# Patient Record
Sex: Female | Born: 1935 | Race: White | Hispanic: No | Marital: Married
Health system: Southern US, Community
[De-identification: ages and names within clinical notes are randomized; demographics above are authoritative.]

## PROBLEM LIST (undated history)

## (undated) DIAGNOSIS — N183 Chronic kidney disease, stage 3 unspecified: Secondary | ICD-10-CM

## (undated) DIAGNOSIS — I639 Cerebral infarction, unspecified: Secondary | ICD-10-CM

## (undated) DIAGNOSIS — Z9889 Other specified postprocedural states: Secondary | ICD-10-CM

## (undated) DIAGNOSIS — Z5189 Encounter for other specified aftercare: Secondary | ICD-10-CM

## (undated) DIAGNOSIS — H9190 Unspecified hearing loss, unspecified ear: Secondary | ICD-10-CM

## (undated) DIAGNOSIS — I10 Essential (primary) hypertension: Secondary | ICD-10-CM

## (undated) DIAGNOSIS — R112 Nausea with vomiting, unspecified: Secondary | ICD-10-CM

## (undated) DIAGNOSIS — E079 Disorder of thyroid, unspecified: Secondary | ICD-10-CM

## (undated) HISTORY — PX: THYROIDECTOMY, PARTIAL: SHX18

## (undated) HISTORY — PX: ABDOMINAL HYSTERECTOMY: SHX81

## (undated) HISTORY — DX: Unspecified hearing loss, unspecified ear: H91.90

---

## 1998-03-08 ENCOUNTER — Emergency Department (HOSPITAL_COMMUNITY): Admission: EM | Admit: 1998-03-08 | Discharge: 1998-03-08 | Payer: Self-pay

## 1998-03-09 ENCOUNTER — Ambulatory Visit (HOSPITAL_COMMUNITY): Admission: RE | Admit: 1998-03-09 | Discharge: 1998-03-09 | Payer: Self-pay | Admitting: Emergency Medicine

## 2001-06-15 ENCOUNTER — Other Ambulatory Visit: Admission: RE | Admit: 2001-06-15 | Discharge: 2001-06-15 | Payer: Self-pay | Admitting: Internal Medicine

## 2002-10-18 ENCOUNTER — Encounter: Admission: RE | Admit: 2002-10-18 | Discharge: 2002-10-18 | Payer: Self-pay | Admitting: Internal Medicine

## 2002-10-18 ENCOUNTER — Encounter: Payer: Self-pay | Admitting: Internal Medicine

## 2002-11-11 ENCOUNTER — Other Ambulatory Visit: Admission: RE | Admit: 2002-11-11 | Discharge: 2002-11-11 | Payer: Self-pay | Admitting: Internal Medicine

## 2002-11-22 ENCOUNTER — Encounter: Admission: RE | Admit: 2002-11-22 | Discharge: 2002-11-22 | Payer: Self-pay | Admitting: Internal Medicine

## 2002-11-22 ENCOUNTER — Encounter: Payer: Self-pay | Admitting: Internal Medicine

## 2003-11-06 ENCOUNTER — Encounter: Admission: RE | Admit: 2003-11-06 | Discharge: 2003-11-06 | Payer: Self-pay | Admitting: Orthopedic Surgery

## 2003-11-20 ENCOUNTER — Encounter: Admission: RE | Admit: 2003-11-20 | Discharge: 2003-11-20 | Payer: Self-pay | Admitting: Orthopedic Surgery

## 2006-01-07 ENCOUNTER — Emergency Department (HOSPITAL_COMMUNITY): Admission: EM | Admit: 2006-01-07 | Discharge: 2006-01-07 | Payer: Self-pay | Admitting: Emergency Medicine

## 2008-07-22 ENCOUNTER — Observation Stay (HOSPITAL_COMMUNITY): Admission: EM | Admit: 2008-07-22 | Discharge: 2008-07-23 | Payer: Self-pay | Admitting: Emergency Medicine

## 2008-07-27 ENCOUNTER — Emergency Department (HOSPITAL_COMMUNITY): Admission: EM | Admit: 2008-07-27 | Discharge: 2008-07-28 | Payer: Self-pay | Admitting: Emergency Medicine

## 2008-08-05 ENCOUNTER — Encounter: Admission: RE | Admit: 2008-08-05 | Discharge: 2008-08-05 | Payer: Self-pay | Admitting: Internal Medicine

## 2009-12-09 ENCOUNTER — Inpatient Hospital Stay (HOSPITAL_COMMUNITY): Admission: AD | Admit: 2009-12-09 | Discharge: 2009-12-12 | Payer: Self-pay | Admitting: Internal Medicine

## 2009-12-11 ENCOUNTER — Encounter (INDEPENDENT_AMBULATORY_CARE_PROVIDER_SITE_OTHER): Payer: Self-pay | Admitting: Internal Medicine

## 2009-12-11 ENCOUNTER — Other Ambulatory Visit: Payer: Self-pay | Admitting: Internal Medicine

## 2009-12-12 ENCOUNTER — Ambulatory Visit: Payer: Self-pay | Admitting: Gastroenterology

## 2010-10-30 ENCOUNTER — Emergency Department (HOSPITAL_COMMUNITY)
Admission: EM | Admit: 2010-10-30 | Discharge: 2010-10-30 | Payer: Self-pay | Source: Home / Self Care | Admitting: Emergency Medicine

## 2010-12-26 LAB — URINE MICROSCOPIC-ADD ON

## 2010-12-26 LAB — URINALYSIS, ROUTINE W REFLEX MICROSCOPIC
Glucose, UA: NEGATIVE mg/dL
Protein, ur: NEGATIVE mg/dL
Specific Gravity, Urine: 1.016 (ref 1.005–1.030)
Urobilinogen, UA: 0.2 mg/dL (ref 0.0–1.0)

## 2010-12-26 LAB — CBC
HCT: 43.5 % (ref 36.0–46.0)
Hemoglobin: 13.3 g/dL (ref 12.0–15.0)
MCHC: 33.1 g/dL (ref 30.0–36.0)
MCV: 90.3 fL (ref 78.0–100.0)
Platelets: 193 10*3/uL (ref 150–400)
RBC: 4.53 MIL/uL (ref 3.87–5.11)
RDW: 13.2 % (ref 11.5–15.5)
RDW: 13.5 % (ref 11.5–15.5)
WBC: 4 10*3/uL (ref 4.0–10.5)
WBC: 4 10*3/uL (ref 4.0–10.5)

## 2010-12-26 LAB — COMPREHENSIVE METABOLIC PANEL
ALT: 14 U/L (ref 0–35)
AST: 24 U/L (ref 0–37)
Albumin: 3.7 g/dL (ref 3.5–5.2)
Alkaline Phosphatase: 78 U/L (ref 39–117)
BUN: 13 mg/dL (ref 6–23)
BUN: 16 mg/dL (ref 6–23)
CO2: 22 mEq/L (ref 19–32)
CO2: 24 mEq/L (ref 19–32)
GFR calc Af Amer: 57 mL/min — ABNORMAL LOW (ref >60)
GFR calc non Af Amer: 55 mL/min — ABNORMAL LOW (ref >60)
Glucose, Bld: 92 mg/dL (ref 70–99)
Potassium: 3.5 mEq/L (ref 3.5–5.1)
Sodium: 140 mEq/L (ref 135–145)
Sodium: 142 mEq/L (ref 135–145)

## 2010-12-26 LAB — FOLATE RBC: RBC Folate: 507 ng/mL (ref 180–600)

## 2010-12-26 LAB — CLOSTRIDIUM DIFFICILE EIA
C difficile Toxins A+B, EIA: NEGATIVE
C difficile Toxins A+B, EIA: NEGATIVE

## 2010-12-26 LAB — DIFFERENTIAL
Basophils Relative: 1 % (ref 0–1)
Lymphocytes Relative: 28 % (ref 12–46)
Lymphs Abs: 1.1 10*3/uL (ref 0.7–4.0)
Monocytes Relative: 10 % (ref 3–12)
Neutro Abs: 2.4 10*3/uL (ref 1.7–7.7)

## 2010-12-26 LAB — VITAMIN B12: Vitamin B-12: 544 pg/mL (ref 211–911)

## 2010-12-26 LAB — FECAL LACTOFERRIN, QUANT: Fecal Lactoferrin: NEGATIVE

## 2010-12-26 LAB — STOOL CULTURE

## 2010-12-26 LAB — TSH: TSH: 0.973 u[IU]/mL (ref 0.350–4.500)

## 2011-02-15 NOTE — H&P (Signed)
[name], [name]                 ACCOUNT NO.:  [id number]   MEDICAL RECORD NO.:  [id number]          PATIENT TYPE:  OBV   LOCATION:  3715                         FACILITY:  [facility]   PHYSICIAN:  Massie Maroon, MD        DATE OF BIRTH:  04/25/1936   DATE OF ADMISSION:  07/22/2008  DATE OF DISCHARGE:                              HISTORY & PHYSICAL   CHIEF COMPLAINT:  Chest pain.   HISTORY OF PRESENT ILLNESS:  A 75 year old female with hypertension and  complains of chest pressure, substernal beginning about 3 days ago on  Sunday.  She awoke with this pressure.  The patient denies any radiation  to the neck or the arm.  She notes some shortness of breath associated  with this chest pressure.  The patient denied any fever, chills, cough,  heartburn, or diarrhea.  Pressure somewhat lessened over the course of  day.  The patient did not take anything for the chest pain.  The  following day, she had similar chest pressure.  She also noted having  some nausea.  She does note that her blood pressure has been running  higher lately and she was concerned about this.  However, she did not go  to the ER or seek any medical assistance.  The patient was continuing to  have symptoms and, so she came to outpatient clinic at [facility] today and was sent to the emergency room for further  evaluation.  EKG showed normal sinus rhythm at 67, normal axis, normal  intervals, no ST-T segment changes consistent with acute ischemia.  Troponin I was 0.01.  Chest x-ray was negative for any acute process and  CT angio chest was negative for pulmonary embolus.  The patient was  admitted for further evaluation of chest pain.   PAST MEDICAL HISTORY:  1. Hypertension.  2. Recurrent anxiety with depression.  3. Osteopenia, status post bone density test on April 16, 2008, with a      T-score negative 1.5 in the spine and negative 1.4 in the hips.  4. History of ovarian cyst, status post ultrasound  November 28, 1997,      which showed complex cystic masses in bilateral ovaries.  5. Diverticulosis  6. History of benign calcification in the superior right breast on      mammogram, January 07, 1999.  7. History of right breast ultrasound, which demonstrated benign-      appearing cyst at the 11 o'clock position on January 07, 1999.   1. History of gastric ulcer.  2. GERD.  3. Nephrolithiasis.  4. Varicose veins.   PAST SURGICAL HISTORY:  1. Partial thyroidectomy.  2. Total abdominal hysterectomy for ruptured uterus.   1. Bilateral ovariectomies for serous cyst adenofibroma.  2. Tonsillectomy.  3. Excision of left kidney stone in her 59s.  4. Vein stripping.  5. Excision of a birthmark on her back.   SOCIAL HISTORY:  The patient is married.  The patient was previously  employed as a [employer] operational officer.  The patient does not smoke or drink.  The  patient is a [employer].   FAMILY HISTORY:  Father deceased at age 26 of heart attack.  Mother  deceased from complications of stroke and had hypertension.  She also  had IHSS.  Siblings:  Sister had hypertension.  She has an adopted  grandson with attention deficit disorder.   REVIEW OF SYSTEMS:  Negative for all 10 organ systems except for  pertinent positives stated above.   HEALTH MAINTENANCE:  Tetanus November 25, 1997.  Colonoscopy, December 15, 1997, which showed a very tortuous sigmoid colon.  Otherwise  unremarkable.   PHYSICAL EXAMINATION:  VITAL SIGNS:  Temperature 98.7, weight 119  pounds, pulse 72, blood pressure 170/110, and pulse ox 97% on room air.  HEENT: Anicteric, EOMI, no nystagmus, pupils 1.5 mm, symmetric, direct,  consensual, near reflexes intact, tympanic membranes intact, and mucous  membranes moist.  NECK: No JVD, no bruit, no thyromegaly, and no adenopathy.  HEART: Regular rate and rhythm and S1-S2.  No murmurs, gallops, or rubs.  LUNGS: Clear to auscultation bilaterally.  ABDOMEN:  Soft, nontender, and nondistended.   Positive bowel sounds.  EXTREMITIES:  No cyanosis, clubbing, or edema.  NEURO:  Cranial nerves II through XII intact, reflexes 2+, symmetric,  diffuse with downgoing toes bilaterally, motor strength 5/5 in all 4  extremities, and pinprick intact.  SKIN: No rashes.  LYMPH NODES:  No adenopathy.   LABORATORY DATA:  WBC 8.1, hemoglobin 13.0, platelet count 230, PTT 27,  PT 12.4, INR 0.9, D-dimer 1.61 (elevated tracking), and troponin I 0.01.  Sodium 140, potassium 4.4, chloride 109, bicarb 22, BUN 25, creatinine  1.12, glucose 79, AST 25, ALT 10, total cholesterol 178, triglycerides  117, HDL 47, and LDL 108.   EKG normal sinus rhythm at 69, normal axis, normal intervals, no ST-T  segment changes consistent with acute ischemia.   Chest x-ray negative for any acute process.   CT angio chest negative for pulmonary embolus.   ASSESSMENT:  1. Chest pain.  2. Hypertension.  3. Gastroesophageal reflux disease/history of peptic ulcer disease.  4. Osteopenia.   PLAN:  The patient will be placed on telemetry.  We will check troponin  I q.8 h. x3 sets (first set done in the ER negative).  The patient will  be scheduled for an adenosine Myoview to rule out ischemic heart disease  if troponins are negative.  The patient will be placed on aspirin and  Lipitor 80 mg nightly.  We will continue with metoprolol at 12.5 mg  b.i.d. and use hydralazine 5 mg IV p.r.n. systolic blood pressure  greater than 160.      Massie Maroon, MD  Electronically Signed     JYK/MEDQ  D:  07/22/2008  T:  07/23/2008  Job:  [id number]

## 2011-02-15 NOTE — Discharge Summary (Signed)
[name], [name]                 ACCOUNT NO.:  [id number]   MEDICAL RECORD NO.:  [id number]          PATIENT TYPE:  OBV   LOCATION:  3715                         FACILITY:  [facility]   PHYSICIAN:  Amanda Maroon, [name]        DATE OF BIRTH:  1936/01/22   DATE OF ADMISSION:  07/22/2008  DATE OF DISCHARGE:  07/23/2008                               DISCHARGE SUMMARY   CONSULTATION:  John R. Tysinger, [name]   DISCHARGE DIAGNOSES:  1. Chest pain.  2. Hypertension, uncontrolled.  3. Gastroesophageal reflux disease.  4. Osteopenia.   DISCHARGE MEDICATIONS:  1. Pravastatin 40 mg QS.  2. Enteric-coated aspirin 81 mg daily.  3. Micardis/hydrochlorothiazide 80/25 mg 1 p.o. daily.  4. Metoprolol 25 mg one-half b.i.d.  5. Amlodipine 2.5 mg daily p.r.n. systolic blood pressure greater than      140.  6. Lyrica 50 mg t.i.d.  7. Prilosec 20 mg daily p.r.n. acid reflux.  8. Os-Cal Plus D (600 mg/400 International Units 1 p.o. b.i.d.).   CHIEF COMPLAINT:  Chest pain.   HISTORY OF PRESENT ILLNESS:  This is a 75 year old female with  hypertension who had complaints of chest pressure beginning 3 days prior  to admission.  She apparently woke with the pressure.  She denied any  radiation to the neck or arm.  She noted some shortness of breath  associated with the chest pressure.  The patient had denied any fever,  chills, cough, heartburn, diarrhea.  The patient did not apparently take  anything for the pain.  She did not go to the emergency room, though she  was apparently told to go by a physician at [facility].  She noted having  some nausea over the last few days as well.  She noted her blood  pressure had been running high lately and was concerned about this.  Because she continued to have symptoms, she came to Outpatient Clinic at  [facility] on July 22, 2008, and was sent to the  ER for evaluation.   The patient was admitted on July 22, 2008, for evaluation of chest  pain.   EKG showed normal sinus rhythm at 67, normal axis, normal  intervals, no ST-T segment changes consistent with acute ischemia.  Troponin initially was 0.01 and subsequent troponins were negative.  Chest x-ray was negative for any acute process.  CT angio chest done for  a positive D-dimer was negative for pulmonary embolus.  An adenosine  Myoview was performed on July 23, 2008, an ejection fraction was 70%  and there was no evidence of ischemia or infarct.  The patient was chest  free on July 23, 2008.  She will be discharged to home with further  evaluation by Dr. Aleen Campi as an outpatient.   PAST MEDICAL HISTORY/PAST SURGICAL HISTORY:  Reviewed, please see  history and physical on July 22, 2008.   SOCIAL HISTORY/FAMILY HISTORY:  Reviewed, see history and physical on  July 22, 2008.   PHYSICAL EXAMINATION:  VITAL SIGNS:  Temperature 97.3, pulse 70, blood  pressure 150/87, pulse ox 98% on  room air.  HEENT:  Anicteric.  NECK:  No JVD.  HEART:  Regular rate and rhythm, S1 and S2.  No murmurs, gallops, or  rubs.  LUNGS:  Clear to auscultation bilaterally.  ABDOMEN:  Soft, nontender, nondistended, positive bowel sounds.  EXTREMITIES:  No cyanosis, clubbing, or edema.  SKIN:  No rashes.  LYMPH NODES:  No adenopathy.  NEURO:  Nonfocal.   LABORATORY DATA:  Troponin I (1710, July 22, 2008, 0.01), troponin I  (July 23, 2008, 0010) 0.01. troponin I (July 23, 2008, 0815) 0.01.   Labs on July 22, 2008, total cholesterol 178, triglycerides 117, HDL  47, LDL 108.  Sodium 140, potassium 4.4, glucose 29, BUN 25, creatinine 1.12, AST 25,  ALT 10, PTT 27, PT 12.4, INR 0.09, D-dimer 1.61.  WBC 8.1, hemoglobin 13.0, platelet count 230.   ASSESSMENT:  1. Chest pain.  2. Hypertension.  3. Gastroesophageal reflux disease, history of peptic ulcer disease.  4. Osteopenia.  5. Fibromyalgia.   Chest pain workup was thus far negative.  The patient is chest pain free  at this  time.  She will have followup within 1 week with Dr. Aleen Campi  for further evaluation.  The patient will immediately call us and go to  the ER if she has any further chest pain or discomfort.  She should  follow up with Dr. Selena Batten in about 2 weeks for further evaluation of blood  pressure.  The patient was given a new prescription for amlodipine 2.5  mg daily, #30, refills 5.  She will start on this if her blood pressure  exceeds 140 and contact our office as well.      Amanda Maroon, [name]  Electronically Signed     JYK/MEDQ  D:  07/25/2008  T:  07/25/2008  Job:  [id number]

## 2011-07-05 LAB — CBC
HCT: 33.6 — ABNORMAL LOW
MCHC: 33.2
MCHC: 34.2
MCV: 90.5
Platelets: 228
RBC: 4.28
RDW: 13.3
WBC: 8.1

## 2011-07-05 LAB — COMPREHENSIVE METABOLIC PANEL
ALT: 10
AST: 25
Albumin: 3.1 — ABNORMAL LOW
BUN: 22
CO2: 22
Calcium: 10.1
Creatinine, Ser: 1.18
GFR calc Af Amer: 58 — ABNORMAL LOW
GFR calc non Af Amer: 48 — ABNORMAL LOW
Sodium: 140
Total Protein: 5.7 — ABNORMAL LOW
Total Protein: 7

## 2011-07-05 LAB — LIPID PANEL
HDL: 47
Triglycerides: 117
VLDL: 23

## 2011-07-05 LAB — TROPONIN I: Troponin I: 0.01

## 2011-07-05 LAB — PROTIME-INR: Prothrombin Time: 12.4

## 2012-01-10 ENCOUNTER — Other Ambulatory Visit: Payer: Self-pay

## 2012-01-10 ENCOUNTER — Encounter (HOSPITAL_COMMUNITY): Payer: Self-pay | Admitting: *Deleted

## 2012-01-10 ENCOUNTER — Emergency Department (HOSPITAL_COMMUNITY)
Admission: EM | Admit: 2012-01-10 | Discharge: 2012-01-10 | Disposition: A | Discharge status: Home / Self Care | Payer: BC Managed Care – PPO | Attending: Emergency Medicine | Admitting: Emergency Medicine

## 2012-01-10 ENCOUNTER — Emergency Department (HOSPITAL_COMMUNITY)
Admission: EM | Admit: 2012-01-10 | Discharge: 2012-01-10 | Disposition: A | Discharge status: Nursing Home (Includes ICF) | Payer: BC Managed Care – PPO | Source: Home / Self Care | Attending: Family Medicine | Admitting: Family Medicine

## 2012-01-10 ENCOUNTER — Emergency Department (HOSPITAL_COMMUNITY): Payer: BC Managed Care – PPO

## 2012-01-10 ENCOUNTER — Encounter (HOSPITAL_COMMUNITY): Payer: Self-pay | Admitting: Emergency Medicine

## 2012-01-10 DIAGNOSIS — R079 Chest pain, unspecified: Secondary | ICD-10-CM

## 2012-01-10 DIAGNOSIS — M25519 Pain in unspecified shoulder: Secondary | ICD-10-CM

## 2012-01-10 DIAGNOSIS — I10 Essential (primary) hypertension: Secondary | ICD-10-CM | POA: Insufficient documentation

## 2012-01-10 DIAGNOSIS — R0789 Other chest pain: Secondary | ICD-10-CM

## 2012-01-10 HISTORY — DX: Essential (primary) hypertension: I10

## 2012-01-10 HISTORY — DX: Disorder of thyroid, unspecified: E07.9

## 2012-01-10 LAB — BASIC METABOLIC PANEL
BUN: 16 mg/dL (ref 6–23)
CO2: 22 mEq/L (ref 19–32)
Calcium: 9.6 mg/dL (ref 8.4–10.5)
Chloride: 102 mEq/L (ref 96–112)
Creatinine, Ser: 0.89 mg/dL (ref 0.50–1.10)
GFR calc Af Amer: 72 mL/min — ABNORMAL LOW (ref >90)
GFR calc non Af Amer: 62 mL/min — ABNORMAL LOW (ref >90)
Glucose, Bld: 81 mg/dL (ref 70–99)
Potassium: 3.7 mEq/L (ref 3.5–5.1)
Sodium: 137 mEq/L (ref 135–145)

## 2012-01-10 LAB — CBC
HCT: 39 % (ref 36.0–46.0)
Hemoglobin: 13.2 g/dL (ref 12.0–15.0)
MCH: 30.3 pg (ref 26.0–34.0)
MCHC: 33.8 g/dL (ref 30.0–36.0)
MCV: 89.7 fL (ref 78.0–100.0)
Platelets: 262 10*3/uL (ref 150–400)
RBC: 4.35 MIL/uL (ref 3.87–5.11)
RDW: 13.6 % (ref 11.5–15.5)
WBC: 6.3 10*3/uL (ref 4.0–10.5)

## 2012-01-10 LAB — TROPONIN I: Troponin I: <0.3 ng/mL (ref <0.30)

## 2012-01-10 MED ORDER — OXYCODONE-ACETAMINOPHEN 5-325 MG PO TABS: 1.0000 | ORAL_TABLET | ORAL | Status: AC | PRN

## 2012-01-10 MED ORDER — ACETAMINOPHEN 325 MG PO TABS
650.0000 mg | ORAL_TABLET | Freq: Once | ORAL | Status: AC
  Administered 2012-01-10: 650 mg via ORAL
  Filled 2012-01-10: qty 2

## 2012-01-10 MED ORDER — NAPROXEN 500 MG PO TABS: 500.0000 mg | ORAL_TABLET | Freq: Two times a day (BID) | ORAL | Status: AC | PRN

## 2012-01-10 MED ORDER — ASPIRIN 81 MG PO CHEW
324.0000 mg | CHEWABLE_TABLET | Freq: Once | ORAL | Status: AC
  Administered 2012-01-10: 324 mg via ORAL
  Filled 2012-01-10: qty 4

## 2012-01-10 NOTE — ED Provider Notes (Addendum)
History     CSN: [id number]  Arrival date & time 01/10/12  0900   First MD Initiated Contact with Patient 01/10/12 1002      Chief Complaint  Patient presents with  . Shoulder Pain    (Consider location/radiation/quality/duration/timing/severity/associated sxs/prior treatment) HPI Comments: [name] presents for evaluation of left shoulder pain, that radiates down her upper arm, started on Saturday. She now reports radiation of the pain up her left side of her neck. She also reports some numbness and tingling down her left arm that has resolved. She reports that she was doing some yard work on Saturday but denies any injury. The pain is not exacerbated with movement. She also reports some nausea, and headaches; reports that her headaches are chronic. She denies any vomiting or visual changes. She denies any weakness. She denies any shortness of breath. She does report a history of hypertension. But denies any known coronary disease. She is followed by cardiologist as well as her primary care provider. She denies any catheterizations or echocardiograms; she does report a history of a chemical stress test. She also reports fluctuations in her blood pressures that can reach over 200 systolic blood pressure. ECG at the urgent care Center shows normal sinus rhythm, with a rate of 70 beats per minute, no ST or T wave changes. This is stable as compared to an earlier EKG of October 2009. Examination is unremarkable. Patient is a 76 y.o. female presenting with shoulder pain. The history is provided by the patient.  Shoulder Pain This is a new problem. The current episode started more than 2 days ago. The problem occurs constantly. The problem has not changed since onset.Associated symptoms include chest pain. Pertinent negatives include no abdominal pain, no headaches and no shortness of breath. The symptoms are aggravated by nothing. The symptoms are relieved by nothing. She has tried nothing for the symptoms.      Past Medical History  Diagnosis Date  . Hypertension   . Thyroid disease     Past Surgical History  Procedure Date  . Abdominal hysterectomy   . Thyroidectomy, partial     Family History  Problem Relation Age of Onset  . Stroke Mother     History  Substance Use Topics  . Smoking status: Never Smoker   . Smokeless tobacco: Not on file  . Alcohol Use: No    OB History    Grav Para Term Preterm Abortions TAB SAB Ect Mult Living                  Review of Systems  Constitutional: Negative.   HENT: Negative.   Eyes: Negative.   Respiratory: Negative.  Negative for shortness of breath.   Cardiovascular: Positive for chest pain.  Gastrointestinal: Negative.  Negative for abdominal pain.  Genitourinary: Negative.   Musculoskeletal: Positive for arthralgias.  Skin: Negative.   Neurological: Negative.  Negative for headaches.    Allergies  Review of patient's allergies indicates no known allergies.  Home Medications   Current Outpatient Rx  Name Route Sig Dispense Refill  . AMLODIPINE BESYLATE 5 MG PO TABS Oral Take 5 mg by mouth daily.    Marland Kitchen METOPROLOL TARTRATE 25 MG PO TABS Oral Take 25 mg by mouth 2 (two) times daily.      BP 173/81  Pulse 78  Temp(Src) 97.2 F (36.2 C) (Oral)  Resp 14  SpO2 96%  Physical Exam  Nursing note and vitals reviewed. Constitutional: She is oriented to person, place,  and time. She appears well-developed and well-nourished.  HENT:  Head: Normocephalic and atraumatic.  Eyes: EOM are normal.  Neck: Normal range of motion.  Cardiovascular: Normal rate, regular rhythm, S1 normal, S2 normal and normal heart sounds.   No murmur heard.      ECG: NSR, rate 70 bpm; no ST or T wave changes; stable compared to previous one from Oct 2009  Pulmonary/Chest: Effort normal. She has no decreased breath sounds. She has no wheezes. She has no rhonchi.  Musculoskeletal: Normal range of motion.  Neurological: She is alert and oriented to  person, place, and time.  Skin: Skin is warm and dry.  Psychiatric: Her behavior is normal.    ED Course  Procedures (including critical care time)  Labs Reviewed - No data to display No results found.   1. Chest pain radiating to arm       MDM  Transferred to Emergency Department        Renaee Munda, MD 01/10/12 1142  Renaee Munda, MD 01/10/12 1143

## 2012-01-10 NOTE — ED Notes (Signed)
Pt transferred from urgent care for further eval of left shoulder pain onset Saturday. Pt denies recent injury.

## 2012-01-10 NOTE — Discharge Instructions (Signed)
Transferred to the emergency department. For evaluation of chest discomfort, to rule out ACS.

## 2012-01-10 NOTE — Discharge Instructions (Signed)
Arthralgia Arthralgia is joint pain. A joint is a place where two bones meet. Joint pain can happen for many reasons. The joint can be bruised, stiff, infected, or weak from aging. Pain usually goes away after resting and taking medicine for soreness.  HOME CARE  Rest the joint as told by your doctor.   Keep the sore joint raised (elevated) for the first 24 hours.   Put ice on the joint area.   Put ice in a plastic bag.   Place a towel between your skin and the bag.   Leave the ice on for 15 to 20 minutes, 3 to 4 times a day.   Wear your splint, casting, elastic bandage, or sling as told by your doctor.   Only take medicine as told by your doctor. Do not take aspirin.   Use crutches as told by your doctor. Do not put weight on the joint until told to by your doctor.  GET HELP RIGHT AWAY IF:   You have bruising, puffiness (swelling), or more pain.   Your fingers or toes turn blue or start to lose feeling (numb).   Your medicine does not lessen the pain.   Your pain becomes severe.   You have a temperature by mouth above 102 F (38.9 C), not controlled by medicine.   You cannot move or use the joint.  MAKE SURE YOU:   Understand these instructions.   Will watch your condition.   Will get help right away if you are not doing well or get worse.  Document Released: 09/07/2009 Document Revised: 09/08/2011 Document Reviewed: 09/07/2009 Exit[employer] Patient Information 2012 [employer].Arthralgia Arthralgia is joint pain. A joint is a place where two bones meet. Joint pain can happen for many reasons. The joint can be bruised, stiff, infected, or weak from aging. Pain usually goes away after resting and taking medicine for soreness.  HOME CARE  Rest the joint as told by your doctor.   Keep the sore joint raised (elevated) for the first 24 hours.   Put ice on the joint area.   Put ice in a plastic bag.   Place a towel between your skin and the bag.   Leave the ice on  for 15 to 20 minutes, 3 to 4 times a day.   Wear your splint, casting, elastic bandage, or sling as told by your doctor.   Only take medicine as told by your doctor. Do not take aspirin.   Use crutches as told by your doctor. Do not put weight on the joint until told to by your doctor.  GET HELP RIGHT AWAY IF:   You have bruising, puffiness (swelling), or more pain.   Your fingers or toes turn blue or start to lose feeling (numb).   Your medicine does not lessen the pain.   Your pain becomes severe.   You have a temperature by mouth above 102 F (38.9 C), not controlled by medicine.   You cannot move or use the joint.  MAKE SURE YOU:   Understand these instructions.   Will watch your condition.   Will get help right away if you are not doing well or get worse.  Document Released: 09/07/2009 Document Revised: 09/08/2011 Document Reviewed: 09/07/2009 Exit[employer] Patient Information 2012 [employer]. 

## 2012-01-10 NOTE — ED Notes (Signed)
Pt updated on plan of care. Resting quietly at the time. No signs of distress noted.

## 2012-01-10 NOTE — ED Provider Notes (Signed)
History    76 year old female with left shoulder pain. Denies trauma. Gradual onset about 3 days ago and persistent. No appreciable exacerbating relieving factors. Will sometimes radiate into her left neck. No numbness, tingling or loss strength. No fevers or chills. No rash. No chest pain or shortness of breath. No history of similar pain. Patient denies history coronary artery disease. She does have a history of hypertension reports compliance with her medication. Reports a chemical stress test years ago which per her report was normal. CSN: [id number]  Arrival date & time 01/10/12  1137   First MD Initiated Contact with Patient 01/10/12 1159      Chief Complaint  Patient presents with  . Shoulder Pain    Left shoulder and arm pain    (Consider location/radiation/quality/duration/timing/severity/associated sxs/prior treatment) HPI  Past Medical History  Diagnosis Date  . Hypertension   . Thyroid disease     Past Surgical History  Procedure Date  . Abdominal hysterectomy   . Thyroidectomy, partial     Family History  Problem Relation Age of Onset  . Stroke Mother     History  Substance Use Topics  . Smoking status: Never Smoker   . Smokeless tobacco: Not on file  . Alcohol Use: No    OB History    Grav Para Term Preterm Abortions TAB SAB Ect Mult Living                  Review of Systems   Review of symptoms negative unless otherwise noted in HPI.   Allergies  Review of patient's allergies indicates no known allergies.  Home Medications   Current Outpatient Rx  Name Route Sig Dispense Refill  . AMLODIPINE BESYLATE 5 MG PO TABS Oral Take 5 mg by mouth daily.    . OMEGA-3 FATTY ACIDS 1000 MG PO CAPS Oral Take 1 g by mouth daily.    Marland Kitchen METOPROLOL TARTRATE 25 MG PO TABS Oral Take 25 mg by mouth 2 (two) times daily.      BP 149/78  Pulse 69  Temp(Src) 97.5 F (36.4 C) (Oral)  Resp 16  SpO2 99%  Physical Exam  Nursing note and vitals  reviewed. Constitutional: She appears well-developed and well-nourished. No distress.  HENT:  Head: Normocephalic and atraumatic.  Eyes: Conjunctivae are normal. Right eye exhibits no discharge. Left eye exhibits no discharge.  Neck: Neck supple.  Cardiovascular: Normal rate, regular rhythm and normal heart sounds.  Exam reveals no gallop and no friction rub.   No murmur heard. Pulmonary/Chest: Effort normal and breath sounds normal. No respiratory distress. She exhibits no tenderness.  Abdominal: Soft. She exhibits no distension. There is no tenderness.  Musculoskeletal: She exhibits no edema and no tenderness.       Shoulder grossly normal as compared to the right. There is no external signs of trauma. No effusion. No tenderness. No pain with range of motion. Neurovascular intact distally  Neurological: She is alert.  Skin: Skin is warm and dry.  Psychiatric: She has a normal mood and affect. Her behavior is normal. Thought content normal.    ED Course  Procedures (including critical care time)  Labs Reviewed  BASIC METABOLIC PANEL - Abnormal; Notable for the following:    GFR calc non Af Amer 62 (*)    GFR calc Af Amer 72 (*)    All other components within normal limits  TROPONIN I  CBC   No results found.  EKG:  Rhythm: Normal sinus Rate:  62 Axis: Left axis deviation Intervals/Conduction: Normal. Poor R wave progression ST segments: Nonspecific ST changes.   1. Shoulder pain       MDM  76 year old female with left shoulder pain. Consider anginal equivalent but doubt. This is atypical for ACS given location and constant nature for several days. EKG is non-provocative. Troponin is normal. There is no history of trauma. Doubt infectious. Consider musculoskeletal, but the pain is not significantly changed with range of motion or palpation. There are no overlying skin changes. Consider referred pain from diaphragmatic irritation. There is no history of abdominal trauma.  Patient has no abdominal pain. She has no pulmonary symptoms. It is nonpleuritic. Etiology of patient's complaints is not completely clear at this time, but have a low suspicion for emergent etiology. Strict return precautions were discussed. Pain medication as needed. Outpatient followup.        Raeford Razor, MD 01/10/12 [phone]

## 2012-01-10 NOTE — ED Notes (Signed)
Pt presents to department for evaluation of L shoulder pain. Onset Saturday. Pt was seen at [facility] this morning and sent here for further treatment. Denies recent injury. Describes pain as constant discomfort. Full ROM noted. Nothing makes pain worse. No obvious deformity noted. No bruising. No swelling. She is alert and oriented x4. Ambulatory to triage.

## 2012-01-10 NOTE — ED Notes (Signed)
4 days of left shoulder pain--unaware of any injury.  The pain woke her this am at 0130.

## 2013-03-07 ENCOUNTER — Other Ambulatory Visit: Payer: Self-pay | Admitting: Dermatology

## 2015-06-12 ENCOUNTER — Other Ambulatory Visit: Payer: Self-pay | Admitting: Gastroenterology

## 2015-06-12 DIAGNOSIS — R1013 Epigastric pain: Secondary | ICD-10-CM

## 2015-06-12 DIAGNOSIS — R110 Nausea: Secondary | ICD-10-CM

## 2015-06-29 ENCOUNTER — Ambulatory Visit (HOSPITAL_COMMUNITY)
Admission: RE | Admit: 2015-06-29 | Discharge: 2015-06-29 | Disposition: A | Discharge status: Home / Self Care | Payer: BC Managed Care – PPO | Source: Ambulatory Visit | Attending: Gastroenterology | Admitting: Gastroenterology

## 2015-06-29 DIAGNOSIS — R1013 Epigastric pain: Secondary | ICD-10-CM | POA: Diagnosis present

## 2015-06-29 DIAGNOSIS — K824 Cholesterolosis of gallbladder: Secondary | ICD-10-CM | POA: Insufficient documentation

## 2015-06-29 DIAGNOSIS — R110 Nausea: Secondary | ICD-10-CM

## 2015-06-29 DIAGNOSIS — N281 Cyst of kidney, acquired: Secondary | ICD-10-CM | POA: Diagnosis not present

## 2015-06-29 DIAGNOSIS — R934 Abnormal findings on diagnostic imaging of urinary organs: Secondary | ICD-10-CM | POA: Diagnosis not present

## 2015-06-29 MED ORDER — SINCALIDE 5 MCG IJ SOLR
INTRAMUSCULAR | Status: AC
  Administered 2015-06-29: 1 ug via INTRAVENOUS
  Filled 2015-06-29: qty 5

## 2015-06-29 MED ORDER — STERILE WATER FOR INJECTION IJ SOLN
INTRAMUSCULAR | Status: AC
  Filled 2015-06-29: qty 10

## 2015-06-29 MED ORDER — TECHNETIUM TC 99M MEBROFENIN IV KIT
5.0000 | PACK | Freq: Once | INTRAVENOUS | Status: DC | PRN
  Administered 2015-06-29: 5 via INTRAVENOUS
  Filled 2015-06-29: qty 6

## 2015-06-29 MED ORDER — SINCALIDE 5 MCG IJ SOLR
0.0200 ug/kg | Freq: Once | INTRAMUSCULAR | Status: AC
  Administered 2015-06-29: 1 ug via INTRAVENOUS

## 2015-07-07 ENCOUNTER — Other Ambulatory Visit: Payer: Self-pay | Admitting: Gastroenterology

## 2015-07-07 DIAGNOSIS — R110 Nausea: Secondary | ICD-10-CM

## 2015-07-22 ENCOUNTER — Ambulatory Visit (HOSPITAL_COMMUNITY)
Admission: RE | Admit: 2015-07-22 | Discharge: 2015-07-22 | Disposition: A | Discharge status: Home / Self Care | Payer: BC Managed Care – PPO | Source: Ambulatory Visit | Attending: Gastroenterology | Admitting: Gastroenterology

## 2015-07-22 DIAGNOSIS — R110 Nausea: Secondary | ICD-10-CM | POA: Insufficient documentation

## 2015-07-22 DIAGNOSIS — R109 Unspecified abdominal pain: Secondary | ICD-10-CM | POA: Insufficient documentation

## 2015-07-22 DIAGNOSIS — R140 Abdominal distension (gaseous): Secondary | ICD-10-CM | POA: Insufficient documentation

## 2015-07-22 MED ORDER — TECHNETIUM TC 99M SULFUR COLLOID
2.0000 | Freq: Once | INTRAVENOUS | Status: DC | PRN
  Administered 2015-07-22: 2 via ORAL
  Filled 2015-07-22: qty 2

## 2015-07-28 ENCOUNTER — Other Ambulatory Visit: Payer: Self-pay | Admitting: Gastroenterology

## 2015-07-28 DIAGNOSIS — R1084 Generalized abdominal pain: Secondary | ICD-10-CM

## 2015-07-28 DIAGNOSIS — R110 Nausea: Secondary | ICD-10-CM

## 2015-07-30 ENCOUNTER — Ambulatory Visit
Admission: RE | Admit: 2015-07-30 | Discharge: 2015-07-30 | Disposition: A | Discharge status: Home / Self Care | Payer: BC Managed Care – PPO | Source: Ambulatory Visit | Attending: Gastroenterology | Admitting: Gastroenterology

## 2015-07-30 DIAGNOSIS — R110 Nausea: Secondary | ICD-10-CM

## 2015-07-30 DIAGNOSIS — R1084 Generalized abdominal pain: Secondary | ICD-10-CM

## 2015-07-30 MED ORDER — IOPAMIDOL (ISOVUE-300) INJECTION 61%
100.0000 mL | Freq: Once | INTRAVENOUS | Status: AC | PRN
  Administered 2015-07-30: 100 mL via INTRAVENOUS

## 2015-07-31 ENCOUNTER — Other Ambulatory Visit: Payer: Self-pay | Admitting: General Surgery

## 2015-08-05 ENCOUNTER — Other Ambulatory Visit: Payer: Self-pay | Admitting: General Surgery

## 2015-08-07 ENCOUNTER — Encounter (HOSPITAL_COMMUNITY): Payer: Self-pay

## 2015-08-07 ENCOUNTER — Encounter (HOSPITAL_COMMUNITY)
Admission: RE | Admit: 2015-08-07 | Discharge: 2015-08-07 | Disposition: A | Discharge status: Home / Self Care | Payer: BC Managed Care – PPO | Source: Ambulatory Visit | Attending: General Surgery | Admitting: General Surgery

## 2015-08-07 DIAGNOSIS — Z01818 Encounter for other preprocedural examination: Secondary | ICD-10-CM | POA: Diagnosis present

## 2015-08-07 DIAGNOSIS — K811 Chronic cholecystitis: Secondary | ICD-10-CM | POA: Diagnosis not present

## 2015-08-07 DIAGNOSIS — I10 Essential (primary) hypertension: Secondary | ICD-10-CM | POA: Diagnosis not present

## 2015-08-07 DIAGNOSIS — Z79899 Other long term (current) drug therapy: Secondary | ICD-10-CM | POA: Insufficient documentation

## 2015-08-07 DIAGNOSIS — R001 Bradycardia, unspecified: Secondary | ICD-10-CM | POA: Insufficient documentation

## 2015-08-07 DIAGNOSIS — E079 Disorder of thyroid, unspecified: Secondary | ICD-10-CM | POA: Diagnosis not present

## 2015-08-07 DIAGNOSIS — Z01812 Encounter for preprocedural laboratory examination: Secondary | ICD-10-CM | POA: Diagnosis not present

## 2015-08-07 HISTORY — DX: Other specified postprocedural states: Z98.890

## 2015-08-07 HISTORY — DX: Nausea with vomiting, unspecified: R11.2

## 2015-08-07 LAB — PROTIME-INR
INR: 1 (ref 0.00–1.49)
Prothrombin Time: 13.4 seconds (ref 11.6–15.2)

## 2015-08-07 LAB — COMPREHENSIVE METABOLIC PANEL
ALBUMIN: 3.7 g/dL (ref 3.5–5.0)
ALK PHOS: 62 U/L (ref 38–126)
ALT: 15 U/L (ref 14–54)
AST: 19 U/L (ref 15–41)
Anion gap: 6 (ref 5–15)
BILIRUBIN TOTAL: 0.8 mg/dL (ref 0.3–1.2)
BUN: 23 mg/dL — AB (ref 6–20)
CALCIUM: 9.8 mg/dL (ref 8.9–10.3)
CO2: 25 mmol/L (ref 22–32)
CREATININE: 1.12 mg/dL — AB (ref 0.44–1.00)
Chloride: 108 mmol/L (ref 101–111)
GFR calc Af Amer: 53 mL/min — ABNORMAL LOW (ref >60)
GFR, EST NON AFRICAN AMERICAN: 46 mL/min — AB (ref >60)
GLUCOSE: 80 mg/dL (ref 65–99)
POTASSIUM: 4 mmol/L (ref 3.5–5.1)
Sodium: 139 mmol/L (ref 135–145)
TOTAL PROTEIN: 6.4 g/dL — AB (ref 6.5–8.1)

## 2015-08-07 LAB — CBC WITH DIFFERENTIAL/PLATELET
BASOS ABS: 0.1 10*3/uL (ref 0.0–0.1)
BASOS PCT: 2 %
Eosinophils Absolute: 0.2 10*3/uL (ref 0.0–0.7)
Eosinophils Relative: 4 %
HEMATOCRIT: 37.7 % (ref 36.0–46.0)
HEMOGLOBIN: 12.5 g/dL (ref 12.0–15.0)
LYMPHS PCT: 37 %
Lymphs Abs: 1.9 10*3/uL (ref 0.7–4.0)
MCH: 30.2 pg (ref 26.0–34.0)
MCHC: 33.2 g/dL (ref 30.0–36.0)
MCV: 91.1 fL (ref 78.0–100.0)
Monocytes Absolute: 0.5 10*3/uL (ref 0.1–1.0)
Monocytes Relative: 10 %
NEUTROS ABS: 2.4 10*3/uL (ref 1.7–7.7)
NEUTROS PCT: 47 %
Platelets: 230 10*3/uL (ref 150–400)
RBC: 4.14 MIL/uL (ref 3.87–5.11)
RDW: 13.9 % (ref 11.5–15.5)
WBC: 5.2 10*3/uL (ref 4.0–10.5)

## 2015-08-07 NOTE — Progress Notes (Signed)
Anesthesia Chart Review:  Pt is 79 year old female scheduled for laparoscopic cholecystectomy with intraoperative cholangiogram on 08/13/2015 with Dr. Abbey Chattersosenbower.   PMH includes: HTN, thyroid disease, post-op N/V. Never smoker. BMI 21.   Medications include: amlodipine, clonidine, metoprolol.   Preoperative labs reviewed.    EKG 08/07/15: Sinus bradycardia (57 bpm). Left ventricular hypertrophy with repolarization abnormality  Nuclear stress test 07/23/08: No evidence of ischemia or infarct. Ejection fraction 70%.  If no changes, I anticipate pt can proceed with surgery as scheduled.   Rica Mastngela Jhovany Weidinger, FNP-BC [facility] Phone: [phone] 08/07/2015 4:20 PM

## 2015-08-07 NOTE — Pre-Procedure Instructions (Signed)
    [name]  08/07/2015      [facility] - [place], [place] - [id number] [place] [id number] Phone: [phone] Fax: [phone]    Your procedure is scheduled on Thursday Aug 13, 2015.  [facility] Admitting at 12:30 P.M.  Call this number if you have problems the morning of surgery:  [phone]   Remember:  Do not eat food or drink liquids after midnight.  Take these medicines the morning of surgery with A SIP OF WATER Amlodipine, Clonidine, Lopressor  Discontinue taking all herbal supplements and vitamins, aspirin products, ibuprofen containing products and naproxen containing products five days prior to surgery.  Do not wear jewelry, make-up or nail polish.  Do not wear lotions, powders, or perfumes.  You may wear deodorant.  Do not shave 48 hours prior to surgery.   Do not bring valuables to the hospital.  [facility] is not responsible for any belongings or valuables.  Contacts, dentures or bridgework may not be worn into surgery.  Leave your suitcase in the car.  After surgery it may be brought to your room.  For patients admitted to the hospital, discharge time will be determined by your treatment team.  Patients discharged the day of surgery will not be allowed to drive home.     Please read over the following fact sheets that you were given. Pain Booklet, Coughing and Deep Breathing and Surgical Site Infection Prevention

## 2015-08-10 ENCOUNTER — Other Ambulatory Visit: Payer: Self-pay | Admitting: General Surgery

## 2015-08-13 ENCOUNTER — Ambulatory Visit (HOSPITAL_COMMUNITY): Payer: BC Managed Care – PPO

## 2015-08-13 ENCOUNTER — Encounter (HOSPITAL_COMMUNITY): Payer: Self-pay | Admitting: *Deleted

## 2015-08-13 ENCOUNTER — Ambulatory Visit (HOSPITAL_COMMUNITY)
Admission: RE | Admit: 2015-08-13 | Discharge: 2015-08-13 | Disposition: A | Discharge status: Home / Self Care | Payer: BC Managed Care – PPO | Source: Ambulatory Visit | Attending: General Surgery | Admitting: General Surgery

## 2015-08-13 ENCOUNTER — Ambulatory Visit (HOSPITAL_COMMUNITY): Payer: BC Managed Care – PPO | Admitting: Emergency Medicine

## 2015-08-13 ENCOUNTER — Ambulatory Visit (HOSPITAL_COMMUNITY): Payer: BC Managed Care – PPO | Admitting: Certified Registered Nurse Anesthetist

## 2015-08-13 ENCOUNTER — Encounter (HOSPITAL_COMMUNITY)
Admission: RE | Disposition: A | Discharge status: Home / Self Care | Payer: Self-pay | Source: Ambulatory Visit | Attending: General Surgery

## 2015-08-13 DIAGNOSIS — Z79899 Other long term (current) drug therapy: Secondary | ICD-10-CM | POA: Diagnosis not present

## 2015-08-13 DIAGNOSIS — R1011 Right upper quadrant pain: Secondary | ICD-10-CM | POA: Diagnosis present

## 2015-08-13 DIAGNOSIS — K811 Chronic cholecystitis: Secondary | ICD-10-CM | POA: Insufficient documentation

## 2015-08-13 DIAGNOSIS — I10 Essential (primary) hypertension: Secondary | ICD-10-CM | POA: Diagnosis not present

## 2015-08-13 HISTORY — PX: CHOLECYSTECTOMY: SHX55

## 2015-08-13 SURGERY — LAPAROSCOPIC CHOLECYSTECTOMY WITH INTRAOPERATIVE CHOLANGIOGRAM
Anesthesia: General | Site: Abdomen

## 2015-08-13 MED ORDER — LIDOCAINE HCL (CARDIAC) 20 MG/ML IV SOLN
INTRAVENOUS | Status: AC
  Filled 2015-08-13: qty 5

## 2015-08-13 MED ORDER — METOPROLOL TARTRATE 12.5 MG HALF TABLET
25.0000 mg | ORAL_TABLET | Freq: Once | ORAL | Status: AC
  Administered 2015-08-13: 25 mg via ORAL

## 2015-08-13 MED ORDER — BUPIVACAINE-EPINEPHRINE (PF) 0.25% -1:200000 IJ SOLN
INTRAMUSCULAR | Status: AC
  Filled 2015-08-13: qty 30

## 2015-08-13 MED ORDER — ONDANSETRON HCL 4 MG/2ML IJ SOLN
4.0000 mg | Freq: Once | INTRAMUSCULAR | Status: AC
  Administered 2015-08-13: 4 mg via INTRAVENOUS

## 2015-08-13 MED ORDER — ONDANSETRON HCL 4 MG/2ML IJ SOLN
INTRAMUSCULAR | Status: AC
  Administered 2015-08-13: 4 mg
  Filled 2015-08-13: qty 2

## 2015-08-13 MED ORDER — AMLODIPINE BESYLATE 10 MG PO TABS
10.0000 mg | ORAL_TABLET | Freq: Once | ORAL | Status: AC
  Administered 2015-08-13: 10 mg via ORAL
  Filled 2015-08-13: qty 1

## 2015-08-13 MED ORDER — IOHEXOL 300 MG/ML  SOLN
INTRAMUSCULAR | Status: DC | PRN
  Administered 2015-08-13: 11 mL

## 2015-08-13 MED ORDER — DEXAMETHASONE SODIUM PHOSPHATE 4 MG/ML IJ SOLN
INTRAMUSCULAR | Status: DC | PRN
  Administered 2015-08-13: 4 mg via INTRAVENOUS

## 2015-08-13 MED ORDER — CLONIDINE HCL 0.1 MG PO TABS
0.1000 mg | ORAL_TABLET | Freq: Once | ORAL | Status: AC
  Administered 2015-08-13: 0.1 mg via ORAL
  Filled 2015-08-13: qty 1

## 2015-08-13 MED ORDER — MIDAZOLAM HCL 2 MG/2ML IJ SOLN: 0.5000 mg | Freq: Once | INTRAMUSCULAR | Status: DC | PRN

## 2015-08-13 MED ORDER — SODIUM CHLORIDE 0.9 % IR SOLN
Status: DC | PRN
  Administered 2015-08-13: 1000 mL

## 2015-08-13 MED ORDER — ONDANSETRON HCL 4 MG PO TABS: 4.0000 mg | ORAL_TABLET | Freq: Three times a day (TID) | ORAL | Status: DC | PRN

## 2015-08-13 MED ORDER — MIDAZOLAM HCL 2 MG/2ML IJ SOLN
INTRAMUSCULAR | Status: AC
  Filled 2015-08-13: qty 4

## 2015-08-13 MED ORDER — PROPOFOL 10 MG/ML IV BOLUS
INTRAVENOUS | Status: AC
  Filled 2015-08-13: qty 20

## 2015-08-13 MED ORDER — EPHEDRINE SULFATE 50 MG/ML IJ SOLN
INTRAMUSCULAR | Status: AC
  Filled 2015-08-13: qty 1

## 2015-08-13 MED ORDER — GLYCOPYRROLATE 0.2 MG/ML IJ SOLN
INTRAMUSCULAR | Status: DC | PRN
  Administered 2015-08-13: 0.4 mg via INTRAVENOUS

## 2015-08-13 MED ORDER — GLYCOPYRROLATE 0.2 MG/ML IJ SOLN
INTRAMUSCULAR | Status: AC
  Filled 2015-08-13: qty 2

## 2015-08-13 MED ORDER — BUPIVACAINE-EPINEPHRINE 0.25% -1:200000 IJ SOLN
INTRAMUSCULAR | Status: DC | PRN
  Administered 2015-08-13: 13 mL

## 2015-08-13 MED ORDER — MEPERIDINE HCL 25 MG/ML IJ SOLN: 6.2500 mg | INTRAMUSCULAR | Status: DC | PRN

## 2015-08-13 MED ORDER — CEFAZOLIN SODIUM-DEXTROSE 2-3 GM-% IV SOLR
2.0000 g | INTRAVENOUS | Status: AC
  Administered 2015-08-13: 2 g via INTRAVENOUS

## 2015-08-13 MED ORDER — HYDROMORPHONE HCL 1 MG/ML IJ SOLN
INTRAMUSCULAR | Status: AC
  Filled 2015-08-13: qty 1

## 2015-08-13 MED ORDER — SUCCINYLCHOLINE CHLORIDE 20 MG/ML IJ SOLN
INTRAMUSCULAR | Status: DC | PRN
  Administered 2015-08-13: 100 mg via INTRAVENOUS

## 2015-08-13 MED ORDER — FENTANYL CITRATE (PF) 250 MCG/5ML IJ SOLN
INTRAMUSCULAR | Status: AC
  Filled 2015-08-13: qty 5

## 2015-08-13 MED ORDER — ONDANSETRON HCL 4 MG/2ML IJ SOLN
INTRAMUSCULAR | Status: AC
  Filled 2015-08-13: qty 2

## 2015-08-13 MED ORDER — METOPROLOL TARTRATE 12.5 MG HALF TABLET
ORAL_TABLET | ORAL | Status: AC
  Administered 2015-08-13: 25 mg via ORAL
  Filled 2015-08-13: qty 2

## 2015-08-13 MED ORDER — NEOSTIGMINE METHYLSULFATE 10 MG/10ML IV SOLN
INTRAVENOUS | Status: DC | PRN
Start: 2015-08-13 — End: 2015-08-13
  Administered 2015-08-13: 3 mg via INTRAVENOUS

## 2015-08-13 MED ORDER — LACTATED RINGERS IV SOLN
INTRAVENOUS | Status: DC
  Administered 2015-08-13 (×2): via INTRAVENOUS

## 2015-08-13 MED ORDER — PROPOFOL 10 MG/ML IV BOLUS
INTRAVENOUS | Status: DC | PRN
  Administered 2015-08-13: 80 mg via INTRAVENOUS
  Administered 2015-08-13: 40 mg via INTRAVENOUS

## 2015-08-13 MED ORDER — ROCURONIUM BROMIDE 50 MG/5ML IV SOLN
INTRAVENOUS | Status: AC
  Filled 2015-08-13: qty 1

## 2015-08-13 MED ORDER — EPHEDRINE SULFATE 50 MG/ML IJ SOLN
INTRAMUSCULAR | Status: DC | PRN
  Administered 2015-08-13: 5 mg via INTRAVENOUS

## 2015-08-13 MED ORDER — NEOSTIGMINE METHYLSULFATE 10 MG/10ML IV SOLN
INTRAVENOUS | Status: AC
  Filled 2015-08-13: qty 1

## 2015-08-13 MED ORDER — ONDANSETRON HCL 4 MG/2ML IJ SOLN
INTRAMUSCULAR | Status: DC | PRN
  Administered 2015-08-13: 4 mg via INTRAVENOUS

## 2015-08-13 MED ORDER — ONDANSETRON HCL 4 MG/2ML IJ SOLN: 4.0000 mg | Freq: Once | INTRAMUSCULAR | Status: DC

## 2015-08-13 MED ORDER — CEFAZOLIN SODIUM-DEXTROSE 2-3 GM-% IV SOLR
INTRAVENOUS | Status: AC
  Filled 2015-08-13: qty 50

## 2015-08-13 MED ORDER — ARTIFICIAL TEARS OP OINT
TOPICAL_OINTMENT | OPHTHALMIC | Status: DC | PRN
  Administered 2015-08-13: 1 via OPHTHALMIC

## 2015-08-13 MED ORDER — LIDOCAINE HCL (CARDIAC) 20 MG/ML IV SOLN
INTRAVENOUS | Status: DC | PRN
  Administered 2015-08-13: 20 mg via INTRAVENOUS

## 2015-08-13 MED ORDER — FENTANYL CITRATE (PF) 100 MCG/2ML IJ SOLN
INTRAMUSCULAR | Status: DC | PRN
  Administered 2015-08-13 (×2): 25 ug via INTRAVENOUS
  Administered 2015-08-13: 50 ug via INTRAVENOUS
  Administered 2015-08-13 (×2): 25 ug via INTRAVENOUS
  Administered 2015-08-13: 100 ug via INTRAVENOUS

## 2015-08-13 MED ORDER — PROMETHAZINE HCL 25 MG/ML IJ SOLN
6.2500 mg | INTRAMUSCULAR | Status: DC | PRN
Start: 2015-08-13 — End: 2015-08-13

## 2015-08-13 MED ORDER — HYDROCODONE-ACETAMINOPHEN 5-325 MG PO TABS: 1.0000 | ORAL_TABLET | ORAL | Status: DC | PRN

## 2015-08-13 MED ORDER — ROCURONIUM BROMIDE 100 MG/10ML IV SOLN
INTRAVENOUS | Status: DC | PRN
  Administered 2015-08-13: 25 mg via INTRAVENOUS

## 2015-08-13 MED ORDER — ONDANSETRON HCL 4 MG/2ML IJ SOLN
INTRAMUSCULAR | Status: AC
  Filled 2015-08-13: qty 4

## 2015-08-13 MED ORDER — SUCCINYLCHOLINE CHLORIDE 20 MG/ML IJ SOLN
INTRAMUSCULAR | Status: AC
  Filled 2015-08-13: qty 1

## 2015-08-13 MED ORDER — 0.9 % SODIUM CHLORIDE (POUR BTL) OPTIME
TOPICAL | Status: DC | PRN
  Administered 2015-08-13: 1000 mL

## 2015-08-13 MED ORDER — HYDROMORPHONE HCL 1 MG/ML IJ SOLN
0.2500 mg | INTRAMUSCULAR | Status: DC | PRN
  Administered 2015-08-13: 0.25 mg via INTRAVENOUS

## 2015-08-13 MED ORDER — STERILE WATER FOR INJECTION IJ SOLN
INTRAMUSCULAR | Status: AC
  Filled 2015-08-13: qty 10

## 2015-08-13 SURGICAL SUPPLY — 48 items
APL SKNCLS STERI-STRIP NONHPOA (GAUZE/BANDAGES/DRESSINGS) ×1
APPLIER CLIP 5 13 M/L LIGAMAX5 (MISCELLANEOUS) ×3
APR CLP MED LRG 5 ANG JAW (MISCELLANEOUS) ×1
BAG SPEC RTRVL LRG 6X4 10 (ENDOMECHANICALS) ×1
BENZOIN TINCTURE PRP APPL 2/3 (GAUZE/BANDAGES/DRESSINGS) ×3 IMPLANT
CANISTER SUCTION 2500CC (MISCELLANEOUS) ×3 IMPLANT
CATH REDDICK CHOLANGI 4FR 50CM (CATHETERS) ×3 IMPLANT
CHLORAPREP W/TINT 26ML (MISCELLANEOUS) ×3 IMPLANT
CLIP APPLIE 5 13 M/L LIGAMAX5 (MISCELLANEOUS) ×1 IMPLANT
CLOSURE WOUND 1/2 X4 (GAUZE/BANDAGES/DRESSINGS) ×1
COVER MAYO STAND STRL (DRAPES) ×3 IMPLANT
COVER SURGICAL LIGHT HANDLE (MISCELLANEOUS) ×3 IMPLANT
DECANTER SPIKE VIAL GLASS SM (MISCELLANEOUS) ×3 IMPLANT
DRAPE C-ARM 42X72 X-RAY (DRAPES) ×3 IMPLANT
DRSG TEGADERM 2-3/8X2-3/4 SM (GAUZE/BANDAGES/DRESSINGS) ×12 IMPLANT
ELECT REM PT RETURN 9FT ADLT (ELECTROSURGICAL) ×3
ELECTRODE REM PT RTRN 9FT ADLT (ELECTROSURGICAL) ×1 IMPLANT
GAUZE SPONGE 2X2 8PLY STRL LF (GAUZE/BANDAGES/DRESSINGS) ×1 IMPLANT
GLOVE BIO SURGEON STRL SZ 6.5 (GLOVE) ×1 IMPLANT
GLOVE BIO SURGEON STRL SZ7 (GLOVE) ×2 IMPLANT
GLOVE BIO SURGEONS STRL SZ 6.5 (GLOVE) ×1
GLOVE BIOGEL PI IND STRL 7.0 (GLOVE) IMPLANT
GLOVE BIOGEL PI IND STRL 8 (GLOVE) ×1 IMPLANT
GLOVE BIOGEL PI INDICATOR 7.0 (GLOVE) ×2
GLOVE BIOGEL PI INDICATOR 8 (GLOVE) ×2
GLOVE ECLIPSE 8.0 STRL XLNG CF (GLOVE) ×3 IMPLANT
GOWN STRL REUS W/ TWL LRG LVL3 (GOWN DISPOSABLE) ×3 IMPLANT
GOWN STRL REUS W/TWL LRG LVL3 (GOWN DISPOSABLE) ×9
IV CATH 14GX2 1/4 (CATHETERS) ×3 IMPLANT
KIT BASIN OR (CUSTOM PROCEDURE TRAY) ×3 IMPLANT
KIT ROOM TURNOVER OR (KITS) ×3 IMPLANT
NS IRRIG 1000ML POUR BTL (IV SOLUTION) ×3 IMPLANT
PAD ARMBOARD 7.5X6 YLW CONV (MISCELLANEOUS) ×3 IMPLANT
POUCH SPECIMEN RETRIEVAL 10MM (ENDOMECHANICALS) ×3 IMPLANT
SCISSORS LAP 5X35 DISP (ENDOMECHANICALS) ×3 IMPLANT
SET IRRIG TUBING LAPAROSCOPIC (IRRIGATION / IRRIGATOR) ×3 IMPLANT
SLEEVE ENDOPATH XCEL 5M (ENDOMECHANICALS) ×6 IMPLANT
SPECIMEN JAR SMALL (MISCELLANEOUS) ×3 IMPLANT
SPONGE GAUZE 2X2 STER 10/PKG (GAUZE/BANDAGES/DRESSINGS) ×2
STRIP CLOSURE SKIN 1/2X4 (GAUZE/BANDAGES/DRESSINGS) ×2 IMPLANT
SUT MON AB 4-0 PC3 18 (SUTURE) ×3 IMPLANT
TOWEL OR 17X24 6PK STRL BLUE (TOWEL DISPOSABLE) ×3 IMPLANT
TOWEL OR 17X26 10 PK STRL BLUE (TOWEL DISPOSABLE) ×3 IMPLANT
TRAY LAPAROSCOPIC MC (CUSTOM PROCEDURE TRAY) ×3 IMPLANT
TROCAR XCEL BLUNT TIP 100MML (ENDOMECHANICALS) ×3 IMPLANT
TROCAR XCEL NON-BLD 11X100MML (ENDOMECHANICALS) IMPLANT
TROCAR XCEL NON-BLD 5MMX100MML (ENDOMECHANICALS) ×3 IMPLANT
TUBING INSUFFLATION (TUBING) ×3 IMPLANT

## 2015-08-13 NOTE — Transfer of Care (Signed)
Immediate Anesthesia Transfer of Care Note  Patient: [name]  Procedure(s) Performed: Procedure(s): LAPAROSCOPIC CHOLECYSTECTOMY WITH INTRAOPERATIVE CHOLANGIOGRAM (N/A)  Patient Location: PACU  Anesthesia Type:General  Level of Consciousness: patient cooperative and responds to stimulation  Airway & Oxygen Therapy: Patient Spontanous Breathing and Patient connected to nasal cannula oxygen  Post-op Assessment: Report given to RN and Post -op Vital signs reviewed and stable  Post vital signs: Reviewed and stable  Last Vitals:  Filed Vitals:   08/13/15 1445  BP:   Pulse:   Temp: 36.3 C  Resp:     Complications: No apparent anesthesia complications

## 2015-08-13 NOTE — Anesthesia Procedure Notes (Signed)
Procedure Name: Intubation Performed by: Everlene BallsHAYES, Macklen Wilhoite T Pre-anesthesia Checklist: Patient identified, Emergency Drugs available, Suction available, Patient being monitored and Timeout performed Patient Re-evaluated:Patient Re-evaluated prior to inductionOxygen Delivery Method: Circle system utilized Preoxygenation: Pre-oxygenation with 100% oxygen Intubation Type: IV induction Ventilation: Mask ventilation without difficulty Laryngoscope Size: Mac and 3 Grade View: Grade II Tube type: Oral Tube size: 7.0 mm Number of attempts: 1 Airway Equipment and Method: Stylet Placement Confirmation: ETT inserted through vocal cords under direct vision,  breath sounds checked- equal and bilateral and positive ETCO2 Secured at: 21 cm Tube secured with: Tape Dental Injury: Teeth and Oropharynx as per pre-operative assessment

## 2015-08-13 NOTE — Discharge Instructions (Signed)
LAPAROSCOPIC SURGERY: POST OP INSTRUCTIONS  1. DIET: Follow a liquid diet the first 2 days after arrival home, such as soup, liquids, crackers, etc.  Be sure to include lots of fluids daily.  Avoid fast food or heavy meals as your are more likely to get nauseated.  Eat a low fat after that.   2. Take your usually prescribed home medications unless otherwise directed. 3. PAIN CONTROL: a. Pain is best controlled by a usual combination of three different methods TOGETHER: i. Ice/Heat ii. Over the counter pain medication iii. Prescription pain medication b. Most patients will experience some swelling and bruising around the incisions.  Ice packs or heating pads (30-60 minutes up to 6 times a day) will help. Use ice for the first few days to help decrease swelling and bruising, then switch to heat to help relax tight/sore spots and speed recovery.  Some people prefer to use ice alone, heat alone, alternating between ice & heat.  Experiment to what works for you.  Swelling and bruising can take several weeks to resolve.   c. It is helpful to take an over-the-counter pain medication regularly for the first few weeks.  Choose one of the following that works best for you: i. Naproxen (Aleve, etc)  Two  tabs twice a day ii. Ibuprofen (Advil, etc) Three  tabs four times a day (every meal & bedtime) iii. Acetaminophen (Tylenol, etc) 500-650mg  four times a day (every meal & bedtime) d. A  prescription for pain medication (such as oxycodone, hydrocodone, etc) should be given to you upon discharge.  Take your pain medication as prescribed.  i. If you are having problems/concerns with the prescription medicine (does not control pain, nausea, vomiting, rash, itching, etc), please call us [phone] to see if we need to switch you to a different pain medicine that will work better for you and/or control your side effect better. ii. If you need a refill on your pain medication, please contact your  pharmacy.  They will contact our office to request authorization. Prescriptions will not be filled after 5 pm or on week-ends. 4. Avoid getting constipated.  Between the surgery and the pain medications, it is common to experience some constipation.  Increasing fluid intake and taking a fiber supplement (such as Metamucil, Citrucel, FiberCon, MiraLax, etc) 1-2 times a day regularly will usually help prevent this problem from occurring.  A mild laxative (prune juice, Milk of Magnesia, MiraLax, etc) should be taken according to package directions if there are no bowel movements after 48 hours.   5. Watch out for diarrhea.  If you have many loose bowel movements, simplify your diet to bland foods & liquids for a few days.  Stop any stool softeners and decrease your fiber supplement.  Switching to mild anti-diarrheal medications (Kayopectate, Pepto Bismol) can help.  If this worsens or does not improve, please call us. 6. Wash / shower every day.  You may shower over the dressings as they are waterproof.  Continue to shower over incision(s) after the dressing is off. 7. Remove your waterproof bandages 3 days after surgery.  You may leave the incision open to air.  You may replace a dressing/Band-Aid to cover the incision for comfort if you wish.  8. ACTIVITIES as tolerated:   a. You may resume regular (light) daily activities beginning the next day--such as daily self-care, walking, climbing stairs--gradually increasing light activities as tolerated.  No heavy lifting (over 10 pounds), straining, or intense activities for 2 weeks.  b. DO NOT PUSH THROUGH PAIN.  Let pain be your guide: If it hurts to do something, don't do it.  Pain is your body warning you to avoid that activity for another week until the pain goes down. c. You may drive when you are no longer taking prescription pain medication, you can comfortably wear a seatbelt, and you can safely maneuver your car and apply brakes. d. Bonita QuinYou may have sexual  intercourse when it is comfortable.  9. FOLLOW UP in our office a. Please call [facility] at [phone] to set up an appointment to see your surgeon in the office for a follow-up appointment approximately 2-3 weeks after your surgery. b. Make sure that you call for this appointment the day you arrive home to insure a convenient appointment time. 10. IF YOU HAVE DISABILITY OR FAMILY LEAVE FORMS, BRING THEM TO THE OFFICE FOR PROCESSING.  DO NOT GIVE THEM TO YOUR DOCTOR.  11.  Return to work/school:  Desk work/light activities in 5-7 days, full duty/activities in 2 weeks if pain-free.   WHEN TO CALL US [phone]) [phone]: 1. Poor pain control 2. Reactions / problems with new medications (rash/itching, nausea, etc)  3. Fever over 101.5 F (38.5 C) 4. Inability to urinate 5. Nausea and/or vomiting 6. Worsening swelling or bruising 7. Continued bleeding from incision. 8. Increased pain, redness, or drainage from the incision   The clinic staff is available to answer your questions during regular business hours (8:30am-5pm).  Please dont hesitate to call and ask to speak to one of our nurses for clinical concerns.   If you have a medical emergency, go to the nearest emergency room or call 911.  A surgeon from [facility] is always on call at the [facility]   [facility], [place] [address], [address], [place], [place]  [id number] ? MAIN: [phone] ? TOLL FREE: [phone] ?  FAX [phone] [email or web address]

## 2015-08-13 NOTE — Progress Notes (Signed)
Dr. Jean RosenthalJackson called and informed of elevated blood pressure, orders received to give amlodipine, clonidine and lopressor. Pharmacy called to send meds ASAP as OR is ready for pt, will monitor.

## 2015-08-13 NOTE — Interval H&P Note (Signed)
History and Physical Interval Note:  08/13/2015 1:22 PM  Amanda HeldAnn V Suydam  has presented today for surgery, with the diagnosis of Chronic Cholecystitis  The various methods of treatment have been discussed with the patient and family. After consideration of risks, benefits and other options for treatment, the patient has consented to  Procedure(s): LAPAROSCOPIC CHOLECYSTECTOMY WITH INTRAOPERATIVE CHOLANGIOGRAM (N/A) as a surgical intervention .  The patient's history has been reviewed, patient examined, no change in status, stable for surgery.  I have reviewed the patient's chart and labs.  Questions were answered to the patient's satisfaction.     Shomari Matusik JShela Commons

## 2015-08-13 NOTE — Anesthesia Postprocedure Evaluation (Signed)
  Anesthesia Post-op Note  Patient: Amanda HeldAnn V Gross  Procedure(s) Performed: Procedure(s): LAPAROSCOPIC CHOLECYSTECTOMY WITH INTRAOPERATIVE CHOLANGIOGRAM (N/A)  Patient Location: PACU  Anesthesia Type:General  Level of Consciousness: awake, alert , oriented and patient cooperative  Airway and Oxygen Therapy: Patient Spontanous Breathing  Post-op Pain: mild  Post-op Assessment: Post-op Vital signs reviewed, Patient's Cardiovascular Status Stable, Respiratory Function Stable, Patent Airway, No signs of Nausea or vomiting and Pain level controlled              Post-op Vital Signs: Reviewed and stable  Last Vitals:  Filed Vitals:   08/13/15 1545  BP: 171/61  Pulse: 52  Temp:   Resp: 14    Complications: No apparent anesthesia complications

## 2015-08-13 NOTE — Anesthesia Preprocedure Evaluation (Addendum)
Anesthesia Evaluation  Patient identified by MRN, date of birth, ID band Patient awake    Reviewed: Allergy & Precautions, NPO status , Patient's Chart, lab work & pertinent test results  History of Anesthesia Complications Negative for: history of anesthetic complications  Airway Mallampati: II  TM Distance: >3 FB Neck ROM: Full    Dental  (+) Dental Advisory Given, Teeth Intact   Pulmonary neg pulmonary ROS,    breath sounds clear to auscultation       Cardiovascular hypertension, Pt. on medications and Pt. on home beta blockers (-) angina Rhythm:Regular Rate:Normal  '09 Stress test: normal perfusion without ischemia, EF 70%   Neuro/Psych negative neurological ROS     GI/Hepatic Neg liver ROS, N/v with acute chole   Endo/Other  negative endocrine ROS  Renal/GU negative Renal ROS     Musculoskeletal   Abdominal   Peds  Hematology negative hematology ROS (+)   Anesthesia Other Findings   Reproductive/Obstetrics                           Anesthesia Physical Anesthesia Plan  ASA: III  Anesthesia Plan: General   Post-op Pain Management:    Induction: Intravenous  Airway Management Planned: Oral ETT  Additional Equipment:   Intra-op Plan:   Post-operative Plan: Extubation in OR  Informed Consent: I have reviewed the patients History and Physical, chart, labs and discussed the procedure including the risks, benefits and alternatives for the proposed anesthesia with the patient or authorized representative who has indicated his/her understanding and acceptance.   Dental advisory given  Plan Discussed with: CRNA and Surgeon  Anesthesia Plan Comments: (Plan routine monitors, GETA)        Anesthesia Quick Evaluation

## 2015-08-13 NOTE — Op Note (Signed)
OPERATIVE NOTE-LAP CHOLECYSTECTOMY  Preoperative diagnosis:  Chronic right upper quadrant pain, chronic cholecystitis  Postoperative diagnosis:  same  Procedure: Laparoscopic cholecystectomy with cholangiogram.  Surgeon: Avel Peaceodd Jillyan Plitt, M.D.  Asst.:  none  Anesthesia: General  Indication:   This is a 79 year old female with biliary colic type pain. Ultrasound demonstrates a gallbladder polyp in the body of the gallbladder. No gallstones. CT scan does not demonstrate any pathology either. Other GI workup has been negative.liver function tests are not elevated. After discussion with her about the pros and cons of proceeding with cholecystectomy as well as unknown success rate, she has elected to proceed.  Technique: She was brought to the operating room, placed supine on the operating table, and a general anesthetic was administered.  The abdominal wall was then sterilely prepped and draped. A timeout was performed. She is placed in slight reverse Trendelenburg position. A 5 mm incision was made in the right subcostal area. Using a 5 mm Optiview trocar, access was gained into the peritoneal cavity and a pneumoperitoneum was created. Inspection of the area underneath the trocar demonstrated no evidence of bleeding or organ injury. Local anesthetic (Marcaine) was infiltrated in the subumbilical region. A small incision was made in the subumbilical region and 11 mm trocar placed into the abdominal cavity.Two more 5 mm trocars were then placed into the abdominal cavity under laparoscopic vision. One in the epigastric area, and one in the right upper quadrant area. The gallbladder was visualized and the fundus was grasped and retracted toward the right shoulder. No acute inflammatory changes were noted. There were a few adhesions to the omentum. These adhesions were separated from the gallbladder.  The infundibulum was mobilized with dissection close to the gallbladder and retracted laterally. The cystic  duct was identified and a window was created around it. The cystic artery was also identified and a window was created around it. The critical view was achieved. A clip was placed at the neck of the gallbladder. A small incision was made in the cystic duct. A cholangiocatheter was introduced through the anterior abdominal wall and placed in the cystic duct. A intraoperative cholangiogram was then performed.  Under real-time fluoroscopy, dilute contrast was injected into the cystic duct.  The common hepatic duct, the right and left hepatic ducts, and the common duct were all visualized. Contrast drained into the duodenum with some mobile filling defects that could be air bubbles. Radiologist read the cholangiogram and felt they were air bubbles and there was no evidence of a persistent defect to suggest choledocholithiasis.  The cholangiocatheter was removed, the cystic duct was clipped 3 times on the biliary side, and then the cystic duct was divided sharply. No bile leak was noted from the cystic duct stump.  The cystic artery was then clipped and divided. Following this the gallbladder was dissected free from the liver using electrocautery. The gallbladder was then placed in a retrieval bag and removed from the abdominal cavity through the subumbilical incision.  The gallbladder fossa was inspected, irrigated, and bleeding was controlled with electrocautery. Inspection showed that hemostasis was adequate and there was no evidence of bile leak.  The irrigation fluid was evacuated as much as possible.  The subumbilical trocar was removed and the fascial defect was closed by using a 0 Vicryl suture. The remaining trocars were removed and the pneumoperitoneum was released. The skin incisions were closed with 4-0 Monocryl subcuticular stitches. Steri-Strips and sterile dressings were applied.  The procedure was well-tolerated without any apparent complications.  She was taken to the recovery room in  satisfactory condition.

## 2015-08-13 NOTE — H&P (Signed)
[name] 08/05/2015 11:38 AM Location: [facility] Patient #: [id number] DOB: 05-29-1936 Married / Language: [place] / Race: White Female   History of Present Illness [name](Taelynn Mcelhannon J. Jillyn Stacey MD; 08/05/2015 12:29 PM) The patient is a 79 year old female.  Note:She returns because she is interested in cholecystectomy. CT scan was unrevealing. She states she took a Tramadol and was tired the last time I saw her so she feels she answered some of the questions incorrectly. She states she has fairly continuous right upper quadrant pain radiating to the right back. She has a known gallbladder polyp in the body of the gallbladder.She now has some increased nausea and vomiting and is having difficulty keeping food down. She is interested in cholecystectomy. Her husband is here with her.  Allergies Fay Records(Ashley Beck, CMA; 08/05/2015 11:38 AM) No Known Drug Allergies10/09/2015  Medication History Fay Records(Ashley Beck, CMA; 08/05/2015 11:38 AM) AmLODIPine Besylate (10MG  Tablet, Oral) Active. Metoprolol Tartrate (25MG  Tablet, Oral) Active. CloNIDine HCl (0.1MG  Tablet, Oral) Active. Fish Oil (1000MG  Capsule, Oral) Active. Vitamin D (1000UNIT Tablet, Oral) Active. Benefiber (Oral as needed) Active. Medications Reconciled  Vitals Fay Records(Ashley Beck CMA; 08/05/2015 11:39 AM) 08/05/2015 11:38 AM Weight: 106.8 lb Height: 61in Body Surface Area: 1.45 m Body Mass Index: 20.18 kg/m  Temp.: 97.69F(Temporal)  Pulse: 60 (Regular)  BP: 124/64 (Sitting, Left Arm, Standard)       Physical Exam [name](Vontrell Pullman J. Wanda Rideout MD; 08/05/2015 12:30 PM) The physical exam findings are as follows: Note:General-thin female in no acute distress.  Abdomen-soft, lower midline scar, no tenderness or mass.    Assessment & Plan [name](Jamarrius Salay J. Valentine Barney MD; 08/05/2015 12:30 PM) GALLBLADDER POLYP (K82.4) Current Plans Started Reglan 10MG , 1 (one) Tablet before every meal, #30, 08/05/2015, Ref. x1. CONTINUOUS RUQ  ABDOMINAL PAIN (R10.11) Impression: CT scan is unrevealing. She continues to have the continuous pain and now having more persistent nausea and vomiting. No objective testing has demonstrated any obvious etiology. She is interested in proceeding with cholecystectomy.  Plan: We talked about the the next steps. Given the increased frequency of nausea and vomiting, I suggested a short course of Reglan until we can get her scheduled for cholecystectomy. We discussed the rare side effect of tardive dyskinesia and what to do about that acutely. I told her that a cholecystectomy would be reasonable but I could not give her probability of success and she understands this. I have explained the procedure, risks, and aftercare of cholecystectomy. Risks include but are not limited to bleeding, infection, wound problems, anesthesia, diarrhea, bile leak, injury to common bile duct/liver/intestine. She seems to understand and would like to proceed.  Avel Peaceodd Elysa Womac, MD

## 2015-08-17 ENCOUNTER — Encounter (HOSPITAL_COMMUNITY): Payer: Self-pay | Admitting: General Surgery

## 2016-01-14 ENCOUNTER — Emergency Department (HOSPITAL_COMMUNITY): Payer: BC Managed Care – PPO

## 2016-01-14 ENCOUNTER — Emergency Department (HOSPITAL_COMMUNITY)
Admission: EM | Admit: 2016-01-14 | Discharge: 2016-01-14 | Disposition: A | Discharge status: Home / Self Care | Payer: BC Managed Care – PPO | Attending: Emergency Medicine | Admitting: Emergency Medicine

## 2016-01-14 ENCOUNTER — Encounter (HOSPITAL_COMMUNITY): Payer: Self-pay | Admitting: *Deleted

## 2016-01-14 DIAGNOSIS — Z79899 Other long term (current) drug therapy: Secondary | ICD-10-CM | POA: Insufficient documentation

## 2016-01-14 DIAGNOSIS — I10 Essential (primary) hypertension: Secondary | ICD-10-CM | POA: Diagnosis not present

## 2016-01-14 DIAGNOSIS — M542 Cervicalgia: Secondary | ICD-10-CM | POA: Diagnosis present

## 2016-01-14 DIAGNOSIS — Z880 Allergy status to penicillin: Secondary | ICD-10-CM | POA: Insufficient documentation

## 2016-01-14 DIAGNOSIS — M5412 Radiculopathy, cervical region: Secondary | ICD-10-CM

## 2016-01-14 HISTORY — DX: Encounter for other specified aftercare: Z51.89

## 2016-01-14 LAB — BASIC METABOLIC PANEL
ANION GAP: 17 — AB (ref 5–15)
BUN: 35 mg/dL — ABNORMAL HIGH (ref 6–20)
CHLORIDE: 106 mmol/L (ref 101–111)
CO2: 17 mmol/L — AB (ref 22–32)
Calcium: 10.4 mg/dL — ABNORMAL HIGH (ref 8.9–10.3)
Creatinine, Ser: 1.39 mg/dL — ABNORMAL HIGH (ref 0.44–1.00)
GFR calc Af Amer: 41 mL/min — ABNORMAL LOW (ref >60)
GFR calc non Af Amer: 35 mL/min — ABNORMAL LOW (ref >60)
Glucose, Bld: 82 mg/dL (ref 65–99)
Potassium: 4.9 mmol/L (ref 3.5–5.1)
Sodium: 140 mmol/L (ref 135–145)

## 2016-01-14 LAB — CBC
HCT: 40 % (ref 36.0–46.0)
HEMOGLOBIN: 13.6 g/dL (ref 12.0–15.0)
MCH: 30.9 pg (ref 26.0–34.0)
MCHC: 34 g/dL (ref 30.0–36.0)
MCV: 90.9 fL (ref 78.0–100.0)
Platelets: 234 10*3/uL (ref 150–400)
RBC: 4.4 MIL/uL (ref 3.87–5.11)
RDW: 13.5 % (ref 11.5–15.5)
WBC: 6.4 10*3/uL (ref 4.0–10.5)

## 2016-01-14 LAB — I-STAT TROPONIN, ED: TROPONIN I, POC: 0.01 ng/mL (ref 0.00–0.08)

## 2016-01-14 MED ORDER — LIDOCAINE 5 % EX PTCH: 1.0000 | MEDICATED_PATCH | CUTANEOUS | Status: DC

## 2016-01-14 MED ORDER — ONDANSETRON 4 MG PO TBDP
4.0000 mg | ORAL_TABLET | Freq: Once | ORAL | Status: AC
  Administered 2016-01-14: 4 mg via ORAL
  Filled 2016-01-14: qty 1

## 2016-01-14 MED ORDER — PREDNISONE 20 MG PO TABS
40.0000 mg | ORAL_TABLET | Freq: Every day | ORAL | Status: DC
Start: 2016-01-14 — End: 2016-06-13

## 2016-01-14 MED ORDER — HYDROCODONE-ACETAMINOPHEN 5-325 MG PO TABS: 1.0000 | ORAL_TABLET | Freq: Four times a day (QID) | ORAL | Status: DC | PRN

## 2016-01-14 MED ORDER — HYDROCODONE-ACETAMINOPHEN 5-325 MG PO TABS
1.0000 | ORAL_TABLET | Freq: Once | ORAL | Status: AC
  Administered 2016-01-14: 1 via ORAL
  Filled 2016-01-14: qty 1

## 2016-01-14 MED ORDER — DIAZEPAM 2 MG PO TABS
2.0000 mg | ORAL_TABLET | Freq: Once | ORAL | Status: AC
  Administered 2016-01-14: 2 mg via ORAL
  Filled 2016-01-14: qty 1

## 2016-01-14 NOTE — ED Notes (Signed)
Pt sent here by here MD for L neck, shoulder and arm pain.  Nothing increases or decreases pain.

## 2016-01-14 NOTE — Discharge Instructions (Signed)

## 2016-01-14 NOTE — ED Provider Notes (Addendum)
CSN: [id number]     Arrival date & time 01/14/16  1145 History   First MD Initiated Contact with Patient 01/14/16 1207     Chief Complaint  Patient presents with  . Arm Pain     (Consider location/radiation/quality/duration/timing/severity/associated sxs/prior Treatment) HPI Comments: Patient is a 80 year old lady with a history of hypertension presenting today with constant left-sided neck, shoulder and arm pain that started 2 days ago. She cannot recall any trauma or lifting that occurred prior to the pain starting. It has persisted and not improved with naproxen. She states the pain is so bad it is causing her to lose her appetite. She denies any weakness and says nothing seems to make the pain worse. She is taking all of her blood pressure medication today when she went to her doctor's office they were concerned about her heart and sent her here for further evaluation.  Patient is a 80 y.o. female presenting with arm pain. The history is provided by the patient.  Arm Pain This is a new problem. The current episode started 2 days ago. The problem occurs constantly. The problem has not changed since onset.Pertinent negatives include no chest pain, no abdominal pain and no shortness of breath. Associated symptoms comments: No numbness in the left arm.  Difficulty sleeping due to the pain. Decreased appetite. No difficulty walking or moving the arm.. Nothing aggravates the symptoms. Nothing relieves the symptoms. Treatments tried: Naproxen. The treatment provided no relief.    Past Medical History  Diagnosis Date  . Hypertension   . Thyroid disease   . PONV (postoperative nausea and vomiting)     nausea  . Blood transfusion without reported diagnosis    Past Surgical History  Procedure Laterality Date  . Abdominal hysterectomy    . Thyroidectomy, partial    . Cholecystectomy N/A 08/13/2015    Procedure: LAPAROSCOPIC CHOLECYSTECTOMY WITH INTRAOPERATIVE CHOLANGIOGRAM;  Surgeon: Avel Peace, MD;  Location: [facility] OR;  Service: General;  Laterality: N/A;   Family History  Problem Relation Age of Onset  . Stroke Mother    Social History  Substance Use Topics  . Smoking status: Never Smoker   . Smokeless tobacco: Never Used  . Alcohol Use: No   OB History    No data available     Review of Systems  Constitutional: Positive for appetite change. Negative for fever.  HENT: Negative for trouble swallowing and voice change.   Respiratory: Negative for shortness of breath.   Cardiovascular: Negative for chest pain.  Gastrointestinal: Negative for abdominal pain.  All other systems reviewed and are negative.     Allergies  Amoxicillin  Home Medications   Prior to Admission medications   Medication Sig Start Date End Date Taking? Authorizing Provider  amLODipine (NORVASC) 10 MG tablet Take 10 mg by mouth daily.   Yes Historical Provider, MD  cloNIDine (CATAPRES) 0.1 MG tablet Take 0.1 mg by mouth daily.   Yes Historical Provider, MD  fish oil-omega-3 fatty acids 1000 MG capsule Take 1 g by mouth daily.   Yes Historical Provider, MD  LORazepam (ATIVAN) 0.5 MG tablet Take 0.5 mg by mouth 2 (two) times daily as needed. anxiety 07/30/15  Yes Historical Provider, MD  metoprolol tartrate (LOPRESSOR) 25 MG tablet Take 25 mg by mouth 2 (two) times daily.   Yes Historical Provider, MD  naproxen sodium (ANAPROX) 220 MG tablet Take 220 mg by mouth 2 (two) times daily as needed (pain).   Yes Historical Provider, MD  HYDROcodone-acetaminophen (NORCO/VICODIN) 5-325 MG tablet Take 1-2 tablets by mouth every 4 (four) hours as needed for moderate pain or severe pain. Patient not taking: Reported on 01/14/2016 08/13/15   Avel Peaceodd Rosenbower, MD  ondansetron (ZOFRAN) 4 MG tablet Take 4 mg by mouth every 8 (eight) hours. 07/08/15   Historical Provider, MD  ondansetron (ZOFRAN) 4 MG tablet Take 1 tablet (4 mg total) by mouth every 8 (eight) hours as needed for nausea. 08/13/15   Avel Peaceodd  Rosenbower, MD  traMADol (ULTRAM) 50 MG tablet Take 50 mg by mouth every 8 (eight) hours as needed. pain 06/01/15   Historical Provider, MD   BP 184/101 mmHg  Pulse 107  Temp(Src) 98.1 F (36.7 C) (Oral)  Resp 18  Ht 5\' 1"  (1.549 m)  Wt 112 lb (50.803 kg)  BMI 21.17 kg/m2  SpO2 98% Physical Exam  Constitutional: She is oriented to person, place, and time. She appears well-developed and well-nourished. No distress.  HENT:  Head: Normocephalic and atraumatic.  Mouth/Throat: Oropharynx is clear and moist.  Eyes: Conjunctivae and EOM are normal. Pupils are equal, round, and reactive to light.  Neck: Normal range of motion. Neck supple. Muscular tenderness present. No spinous process tenderness present. Carotid bruit is not present.    Tenderness over the left sternocleidomastoid and trapezius with palpation  Cardiovascular: Normal rate, regular rhythm and intact distal pulses.   No murmur heard. No carotid bruits present  Pulmonary/Chest: Effort normal and breath sounds normal. No respiratory distress. She has no wheezes. She has no rales.  Abdominal: Soft. She exhibits no distension. There is no tenderness. There is no rebound and no guarding.  Musculoskeletal: Normal range of motion. She exhibits no edema.       Left shoulder: She exhibits tenderness. She exhibits normal range of motion.  Pulses are equal bilaterally in the upper extremities. Able to completely passively range the left shoulder with only minimal tenderness. Normal sensation in the upper and lower extremities.  Neurological: She is alert and oriented to person, place, and time. She has normal strength. No sensory deficit.  Skin: Skin is warm and dry. No rash noted. No erythema.  Psychiatric: She has a normal mood and affect. Her behavior is normal.  Nursing note and vitals reviewed.   ED Course  Procedures (including critical care time) Labs Review Labs Reviewed  BASIC METABOLIC PANEL - Abnormal; Notable for the  following:    CO2 17 (*)    BUN 35 (*)    Creatinine, Ser 1.39 (*)    Calcium 10.4 (*)    GFR calc non Af Amer 35 (*)    GFR calc Af Amer 41 (*)    Anion gap 17 (*)    All other components within normal limits  CBC  I-STAT TROPOININ, ED    Imaging Review Dg Chest 2 View  01/14/2016  CLINICAL DATA:  Chest pain with radiation into the left arm EXAM: CHEST  2 VIEW COMPARISON:  01/10/2012 FINDINGS: Cardiac shadow is stable. Postoperative changes in the lower right neck are again seen. The lungs are hyperinflated without focal infiltrate or sizable effusion. No acute bony abnormality is seen. IMPRESSION: COPD without acute abnormality. Electronically Signed   By: Alcide CleverMark  Lukens M.D.   On: 01/14/2016 12:24   Ct Cervical Spine Wo Contrast  01/14/2016  CLINICAL DATA:  Neck and left arm pain for 2 days, no known injury, initial encounter EXAM: CT CERVICAL SPINE WITHOUT CONTRAST TECHNIQUE: Multidetector CT imaging of the cervical spine  was performed without intravenous contrast. Multiplanar CT image reconstructions were also generated. COMPARISON:  None. FINDINGS: Seven cervical segments are well visualized. Vertebral body height is well maintained. Mild anterolisthesis of C3 on C4 and C4 on C5 as well as C5 on C6 is noted due to facet hypertrophic changes. The facet changes are more marked on the left with associated neural foraminal changes particularly at C3-4, C4-5 and C5-6. No acute fracture is seen. No focal disc protrusion is identified. The surrounding soft tissues demonstrate hypertrophy of the left lobe of the thyroid. The right lobe has been surgically removed. No other focal soft tissue abnormality is seen. IMPRESSION: Multilevel degenerative change worse on the left than the right with associated neural foraminal narrowing. No acute abnormality is noted. Electronically Signed   By: Alcide Clever M.D.   On: 01/14/2016 14:17   I have personally reviewed and evaluated these images and lab results as  part of my medical decision-making.   EKG Interpretation   Date/Time:  Thursday January 14 2016 11:51:17 EDT Ventricular Rate:  111 PR Interval:  168 QRS Duration: 88 QT Interval:  332 QTC Calculation: 451 R Axis:   28 Text Interpretation:  Sinus tachycardia Anterior infarct , age  undetermined No significant change since last tracing Confirmed by  Anitra Lauth  MD, Alphonzo Lemmings ([id number]) on 01/14/2016 12:17:26 PM      MDM   Final diagnoses:  Neck pain  Cervical radiculopathy    Patient is a 80 year old female with a history of hypertension presenting from her PCP office today for further evaluation. Patient states for the last 2 days she's had constant left neck, shoulder pain radiating down to her arm. She denies any chest pain, shortness of breath or difficulty ambulating. She states it feels better if she is moving around because she's not thinking about it but nothing makes it better or worse. She took naproxen without improvement yesterday. When she saw her doctor today and they were concerned that this could be cardiac in nature and sent her here. She has taken all of her blood pressure medications today and on exam has no carotid bruits, equal pulses bilaterally and reproducible pain with palpation of the trapezius and sternocleidomastoid.  EKG today showing sinus tachycardia but no other acute changes. Chest x-ray within normal limits. Troponin is negative and after having 2 days of constant pain if this was cardiac the troponin should be elevated by now, normal CBC and BMP with slight acute kidney injury and minimal anion gap of 17. Patient states she has not been eating over the last day or so because the pain is taking away her appetite. She denies any infectious symptoms and displays no signs of meningitis. Low suspicion for dissection at this time or cardiac disease. The patient may have a pinched nerve as she states in the past she's had intermittent tingling in the left arm that is just  gone away spontaneously.  Patient given pain medication and a muscle relaxer. CT of the neck pending  2:21 PM Pt's CT showed multiple degenerative changes on the left which could be cause of sx.  No other acute findings.  Repeat BP improved to 165/71.  On repeat eval after meds pt feeling some better but still having pain.  Will d/c home with [facility] f/u.    Gwyneth Sprout, MD 01/14/16 1458  Gwyneth Sprout, MD 01/14/16 1501

## 2016-01-28 IMAGING — CT CT ABD-PELV W/ CM
2 of 5 series · 16 of 46 positions shown, 18 images · IV contrast (APPLIED)
Comparison: 12/09/2009

CLINICAL DATA: Right-sided abdominal pain and nausea for 3 weeks.

EXAM:
CT ABDOMEN AND PELVIS WITH CONTRAST
TECHNIQUE: Multidetector CT imaging of the abdomen and pelvis was performed
using the standard protocol following bolus administration of
intravenous contrast.
CONTRAST:  100mL 16F69P-Q00 IOPAMIDOL (16F69P-Q00) INJECTION 61%

[Series 2: abd/pelvis w/cm · axial · 0.67mm/px · z∈[-394,-29]mm · 13 of 83 slices shown, 15 images]
[im 5/83  soft-tissue]
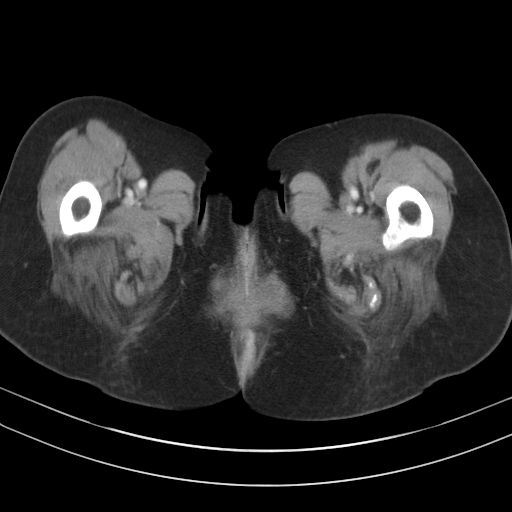
[im 5/83  bone]
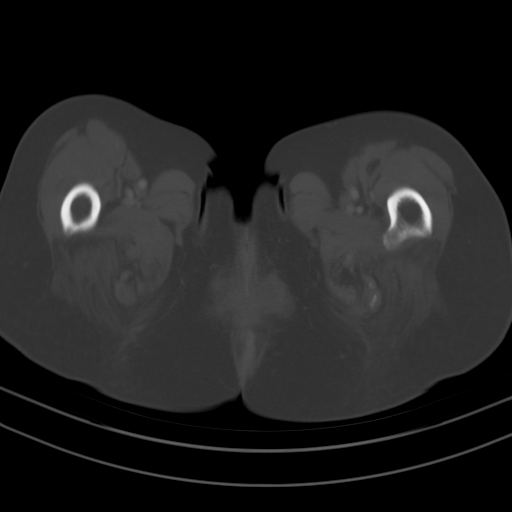
[im 13/83  soft-tissue]
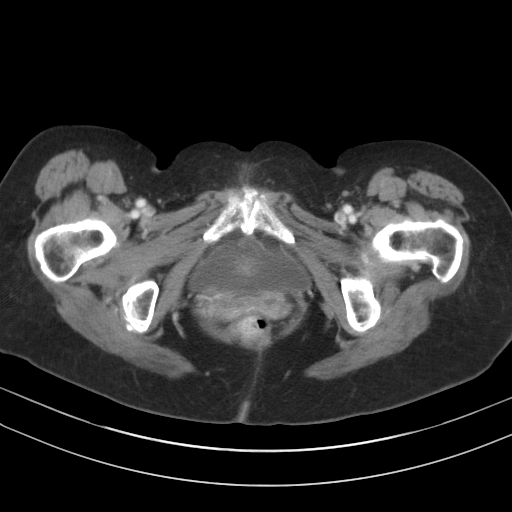
[im 17/83  soft-tissue]
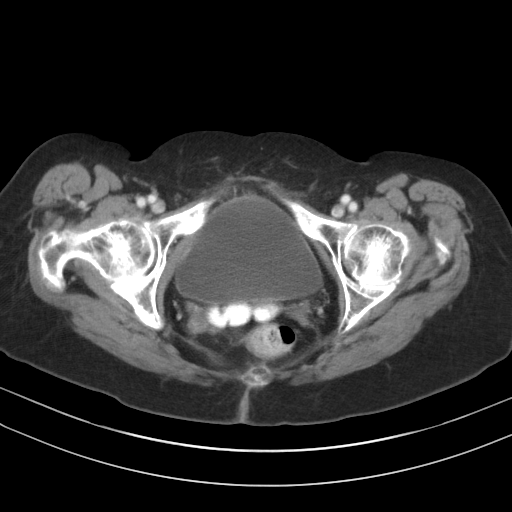
[im 25/83  soft-tissue]
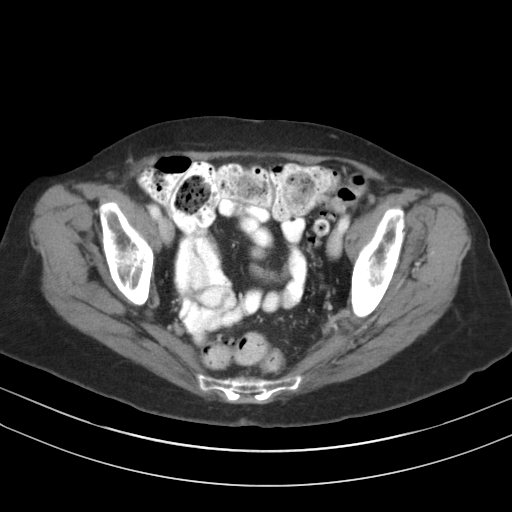
[im 29/83  soft-tissue]
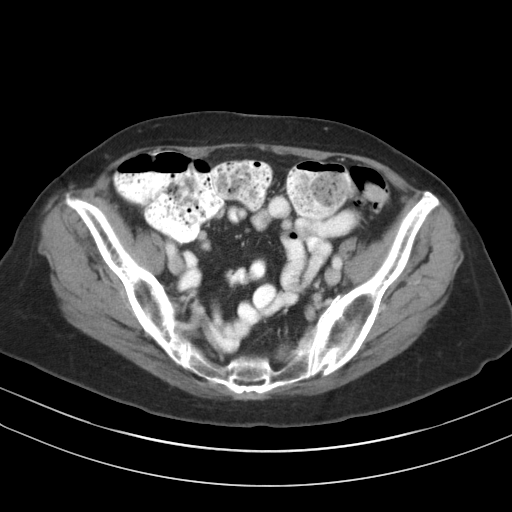
[im 37/83  soft-tissue]
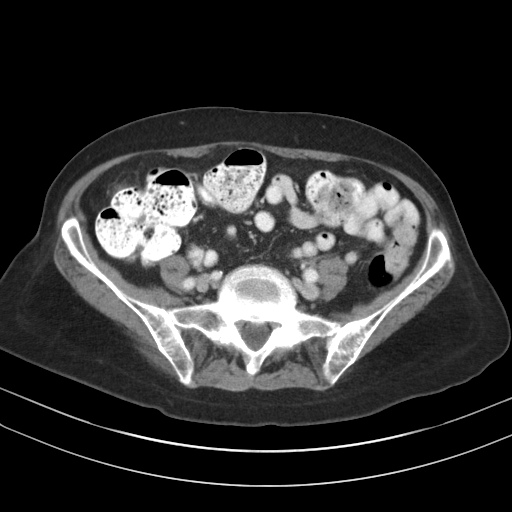
[im 42/83  soft-tissue]
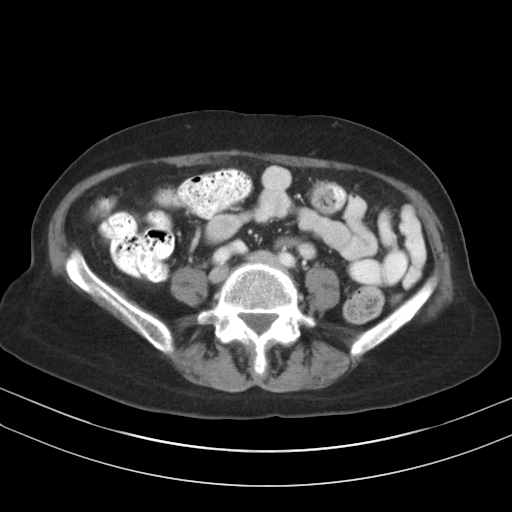
[im 46/83  soft-tissue]
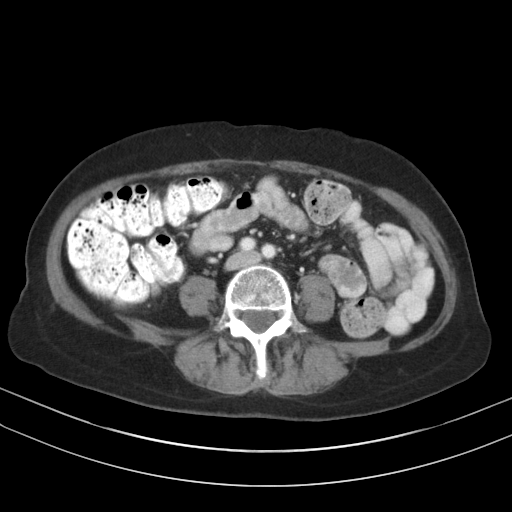
[im 54/83  soft-tissue]
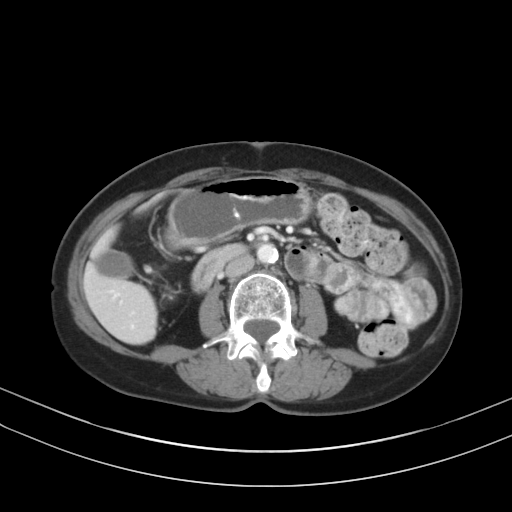
[im 54/83  bone]
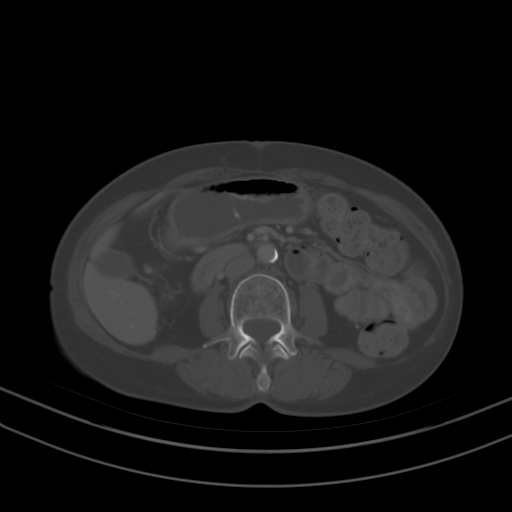
[im 58/83  soft-tissue]
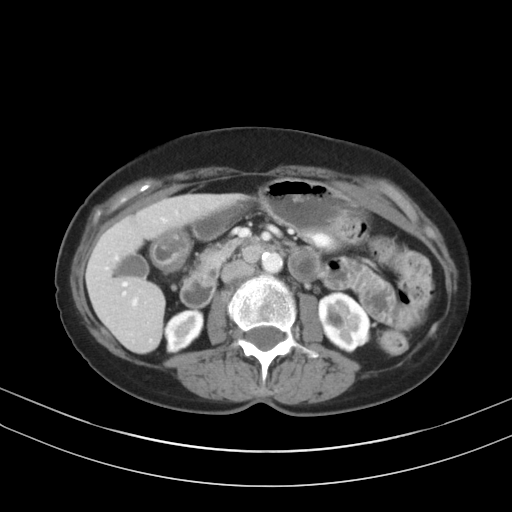
[im 66/83  soft-tissue]
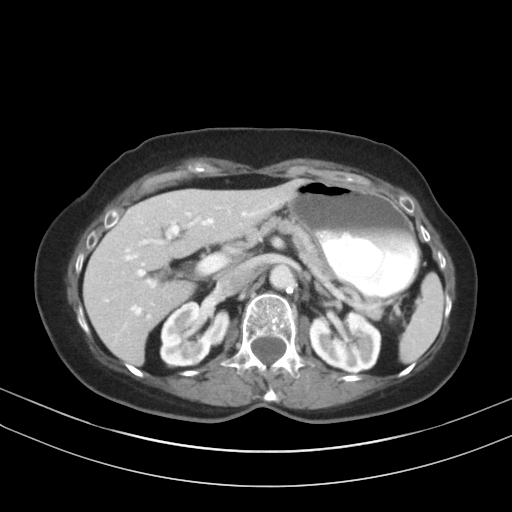
[im 70/83  soft-tissue]
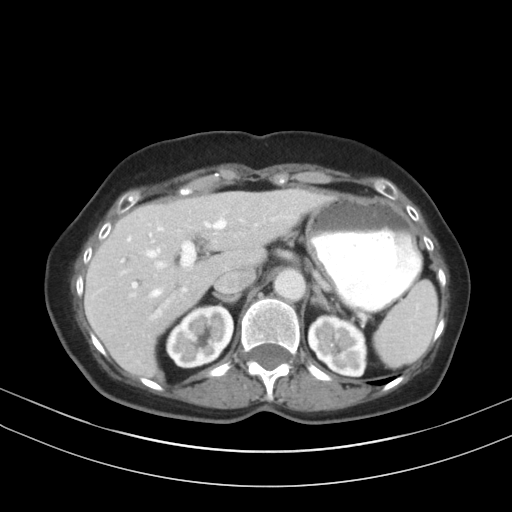
[im 78/83  soft-tissue]
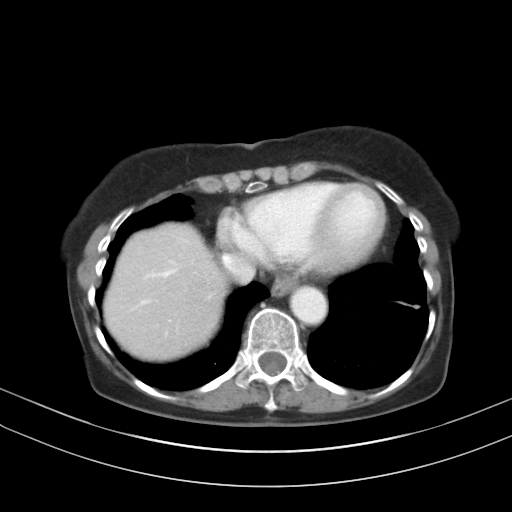

[Series 3: cor · coronal · 0.66mm/px · 3 of 70 slices shown]
[im 24/70  soft-tissue]
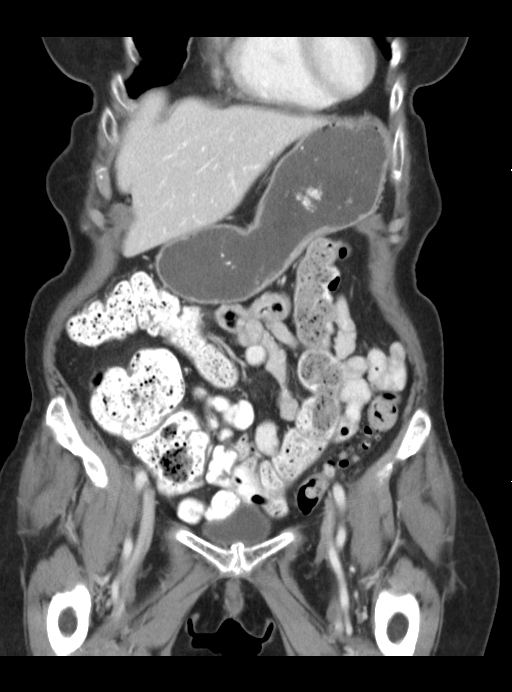
[im 31/70  soft-tissue]
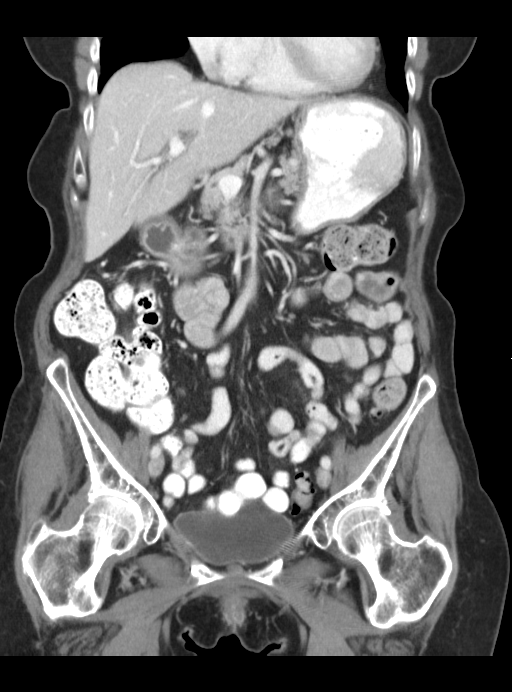
[im 39/70  soft-tissue]
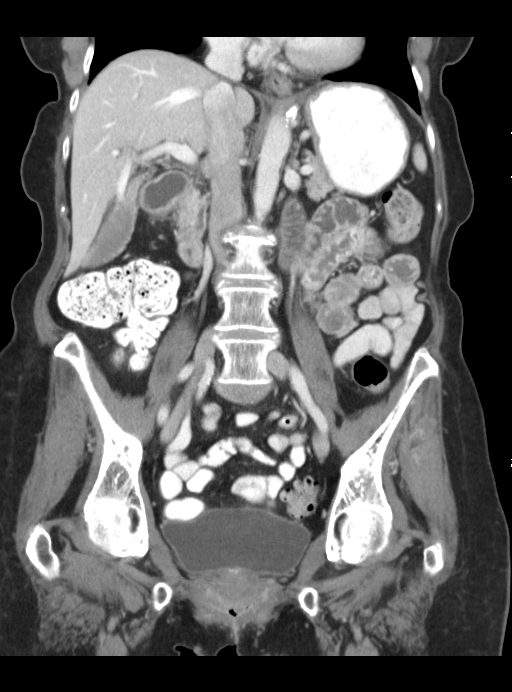

[16 of 46 positions shown; findings below may reference images not displayed]

FINDINGS: Lower chest:  No acute findings.

Hepatobiliary: No masses or other significant abnormality.
Gallbladder is unremarkable.

Pancreas: No mass, inflammatory changes, or other significant
abnormality.

Spleen: Within normal limits in size and appearance.

Adrenals/Urinary Tract: No masses identified. No evidence of
hydronephrosis. Small benign-appearing left renal cysts again noted.

Stomach/Bowel: No evidence of obstruction, inflammatory process, or
abnormal fluid collections. Sigmoid diverticulosis again
demonstrated, however there is no evidence of diverticulitis. Normal
appendix visualized.

Vascular/Lymphatic: No pathologically enlarged lymph nodes. No
evidence of abdominal aortic aneurysm.

Reproductive: Prior hysterectomy noted. Adnexal regions are
unremarkable in appearance.

Other: None.

Musculoskeletal:  No suspicious bone lesions identified.
IMPRESSION: Stable exam. Sigmoid colon diverticulosis again demonstrated. No
radiographic evidence of diverticulitis or other acute findings.

## 2016-04-22 ENCOUNTER — Other Ambulatory Visit: Payer: Self-pay | Admitting: Neurological Surgery

## 2016-04-22 DIAGNOSIS — M5412 Radiculopathy, cervical region: Secondary | ICD-10-CM

## 2016-04-28 ENCOUNTER — Ambulatory Visit
Admission: RE | Admit: 2016-04-28 | Discharge: 2016-04-28 | Disposition: A | Discharge status: Home / Self Care | Payer: BC Managed Care – PPO | Source: Ambulatory Visit | Attending: Neurological Surgery | Admitting: Neurological Surgery

## 2016-04-28 DIAGNOSIS — M5412 Radiculopathy, cervical region: Secondary | ICD-10-CM

## 2016-04-28 MED ORDER — TRIAMCINOLONE ACETONIDE 40 MG/ML IJ SUSP (RADIOLOGY)
60.0000 mg | Freq: Once | INTRAMUSCULAR | Status: AC
  Administered 2016-04-28: 60 mg via EPIDURAL

## 2016-04-28 MED ORDER — IOPAMIDOL (ISOVUE-M 300) INJECTION 61%
1.0000 mL | Freq: Once | INTRAMUSCULAR | Status: AC | PRN
  Administered 2016-04-28: 1 mL via EPIDURAL

## 2016-04-28 NOTE — Discharge Instructions (Signed)

## 2016-05-25 ENCOUNTER — Other Ambulatory Visit: Payer: Self-pay | Admitting: Internal Medicine

## 2016-05-25 DIAGNOSIS — Z1231 Encounter for screening mammogram for malignant neoplasm of breast: Secondary | ICD-10-CM

## 2016-06-02 ENCOUNTER — Ambulatory Visit
Admission: RE | Admit: 2016-06-02 | Discharge: 2016-06-02 | Disposition: A | Discharge status: Home / Self Care | Payer: BC Managed Care – PPO | Source: Ambulatory Visit | Attending: Internal Medicine | Admitting: Internal Medicine

## 2016-06-02 DIAGNOSIS — Z1231 Encounter for screening mammogram for malignant neoplasm of breast: Secondary | ICD-10-CM

## 2016-06-03 ENCOUNTER — Other Ambulatory Visit: Payer: Self-pay | Admitting: Internal Medicine

## 2016-06-03 DIAGNOSIS — R928 Other abnormal and inconclusive findings on diagnostic imaging of breast: Secondary | ICD-10-CM

## 2016-06-03 DIAGNOSIS — I639 Cerebral infarction, unspecified: Secondary | ICD-10-CM

## 2016-06-03 HISTORY — DX: Cerebral infarction, unspecified: I63.9

## 2016-06-13 ENCOUNTER — Encounter (HOSPITAL_COMMUNITY)
Admission: EM | Disposition: A | Discharge status: Home Health | Payer: Self-pay | Source: Home / Self Care | Attending: Internal Medicine

## 2016-06-13 ENCOUNTER — Inpatient Hospital Stay (HOSPITAL_COMMUNITY): Payer: BC Managed Care – PPO

## 2016-06-13 ENCOUNTER — Inpatient Hospital Stay (HOSPITAL_COMMUNITY)
Admission: EM | Admit: 2016-06-13 | Discharge: 2016-06-21 | DRG: 871 | Disposition: A | Discharge status: Home Health | Payer: BC Managed Care – PPO | Attending: Internal Medicine | Admitting: Internal Medicine

## 2016-06-13 ENCOUNTER — Emergency Department (HOSPITAL_COMMUNITY): Payer: BC Managed Care – PPO

## 2016-06-13 ENCOUNTER — Encounter (HOSPITAL_COMMUNITY): Payer: Self-pay | Admitting: Emergency Medicine

## 2016-06-13 DIAGNOSIS — R7881 Bacteremia: Secondary | ICD-10-CM

## 2016-06-13 DIAGNOSIS — I330 Acute and subacute infective endocarditis: Secondary | ICD-10-CM | POA: Diagnosis not present

## 2016-06-13 DIAGNOSIS — K254 Chronic or unspecified gastric ulcer with hemorrhage: Secondary | ICD-10-CM | POA: Diagnosis present

## 2016-06-13 DIAGNOSIS — N179 Acute kidney failure, unspecified: Secondary | ICD-10-CM | POA: Diagnosis present

## 2016-06-13 DIAGNOSIS — R197 Diarrhea, unspecified: Secondary | ICD-10-CM

## 2016-06-13 DIAGNOSIS — A084 Viral intestinal infection, unspecified: Secondary | ICD-10-CM | POA: Diagnosis present

## 2016-06-13 DIAGNOSIS — Z79899 Other long term (current) drug therapy: Secondary | ICD-10-CM

## 2016-06-13 DIAGNOSIS — I10 Essential (primary) hypertension: Secondary | ICD-10-CM | POA: Diagnosis present

## 2016-06-13 DIAGNOSIS — I340 Nonrheumatic mitral (valve) insufficiency: Secondary | ICD-10-CM | POA: Diagnosis not present

## 2016-06-13 DIAGNOSIS — K264 Chronic or unspecified duodenal ulcer with hemorrhage: Secondary | ICD-10-CM | POA: Diagnosis present

## 2016-06-13 DIAGNOSIS — Z452 Encounter for adjustment and management of vascular access device: Secondary | ICD-10-CM | POA: Diagnosis not present

## 2016-06-13 DIAGNOSIS — E872 Acidosis: Secondary | ICD-10-CM | POA: Diagnosis not present

## 2016-06-13 DIAGNOSIS — D703 Neutropenia due to infection: Secondary | ICD-10-CM | POA: Diagnosis present

## 2016-06-13 DIAGNOSIS — D700 Congenital agranulocytosis: Secondary | ICD-10-CM | POA: Diagnosis not present

## 2016-06-13 DIAGNOSIS — B9620 Unspecified Escherichia coli [E. coli] as the cause of diseases classified elsewhere: Secondary | ICD-10-CM

## 2016-06-13 DIAGNOSIS — K920 Hematemesis: Secondary | ICD-10-CM

## 2016-06-13 DIAGNOSIS — Z87898 Personal history of other specified conditions: Secondary | ICD-10-CM

## 2016-06-13 DIAGNOSIS — R6521 Severe sepsis with septic shock: Secondary | ICD-10-CM | POA: Diagnosis present

## 2016-06-13 DIAGNOSIS — R112 Nausea with vomiting, unspecified: Secondary | ICD-10-CM | POA: Diagnosis present

## 2016-06-13 DIAGNOSIS — N183 Chronic kidney disease, stage 3 unspecified: Secondary | ICD-10-CM | POA: Diagnosis present

## 2016-06-13 DIAGNOSIS — J189 Pneumonia, unspecified organism: Secondary | ICD-10-CM | POA: Diagnosis present

## 2016-06-13 DIAGNOSIS — J9690 Respiratory failure, unspecified, unspecified whether with hypoxia or hypercapnia: Secondary | ICD-10-CM

## 2016-06-13 DIAGNOSIS — K5900 Constipation, unspecified: Secondary | ICD-10-CM | POA: Diagnosis not present

## 2016-06-13 DIAGNOSIS — F419 Anxiety disorder, unspecified: Secondary | ICD-10-CM | POA: Diagnosis present

## 2016-06-13 DIAGNOSIS — J9601 Acute respiratory failure with hypoxia: Secondary | ICD-10-CM | POA: Diagnosis present

## 2016-06-13 DIAGNOSIS — E878 Other disorders of electrolyte and fluid balance, not elsewhere classified: Secondary | ICD-10-CM | POA: Diagnosis not present

## 2016-06-13 DIAGNOSIS — K922 Gastrointestinal hemorrhage, unspecified: Secondary | ICD-10-CM | POA: Diagnosis not present

## 2016-06-13 DIAGNOSIS — A4151 Sepsis due to Escherichia coli [E. coli]: Secondary | ICD-10-CM | POA: Diagnosis not present

## 2016-06-13 DIAGNOSIS — N281 Cyst of kidney, acquired: Secondary | ICD-10-CM | POA: Diagnosis present

## 2016-06-13 DIAGNOSIS — Z789 Other specified health status: Secondary | ICD-10-CM

## 2016-06-13 DIAGNOSIS — R410 Disorientation, unspecified: Secondary | ICD-10-CM

## 2016-06-13 DIAGNOSIS — E860 Dehydration: Secondary | ICD-10-CM

## 2016-06-13 DIAGNOSIS — D500 Iron deficiency anemia secondary to blood loss (chronic): Secondary | ICD-10-CM | POA: Diagnosis not present

## 2016-06-13 DIAGNOSIS — R110 Nausea: Secondary | ICD-10-CM

## 2016-06-13 DIAGNOSIS — E876 Hypokalemia: Secondary | ICD-10-CM | POA: Diagnosis present

## 2016-06-13 DIAGNOSIS — R778 Other specified abnormalities of plasma proteins: Secondary | ICD-10-CM | POA: Diagnosis not present

## 2016-06-13 DIAGNOSIS — A419 Sepsis, unspecified organism: Principal | ICD-10-CM | POA: Diagnosis present

## 2016-06-13 DIAGNOSIS — R059 Cough, unspecified: Secondary | ICD-10-CM

## 2016-06-13 DIAGNOSIS — T39315A Adverse effect of propionic acid derivatives, initial encounter: Secondary | ICD-10-CM | POA: Diagnosis present

## 2016-06-13 DIAGNOSIS — Z90710 Acquired absence of both cervix and uterus: Secondary | ICD-10-CM

## 2016-06-13 DIAGNOSIS — I63431 Cerebral infarction due to embolism of right posterior cerebral artery: Secondary | ICD-10-CM

## 2016-06-13 DIAGNOSIS — N390 Urinary tract infection, site not specified: Secondary | ICD-10-CM | POA: Diagnosis present

## 2016-06-13 DIAGNOSIS — E890 Postprocedural hypothyroidism: Secondary | ICD-10-CM | POA: Diagnosis present

## 2016-06-13 DIAGNOSIS — D508 Other iron deficiency anemias: Secondary | ICD-10-CM

## 2016-06-13 DIAGNOSIS — Z880 Allergy status to penicillin: Secondary | ICD-10-CM | POA: Diagnosis not present

## 2016-06-13 DIAGNOSIS — R05 Cough: Secondary | ICD-10-CM

## 2016-06-13 DIAGNOSIS — D62 Acute posthemorrhagic anemia: Secondary | ICD-10-CM | POA: Diagnosis present

## 2016-06-13 DIAGNOSIS — I63331 Cerebral infarction due to thrombosis of right posterior cerebral artery: Secondary | ICD-10-CM | POA: Diagnosis not present

## 2016-06-13 DIAGNOSIS — R0600 Dyspnea, unspecified: Secondary | ICD-10-CM | POA: Diagnosis not present

## 2016-06-13 DIAGNOSIS — J80 Acute respiratory distress syndrome: Secondary | ICD-10-CM

## 2016-06-13 DIAGNOSIS — D709 Neutropenia, unspecified: Secondary | ICD-10-CM | POA: Diagnosis present

## 2016-06-13 DIAGNOSIS — I129 Hypertensive chronic kidney disease with stage 1 through stage 4 chronic kidney disease, or unspecified chronic kidney disease: Secondary | ICD-10-CM | POA: Diagnosis present

## 2016-06-13 DIAGNOSIS — D72819 Decreased white blood cell count, unspecified: Secondary | ICD-10-CM | POA: Diagnosis not present

## 2016-06-13 DIAGNOSIS — K2960 Other gastritis without bleeding: Secondary | ICD-10-CM | POA: Diagnosis not present

## 2016-06-13 DIAGNOSIS — I639 Cerebral infarction, unspecified: Secondary | ICD-10-CM | POA: Diagnosis not present

## 2016-06-13 DIAGNOSIS — R262 Difficulty in walking, not elsewhere classified: Secondary | ICD-10-CM

## 2016-06-13 HISTORY — DX: Chronic kidney disease, stage 3 unspecified: N18.30

## 2016-06-13 HISTORY — DX: Chronic kidney disease, stage 3 (moderate): N18.3

## 2016-06-13 HISTORY — DX: Cerebral infarction, unspecified: I63.9

## 2016-06-13 LAB — BLOOD CULTURE ID PANEL (REFLEXED)
Acinetobacter baumannii: NOT DETECTED
CANDIDA PARAPSILOSIS: NOT DETECTED
CARBAPENEM RESISTANCE: NOT DETECTED
Candida albicans: NOT DETECTED
Candida glabrata: NOT DETECTED
Candida krusei: NOT DETECTED
Candida tropicalis: NOT DETECTED
ENTEROBACTERIACEAE SPECIES: DETECTED — AB
ENTEROCOCCUS SPECIES: NOT DETECTED
Enterobacter cloacae complex: NOT DETECTED
Escherichia coli: DETECTED — AB
HAEMOPHILUS INFLUENZAE: NOT DETECTED
Klebsiella oxytoca: NOT DETECTED
Klebsiella pneumoniae: NOT DETECTED
LISTERIA MONOCYTOGENES: NOT DETECTED
NEISSERIA MENINGITIDIS: NOT DETECTED
PSEUDOMONAS AERUGINOSA: NOT DETECTED
Proteus species: NOT DETECTED
STAPHYLOCOCCUS AUREUS BCID: NOT DETECTED
STREPTOCOCCUS PNEUMONIAE: NOT DETECTED
STREPTOCOCCUS PYOGENES: NOT DETECTED
STREPTOCOCCUS SPECIES: NOT DETECTED
Serratia marcescens: NOT DETECTED
Staphylococcus species: NOT DETECTED
Streptococcus agalactiae: NOT DETECTED

## 2016-06-13 LAB — HEMOGLOBIN AND HEMATOCRIT, BLOOD
HCT: 21.1 % — ABNORMAL LOW (ref 36.0–46.0)
HEMOGLOBIN: 6.9 g/dL — AB (ref 12.0–15.0)

## 2016-06-13 LAB — CBC
HCT: 24.9 % — ABNORMAL LOW (ref 36.0–46.0)
HEMATOCRIT: 23.6 % — AB (ref 36.0–46.0)
HEMOGLOBIN: 8.1 g/dL — AB (ref 12.0–15.0)
HEMOGLOBIN: 7.8 g/dL — AB (ref 12.0–15.0)
MCH: 31 pg (ref 26.0–34.0)
MCH: 31.7 pg (ref 26.0–34.0)
MCHC: 32.5 g/dL (ref 30.0–36.0)
MCHC: 33.1 g/dL (ref 30.0–36.0)
MCV: 95.4 fL (ref 78.0–100.0)
MCV: 95.9 fL (ref 78.0–100.0)
Platelets: 187 10*3/uL (ref 150–400)
Platelets: 201 10*3/uL (ref 150–400)
RBC: 2.61 MIL/uL — AB (ref 3.87–5.11)
RBC: 2.46 MIL/uL — AB (ref 3.87–5.11)
RDW: 14.6 % (ref 11.5–15.5)
RDW: 14.9 % (ref 11.5–15.5)
WBC: 8.5 10*3/uL (ref 4.0–10.5)
WBC: 8.5 10*3/uL (ref 4.0–10.5)

## 2016-06-13 LAB — COMPREHENSIVE METABOLIC PANEL
ALK PHOS: 54 U/L (ref 38–126)
ALT: 15 U/L (ref 14–54)
ANION GAP: 8 (ref 5–15)
AST: 25 U/L (ref 15–41)
Albumin: 3 g/dL — ABNORMAL LOW (ref 3.5–5.0)
BILIRUBIN TOTAL: 0.8 mg/dL (ref 0.3–1.2)
BUN: 21 mg/dL — ABNORMAL HIGH (ref 6–20)
CALCIUM: 8.6 mg/dL — AB (ref 8.9–10.3)
CO2: 19 mmol/L — ABNORMAL LOW (ref 22–32)
CREATININE: 1.57 mg/dL — AB (ref 0.44–1.00)
Chloride: 112 mmol/L — ABNORMAL HIGH (ref 101–111)
GFR, EST AFRICAN AMERICAN: 35 mL/min — AB (ref >60)
GFR, EST NON AFRICAN AMERICAN: 30 mL/min — AB (ref >60)
Glucose, Bld: 99 mg/dL (ref 65–99)
Potassium: 3.3 mmol/L — ABNORMAL LOW (ref 3.5–5.1)
Sodium: 139 mmol/L (ref 135–145)
TOTAL PROTEIN: 5.2 g/dL — AB (ref 6.5–8.1)

## 2016-06-13 LAB — I-STAT CG4 LACTIC ACID, ED
Lactic Acid, Venous: 2.91 mmol/L (ref 0.5–1.9)
Lactic Acid, Venous: 1.5 mmol/L (ref 0.5–1.9)

## 2016-06-13 LAB — RESPIRATORY PANEL BY PCR

## 2016-06-13 LAB — CBC WITH DIFFERENTIAL/PLATELET
BASOS ABS: 0 10*3/uL (ref 0.0–0.1)
BASOS PCT: 2 %
EOS ABS: 0 10*3/uL (ref 0.0–0.7)
Eosinophils Relative: 4 %
HEMATOCRIT: 25.4 % — AB (ref 36.0–46.0)
HEMOGLOBIN: 8.2 g/dL — AB (ref 12.0–15.0)
Lymphocytes Relative: 21 %
Lymphs Abs: 0.2 10*3/uL — ABNORMAL LOW (ref 0.7–4.0)
MCH: 30.8 pg (ref 26.0–34.0)
MCHC: 32.3 g/dL (ref 30.0–36.0)
MCV: 95.5 fL (ref 78.0–100.0)
MONO ABS: 0 10*3/uL — AB (ref 0.1–1.0)
Monocytes Relative: 1 %
NEUTROS ABS: 0.7 10*3/uL — AB (ref 1.7–7.7)
NEUTROS PCT: 73 %
Platelets: 209 10*3/uL (ref 150–400)
RBC: 2.66 MIL/uL — ABNORMAL LOW (ref 3.87–5.11)
RDW: 14.5 % (ref 11.5–15.5)
WBC: 1 10*3/uL — CL (ref 4.0–10.5)

## 2016-06-13 LAB — MAGNESIUM: Magnesium: 1.1 mg/dL — ABNORMAL LOW (ref 1.7–2.4)

## 2016-06-13 LAB — BASIC METABOLIC PANEL
Anion gap: 9 (ref 5–15)
BUN: 19 mg/dL (ref 6–20)
CO2: 18 mmol/L — AB (ref 22–32)
Calcium: 8 mg/dL — ABNORMAL LOW (ref 8.9–10.3)
Chloride: 115 mmol/L — ABNORMAL HIGH (ref 101–111)
Creatinine, Ser: 1.46 mg/dL — ABNORMAL HIGH (ref 0.44–1.00)
GFR calc Af Amer: 39 mL/min — ABNORMAL LOW (ref >60)
GFR, EST NON AFRICAN AMERICAN: 33 mL/min — AB (ref >60)
GLUCOSE: 102 mg/dL — AB (ref 65–99)
POTASSIUM: 4 mmol/L (ref 3.5–5.1)
Sodium: 142 mmol/L (ref 135–145)

## 2016-06-13 LAB — PROTIME-INR
INR: 1.2
PROTHROMBIN TIME: 15.2 s (ref 11.4–15.2)

## 2016-06-13 LAB — URINE MICROSCOPIC-ADD ON

## 2016-06-13 LAB — POCT I-STAT, CHEM 8
BUN: 19 mg/dL (ref 6–20)
CALCIUM ION: 1.13 mmol/L — AB (ref 1.15–1.40)
CREATININE: 1.3 mg/dL — AB (ref 0.44–1.00)
Chloride: 114 mmol/L — ABNORMAL HIGH (ref 101–111)
GLUCOSE: 91 mg/dL (ref 65–99)
HCT: 20 % — ABNORMAL LOW (ref 36.0–46.0)
HEMOGLOBIN: 6.8 g/dL — AB (ref 12.0–15.0)
Potassium: 4.7 mmol/L (ref 3.5–5.1)
SODIUM: 143 mmol/L (ref 135–145)
TCO2: 18 mmol/L (ref 0–100)

## 2016-06-13 LAB — URINALYSIS, ROUTINE W REFLEX MICROSCOPIC
BILIRUBIN URINE: NEGATIVE
GLUCOSE, UA: NEGATIVE mg/dL
KETONES UR: 15 mg/dL — AB
Leukocytes, UA: NEGATIVE
Nitrite: NEGATIVE
PH: 6 (ref 5.0–8.0)
Protein, ur: >300 mg/dL — AB
Specific Gravity, Urine: 1.014 (ref 1.005–1.030)

## 2016-06-13 LAB — LACTIC ACID, PLASMA
LACTIC ACID, VENOUS: 1.6 mmol/L (ref 0.5–1.9)
Lactic Acid, Venous: 1.3 mmol/L (ref 0.5–1.9)

## 2016-06-13 LAB — CORTISOL: CORTISOL PLASMA: 14.3 ug/dL

## 2016-06-13 LAB — PREPARE RBC (CROSSMATCH)

## 2016-06-13 LAB — ABO/RH: ABO/RH(D): O POS

## 2016-06-13 LAB — TROPONIN I: TROPONIN I: 0.75 ng/mL — AB (ref <0.03)

## 2016-06-13 LAB — PROCALCITONIN: PROCALCITONIN: 30.85 ng/mL

## 2016-06-13 LAB — APTT: APTT: 34 s (ref 24–36)

## 2016-06-13 SURGERY — CANCELLED PROCEDURE

## 2016-06-13 SURGERY — EGD (ESOPHAGOGASTRODUODENOSCOPY)
Anesthesia: Moderate Sedation | Laterality: Left

## 2016-06-13 MED ORDER — DEXTROSE 5 % IV SOLN
0.0000 ug/min | INTRAVENOUS | Status: DC
  Administered 2016-06-13: 20 ug/min via INTRAVENOUS
  Filled 2016-06-13 (×2): qty 1

## 2016-06-13 MED ORDER — SODIUM CHLORIDE 0.9 % IV SOLN
500.0000 mg | Freq: Two times a day (BID) | INTRAVENOUS | Status: DC
  Administered 2016-06-13 – 2016-06-16 (×6): 500 mg via INTRAVENOUS
  Filled 2016-06-13 (×8): qty 500

## 2016-06-13 MED ORDER — ASPIRIN 300 MG RE SUPP
150.0000 mg | Freq: Every day | RECTAL | Status: DC
  Administered 2016-06-13: 150 mg via RECTAL
  Filled 2016-06-13 (×2): qty 1

## 2016-06-13 MED ORDER — OMEGA-3-ACID ETHYL ESTERS 1 G PO CAPS
1.0000 g | ORAL_CAPSULE | Freq: Every day | ORAL | Status: DC
  Administered 2016-06-13 – 2016-06-21 (×8): 1 g via ORAL
  Filled 2016-06-13 (×10): qty 1

## 2016-06-13 MED ORDER — ACETAMINOPHEN 10 MG/ML IV SOLN
1000.0000 mg | Freq: Four times a day (QID) | INTRAVENOUS | Status: DC | PRN
  Administered 2016-06-13: 1000 mg via INTRAVENOUS
  Filled 2016-06-13 (×2): qty 100

## 2016-06-13 MED ORDER — FAMOTIDINE IN NACL 20-0.9 MG/50ML-% IV SOLN
20.0000 mg | Freq: Two times a day (BID) | INTRAVENOUS | Status: DC
  Administered 2016-06-13: 20 mg via INTRAVENOUS
  Filled 2016-06-13 (×2): qty 50

## 2016-06-13 MED ORDER — SODIUM CHLORIDE 0.9 % IV BOLUS (SEPSIS)
1000.0000 mL | Freq: Once | INTRAVENOUS | Status: AC
  Administered 2016-06-13: 1000 mL via INTRAVENOUS

## 2016-06-13 MED ORDER — IBUPROFEN 200 MG PO TABS
400.0000 mg | ORAL_TABLET | Freq: Four times a day (QID) | ORAL | Status: DC | PRN
  Administered 2016-06-13: 400 mg via ORAL
  Filled 2016-06-13: qty 2

## 2016-06-13 MED ORDER — VANCOMYCIN HCL 500 MG IV SOLR
500.0000 mg | INTRAVENOUS | Status: DC
  Administered 2016-06-14: 500 mg via INTRAVENOUS
  Filled 2016-06-13: qty 500

## 2016-06-13 MED ORDER — SODIUM CHLORIDE 0.9 % IV BOLUS (SEPSIS)
500.0000 mL | Freq: Once | INTRAVENOUS | Status: AC
  Administered 2016-06-13: 500 mL via INTRAVENOUS

## 2016-06-13 MED ORDER — ONDANSETRON HCL 4 MG/2ML IJ SOLN
4.0000 mg | Freq: Once | INTRAMUSCULAR | Status: AC
  Administered 2016-06-13: 4 mg via INTRAVENOUS
  Filled 2016-06-13: qty 2

## 2016-06-13 MED ORDER — ONDANSETRON 4 MG PO TBDP
4.0000 mg | ORAL_TABLET | Freq: Three times a day (TID) | ORAL | Status: DC | PRN
  Administered 2016-06-15 – 2016-06-19 (×3): 4 mg via ORAL
  Filled 2016-06-13 (×4): qty 1

## 2016-06-13 MED ORDER — SODIUM CHLORIDE 0.9 % IV SOLN
80.0000 mg | Freq: Once | INTRAVENOUS | Status: AC
  Administered 2016-06-13: 80 mg via INTRAVENOUS
  Filled 2016-06-13: qty 80

## 2016-06-13 MED ORDER — LEVOFLOXACIN IN D5W 750 MG/150ML IV SOLN
750.0000 mg | Freq: Once | INTRAVENOUS | Status: AC
  Administered 2016-06-13: 750 mg via INTRAVENOUS
  Filled 2016-06-13: qty 150

## 2016-06-13 MED ORDER — ALBUTEROL SULFATE (2.5 MG/3ML) 0.083% IN NEBU: 2.5000 mg | INHALATION_SOLUTION | RESPIRATORY_TRACT | Status: DC | PRN

## 2016-06-13 MED ORDER — FAMOTIDINE IN NACL 20-0.9 MG/50ML-% IV SOLN: 20.0000 mg | INTRAVENOUS | Status: DC

## 2016-06-13 MED ORDER — SODIUM CHLORIDE 0.9 % IV SOLN
INTRAVENOUS | Status: DC
  Administered 2016-06-13: 06:00:00 via INTRAVENOUS

## 2016-06-13 MED ORDER — SODIUM CHLORIDE 0.9 % IV BOLUS (SEPSIS)
2000.0000 mL | Freq: Once | INTRAVENOUS | Status: DC
Start: 2016-06-13 — End: 2016-06-21

## 2016-06-13 MED ORDER — DM-GUAIFENESIN ER 30-600 MG PO TB12
1.0000 | ORAL_TABLET | Freq: Two times a day (BID) | ORAL | Status: DC | PRN
  Filled 2016-06-13: qty 1

## 2016-06-13 MED ORDER — SODIUM CHLORIDE 0.9 % IV SOLN
1.0000 g | Freq: Once | INTRAVENOUS | Status: AC
  Administered 2016-06-13: 1 g via INTRAVENOUS
  Filled 2016-06-13: qty 10

## 2016-06-13 MED ORDER — POTASSIUM CHLORIDE 20 MEQ/15ML (10%) PO SOLN
20.0000 meq | Freq: Once | ORAL | Status: AC
  Administered 2016-06-13: 20 meq via ORAL
  Filled 2016-06-13: qty 15

## 2016-06-13 MED ORDER — MAGNESIUM SULFATE 2 GM/50ML IV SOLN
2.0000 g | Freq: Once | INTRAVENOUS | Status: AC
  Administered 2016-06-13: 2 g via INTRAVENOUS
  Filled 2016-06-13: qty 50

## 2016-06-13 MED ORDER — AZTREONAM 2 G IJ SOLR
2.0000 g | Freq: Once | INTRAMUSCULAR | Status: AC
  Administered 2016-06-13: 2 g via INTRAVENOUS
  Filled 2016-06-13: qty 2

## 2016-06-13 MED ORDER — VANCOMYCIN HCL IN DEXTROSE 1-5 GM/200ML-% IV SOLN
1000.0000 mg | Freq: Once | INTRAVENOUS | Status: AC
  Administered 2016-06-13: 1000 mg via INTRAVENOUS
  Filled 2016-06-13: qty 200

## 2016-06-13 MED ORDER — LEVOFLOXACIN IN D5W 500 MG/100ML IV SOLN: 500.0000 mg | INTRAVENOUS | Status: DC

## 2016-06-13 MED ORDER — HYDROCORTISONE NA SUCCINATE PF 100 MG IJ SOLR
50.0000 mg | Freq: Four times a day (QID) | INTRAMUSCULAR | Status: DC
  Administered 2016-06-13 – 2016-06-15 (×7): 50 mg via INTRAVENOUS
  Filled 2016-06-13 (×2): qty 1
  Filled 2016-06-13 (×3): qty 2
  Filled 2016-06-13 (×5): qty 1

## 2016-06-13 MED ORDER — METOPROLOL TARTRATE 25 MG PO TABS
25.0000 mg | ORAL_TABLET | Freq: Two times a day (BID) | ORAL | Status: DC
  Administered 2016-06-13: 25 mg via ORAL
  Filled 2016-06-13: qty 1

## 2016-06-13 MED ORDER — SODIUM CHLORIDE 0.9 % IV SOLN: Freq: Once | INTRAVENOUS | Status: DC

## 2016-06-13 MED ORDER — LORAZEPAM 0.5 MG PO TABS: 0.5000 mg | ORAL_TABLET | Freq: Two times a day (BID) | ORAL | Status: DC | PRN

## 2016-06-13 MED ORDER — ALBUTEROL SULFATE (2.5 MG/3ML) 0.083% IN NEBU
2.5000 mg | INHALATION_SOLUTION | Freq: Four times a day (QID) | RESPIRATORY_TRACT | Status: DC | PRN
Start: 2016-06-13 — End: 2016-06-13

## 2016-06-13 MED ORDER — HYDRALAZINE HCL 20 MG/ML IJ SOLN: 5.0000 mg | INTRAMUSCULAR | Status: DC | PRN

## 2016-06-13 MED ORDER — SODIUM CHLORIDE 0.9 % IV BOLUS (SEPSIS)
1000.0000 mL | Freq: Once | INTRAVENOUS | Status: AC
  Administered 2016-06-13: 2000 mL via INTRAVENOUS

## 2016-06-13 MED ORDER — PANTOPRAZOLE SODIUM 40 MG IV SOLR
8.0000 mg/h | INTRAVENOUS | Status: DC
  Administered 2016-06-13 – 2016-06-14 (×3): 8 mg/h via INTRAVENOUS
  Filled 2016-06-13 (×10): qty 80

## 2016-06-13 MED ORDER — ONDANSETRON HCL 4 MG/2ML IJ SOLN
4.0000 mg | Freq: Four times a day (QID) | INTRAMUSCULAR | Status: DC | PRN
  Administered 2016-06-17: 4 mg via INTRAVENOUS
  Filled 2016-06-13: qty 2

## 2016-06-13 MED ORDER — PANTOPRAZOLE SODIUM 40 MG IV SOLR
40.0000 mg | Freq: Two times a day (BID) | INTRAVENOUS | Status: DC
  Filled 2016-06-13: qty 40

## 2016-06-13 NOTE — Progress Notes (Signed)
Called by Dr. Loreta AveMann who was called by Endo for hypotension.   Upon arrival to [facility] [address], pt appears pale and weak with BP 79/9130mmHg, HR depressed compared to prior. She remains alert and oriented, protecting airway. Pt was given beta blocker this morning in the setting of hypotension, sepsis and hematemesis (though hgb stable and no further hematemesis).   IV NS bolus Called CCM for urgent evaluation  Hazeline Junkeryan Grunz, MD 06/13/2016 12:06 PM

## 2016-06-13 NOTE — H&P (Signed)
History and Physical    [name]:[id number] DOB: 10-Mar-1936 DOA: 06/13/2016  Referring MD/NP/PA:   PCP: Pearson Grippe, MD   Patient coming from:  The patient is coming from home.  At baseline, pt is independent for most of ADL.    Chief Complaint: Cough, fever, chills, nausea, vomiting, diarrhea, hematemesis, generalized weakness  HPI: [name] is a 80 y.o. female with medical history significant of hypertension, anxiety, CKD-III, who presents with cough, fever, chills, nausea, vomiting, diarrhea, hematemesis, generalized weakness.  Patient states that she has been having nausea, vomiting, diarrhea for about 1 week, which has worsened in the past 1 or 2 days. She also has generalized weakness, no unilateral weakness or numbness in extremities. She states that she has had 3 to 4 bowel movement with loose stool at the worst day. She vomited one or twice each day. Patient states that she has occasionally vomited up lack materials, looks like blood. She is taking naproxen at home. No dark stool. She also reports having dry cough, fever and chills, but no chest pain or shortness of breath. No runny nose or sore throat. She denies symptoms of UTI.  ED Course: pt was found to have hemoglobin drop from 13.6 on 01/14/16-->8.2 today, WBC 1.0, negative urinalysis, lactate of 2.91, potassium 3.3, worsening renal function, temperature 11.3, tachycardia, no tachypnea, O2 sat 91% on room air. Chest is issued a mild patchy left basilar opacity. Pt is admitted to telemetry bed as inpatient for further eval and treatment.  Review of Systems:   General: has fevers, chills, no changes in body weight, has poor appetite, has fatigue HEENT: no blurry vision, hearing changes or sore throat Respiratory: no dyspnea, has coughing, no wheezing CV: no chest pain, no palpitations GI: has nausea, vomiting, abdominal pain, diarrhea, hematemesis, no constipation GU: no dysuria, burning on urination, increased  urinary frequency, hematuria  Ext: no leg edema Neuro: no unilateral weakness, numbness, or tingling, no vision change or hearing loss Skin: no rash, no skin tear. MSK: No muscle spasm, no deformity, no limitation of range of movement in spin Heme: No easy bruising.  Travel history: No recent long distant travel.  Allergy:  Allergies  Allergen Reactions  . Amoxicillin Hives and Itching    Has patient had a PCN reaction causing immediate rash, facial/tongue/throat swelling, SOB or lightheadedness with hypotension: Yes Has patient had a PCN reaction causing severe rash involving mucus membranes or skin necrosis: Yes Has patient had a PCN reaction that required hospitalization No Has patient had a PCN reaction occurring within the last 10 years: Yes If all of the above answers are "NO", then may proceed with Cephalosporin use.     Past Medical History:  Diagnosis Date  . Blood transfusion without reported diagnosis   . CKD (chronic kidney disease), stage III   . Hypertension   . PONV (postoperative nausea and vomiting)    nausea  . Thyroid disease     Past Surgical History:  Procedure Laterality Date  . ABDOMINAL HYSTERECTOMY    . CHOLECYSTECTOMY N/A 08/13/2015   Procedure: LAPAROSCOPIC CHOLECYSTECTOMY WITH INTRAOPERATIVE CHOLANGIOGRAM;  Surgeon: Avel Peace, MD;  Location: [facility] OR;  Service: General;  Laterality: N/A;  . THYROIDECTOMY, PARTIAL      Social History:  reports that she has never smoked. She has never used smokeless tobacco. She reports that she does not drink alcohol or use drugs.  Family History:  Family History  Problem Relation Age of Onset  .  Stroke Mother      Prior to Admission medications   Medication Sig Start Date End Date Taking? Authorizing Provider  amLODipine (NORVASC) 10 MG tablet Take 10 mg by mouth daily.   Yes Historical Provider, MD  cloNIDine (CATAPRES) 0.1 MG tablet Take 0.1 mg by mouth daily.   Yes Historical Provider, MD  fish  oil-omega-3 fatty acids 1000 MG capsule Take 1 g by mouth daily.   Yes Historical Provider, MD  LORazepam (ATIVAN) 0.5 MG tablet Take 0.5 mg by mouth 2 (two) times daily as needed. anxiety 07/30/15  Yes Historical Provider, MD  metoprolol tartrate (LOPRESSOR) 25 MG tablet Take 25 mg by mouth 2 (two) times daily.   Yes Historical Provider, MD  naproxen sodium (ANAPROX) 220 MG tablet Take 220 mg by mouth 2 (two) times daily as needed (pain).   Yes Historical Provider, MD    Physical Exam: Vitals:   06/13/16 0515 06/13/16 0530 06/13/16 0545 06/13/16 0709  BP: 108/59 115/58 (!) 108/53 (!) 109/47  Pulse: 97 95 96 95  Resp: 13 15 20 18   Temp:    97.5 F (36.4 C)  TempSrc:    Oral  SpO2: 98% 98% 100% 97%  Weight:    51.8 kg (114 lb 3.2 oz)  Height:       General: Not in acute distress. Pale looking. HEENT:       Eyes: PERRL, EOMI, no scleral icterus.       ENT: No discharge from the ears and nose, no pharynx injection, no tonsillar enlargement.        Neck: No JVD, no bruit, no mass felt. Heme: No neck lymph node enlargement. Cardiac: S1/S2, RRR, No murmurs, No gallops or rubs. Respiratory: No rales, wheezing, rhonchi or rubs. GI: Soft, nondistended, nontender, no rebound pain, no organomegaly, BS present. GU: No hematuria Ext: No pitting leg edema bilaterally. 2+DP/PT pulse bilaterally. Musculoskeletal: No joint deformities, No joint redness or warmth, no limitation of ROM in spin. Skin: No rashes.  Neuro: Alert, oriented X3, cranial nerves II-XII grossly intact, moves all extremities normally. Psych: Patient is not psychotic, no suicidal or hemocidal ideation.  Labs on Admission: I have personally reviewed following labs and imaging studies  CBC:  Recent Labs Lab 06/13/16 0153 06/13/16 0557  WBC 1.0* 8.5  NEUTROABS 0.7*  --   HGB 8.2* 8.1*  HCT 25.4* 24.9*  MCV 95.5 95.4  PLT 209 187   Basic Metabolic Panel:  Recent Labs Lab 06/13/16 0153 06/13/16 0557  NA 139  --     K 3.3*  --   CL 112*  --   CO2 19*  --   GLUCOSE 99  --   BUN 21*  --   CREATININE 1.57*  --   CALCIUM 8.6*  --   MG  --  1.1*   GFR: Estimated Creatinine Clearance: 22.3 mL/min (by C-G formula based on SCr of 1.57 mg/dL). Liver Function Tests:  Recent Labs Lab 06/13/16 0153  AST 25  ALT 15  ALKPHOS 54  BILITOT 0.8  PROT 5.2*  ALBUMIN 3.0*   No results for input(s): LIPASE, AMYLASE in the last 168 hours. No results for input(s): AMMONIA in the last 168 hours. Coagulation Profile:  Recent Labs Lab 06/13/16 0557  INR 1.20   Cardiac Enzymes: No results for input(s): CKTOTAL, CKMB, CKMBINDEX, TROPONINI in the last 168 hours. BNP (last 3 results) No results for input(s): PROBNP in the last 8760 hours. HbA1C: No results for input(s):  HGBA1C in the last 72 hours. CBG: No results for input(s): GLUCAP in the last 168 hours. Lipid Profile: No results for input(s): CHOL, HDL, LDLCALC, TRIG, CHOLHDL, LDLDIRECT in the last 72 hours. Thyroid Function Tests: No results for input(s): TSH, T4TOTAL, FREET4, T3FREE, THYROIDAB in the last 72 hours. Anemia Panel: No results for input(s): VITAMINB12, FOLATE, FERRITIN, TIBC, IRON, RETICCTPCT in the last 72 hours. Urine analysis:    Component Value Date/Time   COLORURINE YELLOW 06/13/2016 0155   APPEARANCEUR CLEAR 06/13/2016 0155   LABSPEC 1.014 06/13/2016 0155   PHURINE 6.0 06/13/2016 0155   GLUCOSEU NEGATIVE 06/13/2016 0155   HGBUR TRACE (A) 06/13/2016 0155   BILIRUBINUR NEGATIVE 06/13/2016 0155   KETONESUR 15 (A) 06/13/2016 0155   PROTEINUR >300 (A) 06/13/2016 0155   UROBILINOGEN 0.2 12/08/2009 1141   NITRITE NEGATIVE 06/13/2016 0155   LEUKOCYTESUR NEGATIVE 06/13/2016 0155   Sepsis Labs: @LABRCNTIP (procalcitonin:4,lacticidven:4) )No results found for this or any previous visit (from the past 240 hour(s)).   Radiological Exams on Admission: Dg Chest Port 1 View  Result Date: 06/13/2016 CLINICAL DATA:  Initial  evaluation for acute cough, fever, nausea and vomiting. EXAM: PORTABLE CHEST 1 VIEW COMPARISON:  Prior radiograph from 01/14/2016. FINDINGS: The cardiac and mediastinal silhouettes are stable in size and contour, and remain within normal limits. Lungs are normally inflated. Changes related to COPD present. Subtle patchy density at the left lung base, which may reflect atelectasis or possibly developing infiltrate. No other focal airspace disease. No pulmonary edema or pleural effusion. No pneumothorax. No acute osseous abnormality identified. Surgical clips overlie the lower neck. IMPRESSION: 1. Mild patchy left basilar opacity. While this finding could reflect atelectasis, possible mild/developing infiltrate could be considered. 2. COPD. Electronically Signed   By: Rise Mu M.D.   On: 06/13/2016 03:11     EKG: Independently reviewed. Sinus rhythm, QTC 428, anteroseptal infarction pattern.   Assessment/Plan Principal Problem:   Sepsis (HCC) Active Problems:   Acute renal failure superimposed on stage 3 chronic kidney disease (HCC)   Hypertension   CAP (community acquired pneumonia)   Nausea vomiting and diarrhea   Neutropenia (HCC)   Anxiety   Hematemesis   Hypokalemia  Nausea, vomiting, diarrhea: Likely due to viral gastroenteritis, but cannot rule out other possibilities, such as C. difficile colitis. -Admit to telemetry bed as inpatient -IV fluid: 1.5 L normal saline, then 100 mL per hour -Check C. difficile PCR and GI pathogen panel  Possible CAP: Patient has dry cough, and oxygen desaturation. Chest x-ray showed mild patchy left basilar opacity, indicating possible CAP. -pt given one dose of vancomycin, aztreonam, and Levaquin in ED-->will continue levaquine. - Mucinex for cough  - prn Albuterol Nebs prn for SOB - Urine legionella and S. pneumococcal antigen - Follow up blood culture x2, sputum culture and respiratory virus panel  Sepsis [facility]): pt is septic with  elevated lactate, neutropenia, fever, tachycardia. This likely due to combination of possible pneumonia and intra-abdominal infection. Currently hemodynamically stable. - will get Procalcitonin and trend lactic acid level per sepsis protocol - IVF: 30 cc/Kg,  Then 100 mL per hour of NS  -f/u blood culture  Hematemesis: Hemoglobin dropped from 13.6-->8.2. Hemodynamically stable. Patient is taking naproxen at home. Pt had EGD in the past, which was normal per patient.  - NPO for possible EGD - Start IV pepcid (need to r/o c diff before we can start pantoprazole IV) - Zofran IV for nausea - hold Naproxen, Avoid NSAIDs and SQ heparin -  Maintain IV access (2 large bore IVs if possible). - Monitor closely and follow q6h cbc, transfuse as necessary. - LaB: INR, PTT, type screen -please call GI in AM.  AoCKD-III: Baseline Cre is 1.1-1.3,, pt's Cre is 1.57, BUN 21 on admission. Likely due to prerenal secondary to dehydration and continuation NSAIDs. - IVF as above - Check FeNa - Follow up renal function by BMP - Hold naproxen  Hypertension: Blood pressure 102/54 -Hold amlodipine and clonidine since patient is at risk of developing hypotension due to sepsis and GI bleeding -Continue Coreg -IV hydralazine when necessary  Anxiety: -prn ativan  Hypokalemia: K= 3.3 on admission. - Repleted   DVT ppx: SCD Code Status: Full code Family Communication: Yes, patient's 2 daughters and husband  at bed side Disposition Plan:  Anticipate discharge back to previous home environment Consults called:  none Admission status: Obs / tele     Date of Service 06/13/2016    Lorretta HarpIU, Leolia Vinzant [facility] Pager [phone]  If 7PM-7AM, please contact night-coverage [email or web address] Password [email or web address] 06/13/2016, 7:36 AM

## 2016-06-13 NOTE — Progress Notes (Signed)
eLink Physician-Brief Progress Note Patient Name: [name] DOB: [birth year] MRN: [id number]   Date of Service  06/13/2016  HPI/Events of Note  Patient c/o generalized body pain. Creatinine = 1.30.  eICU Interventions  Will order Motrin 400 mg PO Q 6 hours PRN pain.      Intervention Category Intermediate Interventions: Pain - evaluation and management  Sommer,Steven Dennard Nipugene 06/13/2016, 9:19 PM

## 2016-06-13 NOTE — Consult Note (Signed)
PULMONARY / CRITICAL CARE MEDICINE   Name: [name] MRN: [id number] DOB: 09/12/1936    ADMISSION DATE:  06/13/2016 CONSULTATION DATE:  06/13/16  REFERRING MD:  Jarvis Newcomer - [facility]  CHIEF COMPLAINT:  Hypotension  HISTORY OF PRESENT ILLNESS:   [name] is a 80 y.o. female with PMH as outlined below.  She was admitted 9/11 with cough, fevers/chills, N/V/D, melena and coffee ground emesis. N/VD had started roughly 1 week earlier and had worsened over the last 1 - 2 days.  Melena had been going on "for a while" but coffee ground emesis began 9/11.   She was being prepared for emergent EGD AM of 9/11 but was found to be hypotensive with SBP in the 70's.  She was given IVF boluses and PCCM was called for assistance.  Of note, she did receive metoprolol earlier that morning.  She does use naproxen and also admits to using goody powders.  In addition to presumed UGIB, additional lab work suggestive of sepsis / septic shock of unclear etiology.    PAST MEDICAL HISTORY :  She  has a past medical history of Blood transfusion without reported diagnosis; CKD (chronic kidney disease), stage III; Hypertension; PONV (postoperative nausea and vomiting); and Thyroid disease.  PAST SURGICAL HISTORY: She  has a past surgical history that includes Abdominal hysterectomy; Thyroidectomy, partial; and Cholecystectomy (N/A, 08/13/2015).  Allergies  Allergen Reactions  . Amoxicillin Hives and Itching    Has patient had a PCN reaction causing immediate rash, facial/tongue/throat swelling, SOB or lightheadedness with hypotension: Yes Has patient had a PCN reaction causing severe rash involving mucus membranes or skin necrosis: Yes Has patient had a PCN reaction that required hospitalization No Has patient had a PCN reaction occurring within the last 10 years: Yes If all of the above answers are "NO", then may proceed with Cephalosporin use.     No current facility-administered medications on file prior to  encounter.    Current Outpatient Prescriptions on File Prior to Encounter  Medication Sig  . amLODipine (NORVASC) 10 MG tablet Take 10 mg by mouth daily.  . cloNIDine (CATAPRES) 0.1 MG tablet Take 0.1 mg by mouth daily.  . fish oil-omega-3 fatty acids 1000 MG capsule Take 1 g by mouth daily.  Marland Kitchen LORazepam (ATIVAN) 0.5 MG tablet Take 0.5 mg by mouth 2 (two) times daily as needed. anxiety  . metoprolol tartrate (LOPRESSOR) 25 MG tablet Take 25 mg by mouth 2 (two) times daily.  . naproxen sodium (ANAPROX) 220 MG tablet Take 220 mg by mouth 2 (two) times daily as needed (pain).    FAMILY HISTORY:  Her indicated that the status of her mother is unknown.    SOCIAL HISTORY: She  reports that she has never smoked. She has never used smokeless tobacco. She reports that she does not drink alcohol or use drugs.  REVIEW OF SYSTEMS:   All negative; except for those that are bolded, which indicate positives.  Constitutional: weight loss, weight gain, night sweats, fevers, chills, fatigue, weakness.  HEENT: headaches, sore throat, sneezing, nasal congestion, post nasal drip, difficulty swallowing, tooth/dental problems, visual complaints, visual changes, ear aches. Neuro: difficulty with speech, weakness, numbness, ataxia. CV:  chest pain, orthopnea, PND, swelling in lower extremities, dizziness, palpitations, syncope.  Resp: cough, hemoptysis, dyspnea, wheezing. GI: heartburn, indigestion, abdominal pain, nausea, vomiting, diarrhea, constipation, change in bowel habits, loss of appetite, hematemesis, melena, hematochezia, coffee ground emesis. GU: dysuria, change in color of urine, urgency or frequency, flank  pain, hematuria. MSK: joint pain or swelling, decreased range of motion. Psych: change in mood or affect, depression, anxiety, suicidal ideations, homicidal ideations. Skin: rash, itching, bruising.   SUBJECTIVE:  Resting comfortably, in NAD.  VITAL SIGNS: BP (!) 97/51 (BP Location: Right  Arm)   Pulse 72   Temp 97.5 F (36.4 C) (Oral)   Resp 15   Ht 5' (1.524 m)   Wt 114 lb 13.8 oz (52.1 kg)   SpO2 100%   BMI 22.43 kg/m   HEMODYNAMICS:    VENTILATOR SETTINGS:    INTAKE / OUTPUT: I/O last 3 completed shifts: In: 1900 [I.V.:1900] Out: 200 [Urine:200]   PHYSICAL EXAMINATION: General: Elderly female, in NAD. Neuro: A&O x 3, non-focal.  HEENT: [place]/AT. PERRL, sclerae anicteric.  MM dry. Cardiovascular: RRR, no M/R/G.  Lungs: Respirations even and unlabored.  CTA bilaterally, No W/R/R. Abdomen: BS x 4, soft, NT/ND.  Musculoskeletal: No gross deformities, no edema.  Skin: Intact, warm, no rashes.  LABS:  BMET  Recent Labs Lab 06/13/16 0153 06/13/16 1054 06/13/16 1232  NA 139 142 143  K 3.3* 4.0 4.7  CL 112* 115* 114*  CO2 19* 18*  --   BUN 21* 19 19  CREATININE 1.57* 1.46* 1.30*  GLUCOSE 99 102* 91    Electrolytes  Recent Labs Lab 06/13/16 0153 06/13/16 0557 06/13/16 1054  CALCIUM 8.6*  --  8.0*  MG  --  1.1*  --     CBC  Recent Labs Lab 06/13/16 0153 06/13/16 0557 06/13/16 1054 06/13/16 1232  WBC 1.0* 8.5 8.5  --   HGB 8.2* 8.1* 7.8* 6.8*  HCT 25.4* 24.9* 23.6* 20.0*  PLT 209 187 201  --     Coag's  Recent Labs Lab 06/13/16 0557  APTT 34  INR 1.20    Sepsis Markers  Recent Labs Lab 06/13/16 0424 06/13/16 0557 06/13/16 0600 06/13/16 1054  LATICACIDVEN 1.50  --  1.3 1.6  PROCALCITON  --  30.85  --   --     ABG No results for input(s): PHART, PCO2ART, PO2ART in the last 168 hours.  Liver Enzymes  Recent Labs Lab 06/13/16 0153  AST 25  ALT 15  ALKPHOS 54  BILITOT 0.8  ALBUMIN 3.0*    Cardiac Enzymes No results for input(s): TROPONINI, PROBNP in the last 168 hours.  Glucose No results for input(s): GLUCAP in the last 168 hours.  Imaging Dg Chest Port 1 View  Result Date: 06/13/2016 CLINICAL DATA:  Initial evaluation for acute cough, fever, nausea and vomiting. EXAM: PORTABLE CHEST 1 VIEW  COMPARISON:  Prior radiograph from 01/14/2016. FINDINGS: The cardiac and mediastinal silhouettes are stable in size and contour, and remain within normal limits. Lungs are normally inflated. Changes related to COPD present. Subtle patchy density at the left lung base, which may reflect atelectasis or possibly developing infiltrate. No other focal airspace disease. No pulmonary edema or pleural effusion. No pneumothorax. No acute osseous abnormality identified. Surgical clips overlie the lower neck. IMPRESSION: 1. Mild patchy left basilar opacity. While this finding could reflect atelectasis, possible mild/developing infiltrate could be considered. 2. COPD. Electronically Signed   By: Rise Mu M.D.   On: 06/13/2016 03:11     STUDIES:  CXR 9/11 > mild left basilar opacity. Renal US 9/11 > EGD 9/12 >  CULTURES: Blood 9/11 > Urine 9/11 > Sputum 9/11 >  ANTIBIOTICS: Vanc 9/11 > Imipenem 9/11 >  SIGNIFICANT EVENTS: 9/11 > admitted with presumed UGIB and shock (  combo of sepsis of unclear etiology as well as hypovolemic) > initially planned for emergent EGD > aborted due to hypotension > transferred to ICU.  LINES/TUBES: None.  DISCUSSION: 80 y.o. female admitted 9/11 with presumed UGIB as well as shock (combo of sepsis of unclear etiology as well as hypovolemic/hemorrhagic).  GI planned for emergent EGD AM 9/11 but this was aborted due to hypotension.  Pt transferred to ICU, given additional IVF's, PRBC, and started on neo-synephrine.  ASSESSMENT / PLAN:  CARDIOVASCULAR A:  Shock - suspect this is a combo of both septic (of unclear etiology) and hypovolemic / hemorrhagic (due to N/V/D and now UGIB). Hx HTN. P:  Continue IVF's, PRBC's. Assess cortisol. Stress steroids empirically. Goal MAP > 65. Neosynephrine PRN for above goals. Will hold off on placing CVL for now and re-assess after PRBC's, IVF's, steroids given. Trend troponin. Hold preadmission amlodipine, clonidine,  lopressor.  INFECTIOUS A:   Shock -  suspect this is a combo of both septic (of unclear etiology) and hypovolemic / hemorrhagic (due to N/V/D and now UGIB).  P:   Continue broad spectrum abx as above (vanc, imipenem). Follow cultures. PCT algorithm.  GASTROINTESTINAL A:   Presumed UGIB - unclear source at this point. GI prophylaxis. Nutrition. P:   GI following, planning for EGD once BP has stabilized. Continue PPI gtt. NPO.  HEMATOLOGIC A:   Acute blood loss anemia - unclear source but presumed UGIB (consider mallory weiss tear given N/V vs PUD from NSAIDs/goody powders). VTE Prophylaxis. P:  Transfuse 2u PRBCs, maintain Hgb > 7. H/H q6hrs x 3. SCD's only. CBC in AM.  RENAL A:   AKI. Hypocalcemia. P:   NS @ 100. Assess renal US. 1g Ca gluconate. BMP in AM.  PULMONARY A: Acute hypoxic respiratory failure. Possible CAP - CXR with small left basilar opacity. P:   Continue supplemental O2 as needed to maintain SpO2 > 92%. Abx per ID section (broad for now given unclear etiology of sepsis). Pulmonary hygiene. CXR in AM.  ENDOCRINE A:   Hx hypothyroidism. P:   Assess TSH.  NEUROLOGIC A:   Hx anxiety. P:   Hold preadmission lorazepam.  Family updated:  Family called by RN, will come by hospital this afternoon.  Interdisciplinary Family Meeting v Palliative Care Meeting:  Due by: 9/18.  CC time: 40 minutes.   Rutherford Guysahul Desai, [place] Sidonie Dickens- [facility] [place] [facility] Pager: [phone]  or [phone] 06/13/2016, 1:31 PM  STAFF NOTE: I, Rory Percyaniel Feinstein, MD FACP have personally reviewed patient's available data, including medical history, events of note, physical examination and test results as part of my evaluation. I have discussed with resident/NP and other care providers such as pharmacist, RN and RRT. In addition, I personally evaluated patient and elicited key findings of: in endo, no distress, hypotension, no bleeding evident,  abdo soft, old scar, lungs clear, appears dry, PCT 30, prior leukopenia now improved, concern is septic shock causing n/v and r/o MW tear vs NDAID induced, unable to scope given hemodynamics, move to ICU stat, change ABX to Imipenem / vanc, follow BC, fllow PCT trend, stat cbc, ensure type and cross, r/o upper bleeding not seen clinically, may need line, MAP 60 goal, okay to start ne through PIV for now Repeat sepsis assessment completed. She received 30 cc/kg, appears dry still bolus 2 liters, again, she may need PRBC, ppi drip for now   The patient is critically ill with multiple organ  systems failure and requires high complexity decision making for assessment and support, frequent evaluation and titration of therapies, application of advanced monitoring technologies and extensive interpretation of multiple databases.   Critical Care Time devoted to patient care services described in this note is 35 Minutes. This time reflects time of care of this signee: Rory Percy, MD FACP. This critical care time does not reflect procedure time, or teaching time or supervisory time of PA/NP/Med student/Med Resident etc but could involve care discussion time. Rest per NP/medical resident whose note is outlined above and that I agree with   Mcarthur Rossetti. Tyson Alias, MD, FACP Pgr: [phone] [facility] 06/13/2016 3:49 PM

## 2016-06-13 NOTE — Progress Notes (Signed)
eLink Physician-Brief Progress Note Patient Name: [name] DOB: Jul 06, 1936 MRN: [id number]   Date of Service  06/13/2016  HPI/Events of Note  Notified of need for DVT prophylaxis. GI bleeding noted.   eICU Interventions  Will order SCD's to bilateral lower extremities.      Intervention Category Intermediate Interventions: Best-practice therapies (e.g. DVT, beta blocker, etc.)  Sommer,Steven Eugene 06/13/2016, 3:33 PM

## 2016-06-13 NOTE — Progress Notes (Signed)
ANTIBIOTIC CONSULT NOTE - INITIAL  Pharmacy Consult:  Vancomycin / Primaxin Indication:  Sepsis  Allergies  Allergen Reactions  . Amoxicillin Hives and Itching    Has patient had a PCN reaction causing immediate rash, facial/tongue/throat swelling, SOB or lightheadedness with hypotension: Yes Has patient had a PCN reaction causing severe rash involving mucus membranes or skin necrosis: Yes Has patient had a PCN reaction that required hospitalization No Has patient had a PCN reaction occurring within the last 10 years: Yes If all of the above answers are "NO", then may proceed with Cephalosporin use.     Patient Measurements: Height: 5' (152.4 cm) Weight: 114 lb 13.8 oz (52.1 kg) IBW/kg (Calculated) : 45.5  Vital Signs: Temp: 97.5 F (36.4 C) (09/11 0709) Temp Source: Oral (09/11 0709) BP: 97/51 (09/11 1247) Pulse Rate: 72 (09/11 1247) Intake/Output from previous day: 09/10 0701 - 09/11 0700 In: 1900 [I.V.:1900] Out: 200 [Urine:200] Intake/Output from this shift: Total I/O In: 30 [I.V.:30] Out: 0   Labs:  Recent Labs  06/13/16 0153 06/13/16 0557 06/13/16 1054 06/13/16 1232  WBC 1.0* 8.5 8.5  --   HGB 8.2* 8.1* 7.8* 6.8*  PLT 209 187 201  --   CREATININE 1.57*  --  1.46* 1.30*   Estimated Creatinine Clearance: 25.6 mL/min (by C-G formula based on SCr of 1.3 mg/dL). No results for input(s): VANCOTROUGH, VANCOPEAK, VANCORANDOM, GENTTROUGH, GENTPEAK, GENTRANDOM, TOBRATROUGH, TOBRAPEAK, TOBRARND, AMIKACINPEAK, AMIKACINTROU, AMIKACIN in the last 72 hours.   Microbiology: No results found for this or any previous visit (from the past 720 hour(s)).  Medical History: Past Medical History:  Diagnosis Date  . Blood transfusion without reported diagnosis   . CKD (chronic kidney disease), stage III   . Hypertension   . PONV (postoperative nausea and vomiting)    nausea  . Thyroid disease       Assessment: 89 YOF presented with worsening N/V/D, hematemesis and  generalized weakness.  Pharmacy consulted to initiate vancomycin and Primaxin for sepsis.  Baseline labs reviewed.  Noted patient received Azactam 2gm, Levaquin 750mg  and vancomycin 1gm IV around 0200 in the ED.  Vanc 9/11 >> Primaxin 9/11 >>  9/11 UCx - 9/11 BCx x2 - 9/11 sputum cx -    Goal of Therapy:  Vancomycin trough level 15-20 mcg/ml   Plan:  - Vanc 500mg  IV Q24H, start tomorrow - Primaxin 500mg  IV Q12H - Monitor renal fxn, clinical progress, vanc trough as indicated - F/U SCDs for VTE px   Lydon Vansickle D. Laney Potashang, PharmD, BCPS Pager:  [phone] 06/13/2016, 1:42 PM

## 2016-06-13 NOTE — ED Triage Notes (Signed)
Pt. arrived with EMS from home reports emesis with diarrhea , chills and fever onset Sunday , received Zofran 4 mg IV and NS bolus 500 ml by EMS PTA with relief .

## 2016-06-13 NOTE — Progress Notes (Addendum)
PROGRESS NOTE  [name]  ZOX:[id number] DOB: 1936-02-23 DOA: 06/13/2016 PCP: Pearson Grippe, MD  Outpatient Specialists: GI, Dr. Nicholes Mango.   Brief Narrative: [name] is a 80 y.o. female with medical history significant of hypertension, anxiety, CKD-III, who presented with cough, fever, rigors, nausea, vomiting, diarrhea, hematemesis, and generalized weakness.  Patient states that she has been having nausea, vomiting, diarrhea for about 1 week, which has worsened in the past 1 or 2 days. Most recent emesis was this morning and was very dark/black. Diarrhea has been 3+ times per day, without blood. She also has generalized weakness and anorexia for the past few days.  Also had dry cough, fever and chills, but no chest pain or shortness of breath. No dysuria.   This morning she was found to have findings consistent with sepsis (leukopenia, lactate elevation, fever, tachycardiaand borderline hypotension). Presumed source was pneumonia with a possible developing infiltrate in the LLL on CXR. Urinalysis had many bacteria, 6-30 WBC and hyaline casts. She was also found with acute anemia (hgb 8.2, was previously normal at 13.6). She was given weight-based IVF resuscitation and broad antibiotics were started. She was admitted for inpatient evaluation of hematemesis/acute anemia and GI symptoms, and treatment for sepsis.   Assessment & Plan: Principal Problem:   Sepsis (HCC) Active Problems:   Acute renal failure superimposed on stage 3 chronic kidney disease (HCC)   Hypertension   CAP (community acquired pneumonia)   Nausea vomiting and diarrhea   Neutropenia (HCC)   Anxiety   Hematemesis   Hypokalemia   Leukopenia  Sepsis: Improving with large volume resuscitation. Fever, leukopenia, and lactate elevation resolved. ?Pneumonia with dry cough, and minimal LLL infiltrate on CXR. Urinalysis (bacteriuria and pyuria) appears infected but no symptoms. PCT 30.85, Lactate 2.91 > 1.3. Currently  hemodynamically stable. - s/p vancomycin, aztreonam, and levaquin in ED; with possible respiratory vs. urinary source of infection, will continue levaquin and follow cultures.  - s/p 30cc/kg now 100cc/hr, will consider decreasing once taking adequate po - f/u blood and urine cultures - Due to history of partial thyroidectomy, check TSH   Hematemesis: In setting of ongoing emesis, MW tear vs. bleeding ulcer with chronic use of NSAIDs for fibromyalgia. Hemoglobin dropped from 13.6-->8.2 though chronicity of this is not completely clear. BUN only 21. Hemodynamically stable. - Maintain IV access (2 large bore IVs if possible), telemetry monitoring. Ok to stay on floor. - Type and screen performed, INR 1.2 - CBC q6h: stable 8.2 > 8.1. If 3rd is stable and no further bleeding, will D/C serial blood draws.  - GI, Dr. Loreta Ave consulted.  - NPO for possible EGD - On IV pepcid (need to r/o c diff before we can start pantoprazole IV) - Zofran ODT prn nausea - Avoid NSAIDs  Dehydration: As evidence by tachycardia, hypotension, AKI, hyaline casts on UA, in setting of prolonged N/V/D with anorexia.  - Hypokalemia: Repleted, recheck in PM with CBC lab draw - Hypomagnesemia: Replete; electrolyte abnormalities obviously from GI loss - IVF as above - PT consult for diffuse weakness which is likely due to acute illness (previously fully independent)  Diarrhea: Likely viral gastroenteritis given emesis and prolonged duration.  - C. diff and GI pathogen panel PCR are pending (no BM for sample since order) - Serial abdominal exams: Completely benign this AM.   Possible CAP: Patient has dry cough, and oxygen desaturation to 91%. Chest x-ray showed mild patchy left basilar opacity, indicating possible CAP. - abx as above -  Wean O2 as able - No sputum for culture, Respiratory viral panel ordered at admission - Mucinex prn cough - Albuterol nebs prn SOB - Urine legionella and S. pneumococcal  antigen  AoCKD-III: Prerenal azotemia due to dehydration as above, Cr elevated to 1.57 at admission (baseline 1.1-1.3) , pt's Cre is 1.57, BUN 21 on admission. CKD likely due to HTN and years of daily NSAID use.  - IVF as above - Check FENa - Follow up renal function by BMP - Hold NSAIDs  Hypertension: Chronic, stable, uncontrolled per family report. Currently hypotensive to due hypovolemia.  - Hold clonidine, norvasc, and metoprolol until BP improves.   Anxiety: Chronic, stable.  - Home ativan continued  DVT prophylaxis: SCDs with possible active GI bleed Code Status: Full Family Communication: Husband, John, and Daughter, Scherry Ran, at bedside this morning.  Disposition Plan: Further evaluation of acute medical issues, PT eval; Anticipate discharge back to home in 2 - 3 days.   Consultants:   GI, Dr. Loreta Ave  Procedures:   None  Antimicrobials:  Vancomycin, aztreonam x1 9/11  Levaquin (9/11 >> )  Subjective: Pt diffusely weak without focal weakness or change in speech. Last emesis was en route to hospital, black. Last BM was 2 days ago, no blood, also was black. Not hungry, no abd pain, chest pain, or dyspnea reported to me. Denies wounds, dysuria. Decreased UOP lately.   Objective: Vitals:   06/13/16 0515 06/13/16 0530 06/13/16 0545 06/13/16 0709  BP: 108/59 115/58 (!) 108/53 (!) 109/47  Pulse: 97 95 96 95  Resp: 13 15 20 18   Temp:    97.5 F (36.4 C)  TempSrc:    Oral  SpO2: 98% 98% 100% 97%  Weight:    51.8 kg (114 lb 3.2 oz)  Height:        Intake/Output Summary (Last 24 hours) at 06/13/16 1028 Last data filed at 06/13/16 0335  Gross per 24 hour  Intake             1900 ml  Output              200 ml  Net             1700 ml   Filed Weights   06/13/16 0132 06/13/16 0709  Weight: 47.6 kg (105 lb) 51.8 kg (114 lb 3.2 oz)    Examination: General exam: Frail, pale 80 y.o. female in no distress Respiratory system: Non-labored breathing. Clear to  auscultation bilaterally.  Cardiovascular system: Regular rate ~95 and rhythm. No murmur, rub, or gallop. No JVD, and no pedal edema. Gastrointestinal system: Abdomen soft, non-tender, non-distended, with normoactive bowel sounds. No organomegaly or masses felt. Central nervous system: Alert and oriented. No focal neurological deficits. Extremities: Warm, no deformities Skin: No rashes, lesions no ulcers/wounds anywhere. Psychiatry: Judgement and insight appear normal. Mood & affect appropriate.   Data Reviewed: I have personally reviewed following labs and imaging studies  CBC:  Recent Labs Lab 06/13/16 0153 06/13/16 0557  WBC 1.0* 8.5  NEUTROABS 0.7*  --   HGB 8.2* 8.1*  HCT 25.4* 24.9*  MCV 95.5 95.4  PLT 209 187   Basic Metabolic Panel:  Recent Labs Lab 06/13/16 0153 06/13/16 0557  NA 139  --   K 3.3*  --   CL 112*  --   CO2 19*  --   GLUCOSE 99  --   BUN 21*  --   CREATININE 1.57*  --   CALCIUM 8.6*  --  MG  --  1.1*   GFR: Estimated Creatinine Clearance: 22.3 mL/min (by C-G formula based on SCr of 1.57 mg/dL). Liver Function Tests:  Recent Labs Lab 06/13/16 0153  AST 25  ALT 15  ALKPHOS 54  BILITOT 0.8  PROT 5.2*  ALBUMIN 3.0*   No results for input(s): LIPASE, AMYLASE in the last 168 hours. No results for input(s): AMMONIA in the last 168 hours. Coagulation Profile:  Recent Labs Lab 06/13/16 0557  INR 1.20   Cardiac Enzymes: No results for input(s): CKTOTAL, CKMB, CKMBINDEX, TROPONINI in the last 168 hours. BNP (last 3 results) No results for input(s): PROBNP in the last 8760 hours. HbA1C: No results for input(s): HGBA1C in the last 72 hours. CBG: No results for input(s): GLUCAP in the last 168 hours. Lipid Profile: No results for input(s): CHOL, HDL, LDLCALC, TRIG, CHOLHDL, LDLDIRECT in the last 72 hours. Thyroid Function Tests: No results for input(s): TSH, T4TOTAL, FREET4, T3FREE, THYROIDAB in the last 72 hours. Anemia Panel: No  results for input(s): VITAMINB12, FOLATE, FERRITIN, TIBC, IRON, RETICCTPCT in the last 72 hours. Urine analysis:    Component Value Date/Time   COLORURINE YELLOW 06/13/2016 0155   APPEARANCEUR CLEAR 06/13/2016 0155   LABSPEC 1.014 06/13/2016 0155   PHURINE 6.0 06/13/2016 0155   GLUCOSEU NEGATIVE 06/13/2016 0155   HGBUR TRACE (A) 06/13/2016 0155   BILIRUBINUR NEGATIVE 06/13/2016 0155   KETONESUR 15 (A) 06/13/2016 0155   PROTEINUR >300 (A) 06/13/2016 0155   UROBILINOGEN 0.2 12/08/2009 1141   NITRITE NEGATIVE 06/13/2016 0155   LEUKOCYTESUR NEGATIVE 06/13/2016 0155   Sepsis Labs: @LABRCNTIP (procalcitonin:4,lacticidven:4)  )No results found for this or any previous visit (from the past 240 hour(s)).   Radiology Studies: Dg Chest Port 1 View  Result Date: 06/13/2016 CLINICAL DATA:  Initial evaluation for acute cough, fever, nausea and vomiting. EXAM: PORTABLE CHEST 1 VIEW COMPARISON:  Prior radiograph from 01/14/2016. FINDINGS: The cardiac and mediastinal silhouettes are stable in size and contour, and remain within normal limits. Lungs are normally inflated. Changes related to COPD present. Subtle patchy density at the left lung base, which may reflect atelectasis or possibly developing infiltrate. No other focal airspace disease. No pulmonary edema or pleural effusion. No pneumothorax. No acute osseous abnormality identified. Surgical clips overlie the lower neck. IMPRESSION: 1. Mild patchy left basilar opacity. While this finding could reflect atelectasis, possible mild/developing infiltrate could be considered. 2. COPD. Electronically Signed   By: Rise MuBenjamin  McClintock M.D.   On: 06/13/2016 03:11    Scheduled Meds: . famotidine (PEPCID) IV  20 mg Intravenous Q12H  . [START ON 06/15/2016] levofloxacin (LEVAQUIN) IV  500 mg Intravenous Q48H  . magnesium sulfate 1 - 4 g bolus IVPB  2 g Intravenous Once  . metoprolol tartrate  25 mg Oral BID  . omega-3 acid ethyl esters  1 g Oral Daily    Continuous Infusions: . sodium chloride 100 mL/hr at 06/13/16 0617     LOS: 0 days   Time spent: 25 minutes.  Hazeline Junkeryan Samual Beals, MD [facility] Pager [phone]  If 7PM-7AM, please contact night-coverage [email or web address] Password [email or web address] 06/13/2016, 10:28 AM

## 2016-06-13 NOTE — Progress Notes (Signed)
Patient admitted to Endo unit, room 2. Patient placed on monitor and vital signs obtained. Patient with a HR of 72 and BP of 78/34. BP cuff changed and continued readings of 70's/30's obtained. Patient alert and oriented entire time in Endoscopy. Procedural MD called and gave orders to run NS bolus and that she would contact the hospitalist. Bedside RN called to verify no other BP's had been taken since 0709 this am and not documented. Response of no other BP's since am VS was given. Rapid response RN called to provide support (see rapid response RN note). Patient HOB lowered and NS rate increased to 999. MD @ bedside and will contact CCM. GI MD @ bedside. CCM MD at bedside, gave order to check ISTAT and orders for BP support. ICU bed given to Rapid Response RN and report called to ICU RN. Family updated and sent to waiting room for 11M.      1232 Critical HGB from ISTAT of 6.8. GI MD aware and ICU RN made aware to let CCM know.

## 2016-06-13 NOTE — Progress Notes (Signed)
eLink Physician-Brief Progress Note Patient Name: [name] DOB: 01-Mar-1936 MRN: [id number]   Date of Service  06/13/2016  HPI/Events of Note  Troponin #1 = 0.75.   eICU Interventions  Will order: 1. ASA Suppository 150 mg PR now and Q day.  Continue to trend Troponin.      Intervention Category Intermediate Interventions: Diagnostic test evaluation  Sommer,Steven Dennard Nipugene 06/13/2016, 8:03 PM

## 2016-06-13 NOTE — Procedures (Signed)
Central Venous Catheter Insertion Procedure Note Nicie Heldnn V Rundell [id number] [birth year]  Procedure: Insertion of Central Venous Catheter Indications: Assessment of intravascular volume, Drug and/or fluid administration and Frequent blood sampling  Procedure Details Consent: Risks of procedure as well as the alternatives and risks of each were explained to the (patient/caregiver).  Consent for procedure obtained. Time Out: Verified patient identification, verified procedure, site/side was marked, verified correct patient position, special equipment/implants available, medications/allergies/relevent history reviewed, required imaging and test results available.  Performed Real time US was used to ID and cannulate the vessel  Maximum sterile technique was used including antiseptics, cap, gloves, gown, hand hygiene, mask and sheet. Skin prep: Chlorhexidine; local anesthetic administered A antimicrobial bonded/coated triple lumen catheter was placed in the right internal jugular vein using the Seldinger technique.  Evaluation Blood flow good Complications: No apparent complications Patient did tolerate procedure well. Chest X-ray ordered to verify placement.  CXR: pending.  Shelby Mattocksete E Irisa Grimsley 06/13/2016, 4:30 PM  Simonne MartinetPeter E Chyane Greer ACNP-BC [facility] Pulmonary/Critical [facility] Pager # [phone] OR # [phone] if no answer

## 2016-06-13 NOTE — Consult Note (Signed)
Reason for Consult: Coffee-ground emesis and melena Referring Physician: [facility]  Dorita Held HPI: This is a 80 year old female with a PMH of CKD, HTN, and anxiety admitted for complaints of melena and coffee-ground emesis.  She reports that she started to experience melena "for a while", but then she started with coffee-ground emesis today as well as fever and rigors.  Upon presentation her WBC was noted to be 1.0, but then it increased up to 8.  She typically has an SBP in the 200 range, but her blood pressure was around 109 mmHg and she received metoprolol.  Currently her blood pressure is in the 70's/30's and an HR in the 70's.  The intention was to perform an emergent EGD, but her BP precludes any intervention at this time.  She admits to using NSAIDs on a routine basis for her body aches and she typically uses naprosyn.  No complaints about chest pain or SOB.  Past Medical History:  Diagnosis Date  . Blood transfusion without reported diagnosis   . CKD (chronic kidney disease), stage III   . Hypertension   . PONV (postoperative nausea and vomiting)    nausea  . Thyroid disease     Past Surgical History:  Procedure Laterality Date  . ABDOMINAL HYSTERECTOMY    . CHOLECYSTECTOMY N/A 08/13/2015   Procedure: LAPAROSCOPIC CHOLECYSTECTOMY WITH INTRAOPERATIVE CHOLANGIOGRAM;  Surgeon: Avel Peace, MD;  Location[facility] OR;  Service: General;  Laterality: N/A;  . THYROIDECTOMY, PARTIAL      Family History  Problem Relation Age of Onset  . Stroke Mother     Social History:  reports that she has never smoked. She has never used smokeless tobacco. She reports that she does not drink alcohol or use drugs.  Allergies:  Allergies  Allergen Reactions  . Amoxicillin Hives and Itching    Has patient had a PCN reaction causing immediate rash, facial/tongue/throat swelling, SOB or lightheadedness with hypotension: Yes Has patient had a PCN reaction causing severe rash involving mucus  membranes or skin necrosis: Yes Has patient had a PCN reaction that required hospitalization No Has patient had a PCN reaction occurring within the last 10 years: Yes If all of the above answers are "NO", then may proceed with Cephalosporin use.     Medications:  Scheduled: . [MAR Hold] famotidine (PEPCID) IV  20 mg Intravenous Q12H  . [MAR Hold] levofloxacin (LEVAQUIN) IV  500 mg Intravenous Q48H  . [MAR Hold] omega-3 acid ethyl esters  1 g Oral Daily  . sodium chloride  1,000 mL Intravenous Once   Continuous: . sodium chloride 100 mL/hr at 06/13/16 0617  . phenylephrine (NEO-SYNEPHRINE) Adult infusion 20 mcg/min (06/13/16 1227)    Results for orders placed or performed during the hospital encounter of 06/13/16 (from the past 24 hour(s))  Comprehensive metabolic panel     Status: Abnormal   Collection Time: 06/13/16  1:53 AM  Result Value Ref Range   Sodium 139 135 - 145 mmol/L   Potassium 3.3 (L) 3.5 - 5.1 mmol/L   Chloride 112 (H) 101 - 111 mmol/L   CO2 19 (L) 22 - 32 mmol/L   Glucose, Bld 99 65 - 99 mg/dL   BUN 21 (H) 6 - 20 mg/dL   Creatinine, Ser 1.61 (H) 0.44 - 1.00 mg/dL   Calcium 8.6 (L) 8.9 - 10.3 mg/dL   Total Protein 5.2 (L) 6.5 - 8.1 g/dL   Albumin 3.0 (L) 3.5 - 5.0 g/dL   AST  25 15 - 41 U/L   ALT 15 14 - 54 U/L   Alkaline Phosphatase 54 38 - 126 U/L   Total Bilirubin 0.8 0.3 - 1.2 mg/dL   GFR calc non Af Amer 30 (L) >60 mL/min   GFR calc Af Amer 35 (L) >60 mL/min   Anion gap 8 5 - 15  CBC WITH DIFFERENTIAL     Status: Abnormal   Collection Time: 06/13/16  1:53 AM  Result Value Ref Range   WBC 1.0 (LL) 4.0 - 10.5 K/uL   RBC 2.66 (L) 3.87 - 5.11 MIL/uL   Hemoglobin 8.2 (L) 12.0 - 15.0 g/dL   HCT 09.6 (L) 04.5 - 40.9 %   MCV 95.5 78.0 - 100.0 fL   MCH 30.8 26.0 - 34.0 pg   MCHC 32.3 30.0 - 36.0 g/dL   RDW 81.1 91.4 - 78.2 %   Platelets 209 150 - 400 K/uL   Neutrophils Relative % 73 %   Neutro Abs 0.7 (L) 1.7 - 7.7 K/uL   Lymphocytes Relative 21 %    Lymphs Abs 0.2 (L) 0.7 - 4.0 K/uL   Monocytes Relative 1 %   Monocytes Absolute 0.0 (L) 0.1 - 1.0 K/uL   Eosinophils Relative 4 %   Eosinophils Absolute 0.0 0.0 - 0.7 K/uL   Basophils Relative 2 %   Basophils Absolute 0.0 0.0 - 0.1 K/uL  Urinalysis, Routine w reflex microscopic (not at [facility])     Status: Abnormal   Collection Time: 06/13/16  1:55 AM  Result Value Ref Range   Color, Urine YELLOW YELLOW   APPearance CLEAR CLEAR   Specific Gravity, Urine 1.014 1.005 - 1.030   pH 6.0 5.0 - 8.0   Glucose, UA NEGATIVE NEGATIVE mg/dL   Hgb urine dipstick TRACE (A) NEGATIVE   Bilirubin Urine NEGATIVE NEGATIVE   Ketones, ur 15 (A) NEGATIVE mg/dL   Protein, ur >956 (A) NEGATIVE mg/dL   Nitrite NEGATIVE NEGATIVE   Leukocytes, UA NEGATIVE NEGATIVE  Urine microscopic-add on     Status: Abnormal   Collection Time: 06/13/16  1:55 AM  Result Value Ref Range   Squamous Epithelial / LPF 0-5 (A) NONE SEEN   WBC, UA 6-30 0 - 5 WBC/hpf   RBC / HPF 6-30 0 - 5 RBC/hpf   Bacteria, UA MANY (A) NONE SEEN   Casts HYALINE CASTS (A) NEGATIVE  I-Stat CG4 Lactic Acid, ED  (not at  [facility])     Status: Abnormal   Collection Time: 06/13/16  1:56 AM  Result Value Ref Range   Lactic Acid, Venous 2.91 (HH) 0.5 - 1.9 mmol/L   Comment NOTIFIED PHYSICIAN   Type and screen     Status: None   Collection Time: 06/13/16  2:09 AM  Result Value Ref Range   ABO/RH(D) O POS    Antibody Screen NEG    Sample Expiration 06/16/2016   ABO/Rh     Status: None   Collection Time: 06/13/16  2:09 AM  Result Value Ref Range   ABO/RH(D) O POS   I-Stat CG4 Lactic Acid, ED  (not at  [facility])     Status: None   Collection Time: 06/13/16  4:24 AM  Result Value Ref Range   Lactic Acid, Venous 1.50 0.5 - 1.9 mmol/L  Magnesium     Status: Abnormal   Collection Time: 06/13/16  5:57 AM  Result Value Ref Range   Magnesium 1.1 (L) 1.7 - 2.4 mg/dL  Procalcitonin  Status: None   Collection Time: 06/13/16  5:57 AM  Result Value Ref Range    Procalcitonin 30.85 ng/mL  Protime-INR     Status: None   Collection Time: 06/13/16  5:57 AM  Result Value Ref Range   Prothrombin Time 15.2 11.4 - 15.2 seconds   INR 1.20   APTT     Status: None   Collection Time: 06/13/16  5:57 AM  Result Value Ref Range   aPTT 34 24 - 36 seconds  CBC     Status: Abnormal   Collection Time: 06/13/16  5:57 AM  Result Value Ref Range   WBC 8.5 4.0 - 10.5 K/uL   RBC 2.61 (L) 3.87 - 5.11 MIL/uL   Hemoglobin 8.1 (L) 12.0 - 15.0 g/dL   HCT 40.924.9 (L) 81.136.0 - 91.446.0 %   MCV 95.4 78.0 - 100.0 fL   MCH 31.0 26.0 - 34.0 pg   MCHC 32.5 30.0 - 36.0 g/dL   RDW 78.214.6 95.611.5 - 21.315.5 %   Platelets 187 150 - 400 K/uL  Lactic acid, plasma     Status: None   Collection Time: 06/13/16  6:00 AM  Result Value Ref Range   Lactic Acid, Venous 1.3 0.5 - 1.9 mmol/L  Lactic acid, plasma     Status: None   Collection Time: 06/13/16 10:54 AM  Result Value Ref Range   Lactic Acid, Venous 1.6 0.5 - 1.9 mmol/L  CBC     Status: Abnormal   Collection Time: 06/13/16 10:54 AM  Result Value Ref Range   WBC 8.5 4.0 - 10.5 K/uL   RBC 2.46 (L) 3.87 - 5.11 MIL/uL   Hemoglobin 7.8 (L) 12.0 - 15.0 g/dL   HCT 08.623.6 (L) 57.836.0 - 46.946.0 %   MCV 95.9 78.0 - 100.0 fL   MCH 31.7 26.0 - 34.0 pg   MCHC 33.1 30.0 - 36.0 g/dL   RDW 62.914.9 52.811.5 - 41.315.5 %   Platelets 201 150 - 400 K/uL     Dg Chest Port 1 View  Result Date: 06/13/2016 CLINICAL DATA:  Initial evaluation for acute cough, fever, nausea and vomiting. EXAM: PORTABLE CHEST 1 VIEW COMPARISON:  Prior radiograph from 01/14/2016. FINDINGS: The cardiac and mediastinal silhouettes are stable in size and contour, and remain within normal limits. Lungs are normally inflated. Changes related to COPD present. Subtle patchy density at the left lung base, which may reflect atelectasis or possibly developing infiltrate. No other focal airspace disease. No pulmonary edema or pleural effusion. No pneumothorax. No acute osseous abnormality identified.  Surgical clips overlie the lower neck. IMPRESSION: 1. Mild patchy left basilar opacity. While this finding could reflect atelectasis, possible mild/developing infiltrate could be considered. 2. COPD. Electronically Signed   By: Rise MuBenjamin  McClintock M.D.   On: 06/13/2016 03:11    ROS:  As stated above in the HPI otherwise negative.  Blood pressure (!) 79/30, pulse 73, temperature 97.5 F (36.4 C), temperature source Oral, resp. rate 17, height 5\' 1"  (1.549 m), weight 51.8 kg (114 lb 3.2 oz), SpO2 99 %.    PE: Gen: NAD, Alert and Oriented, weak and fatigued appearing HEENT:  [place]/AT, EOMI Neck: Supple, no LAD Lungs: CTA Bilaterally CV: RRR without M/G/R ABM: Soft, NTND, +BS Ext: No C/C/E  Assessment/Plan: 1) Coffee-ground emesis. 2) Melena. 3) Hypotension.   Her blood loss and the possibility of sepsis resulted in a relative hypotension, but it was compounded with the use of metoprolol.  From the GI standpoint she appears  to be stable, i.e., no hematemesis or hematochezia, but an emergent EGD can be pursued if the clinical situation warrants.  I am anticipating that her BP will increase when the effects of metoprolol wear off.  She will require the EGD at some point and hopefully it will not be an emergent procedure.  Current HGB is at 6.8 g/dL, which is a 1 gram drop from the last value.  She also received a 1 liter bolus of fluids for her hypotension.  Plan: 1) PPI drip. 2) Follow CBC. 3) Transfuse as necessary. 4) Antibiotics per CCM. 5) EGD, at latest, tomorrow.  Massey Ruhland D 06/13/2016, 12:21 PM

## 2016-06-13 NOTE — Progress Notes (Signed)
PT Cancellation Note  Patient Details Name: [name] MRN: [id number] DOB: [birth year]   Cancelled Treatment:    Reason Eval/Treat Not Completed: Patient at procedure or test/unavailable   To Endo;   Will follow up later today as time allows;  Otherwise, will follow up for PT tomorrow;   Thank you,  Van ClinesHolly Juanna Pudlo, PT  Acute Rehabilitation Services Pager [phone] Office [phone]     Van ClinesGarrigan, Yatzil Clippinger [facility] 06/13/2016, 11:26 AM

## 2016-06-13 NOTE — ED Provider Notes (Addendum)
[facility]-EMERGENCY DEPT Provider Note   CSN: [id number] Arrival date & time: 06/13/16  0116  By signing my name below, I, Soijett Blue, attest that this documentation has been prepared under the direction and in the presence of Sandar Krinke, MD. Electronically Signed: Soijett Blue, ED Scribe. 06/13/16. 1:44 AM.    History   Chief Complaint Chief Complaint  Patient presents with  . Emesis  . Diarrhea    HPI [name] is a 80 y.o. female with a PMHx of HTN, thyroid dx, who presents to the Emergency Department brought in by EMS complaining of vomiting x 1 episode daily onset 8 days ago. Pt thought that she had food poisoning or stomach virus due to her excessive vomiting. Pt denies sick contacts, recent medication changes, or recent vaccinations. Pt states that she was around her great grandchildren prior to the onset of her symptoms, one of whom just started preschool. Pt reports that she was able to keep down her medications. Pt is having associated symptoms of weakness, appetite change, generalized body aches, intermittent diarrhea x dark semi-liquid stools with 1 episode daily, cough, and teeth chattering. She notes that she has tried TUMS for the relief of her symptoms. She denies dysuria, fever, and any other symptoms.     The history is provided by the patient.  Emesis   This is a new problem. The current episode started more than 1 week ago. Episode frequency: several the first day then once daily. The problem has not changed since onset.The emesis has an appearance of stomach contents. There has been no fever. Associated symptoms include cough, diarrhea and myalgias. Pertinent negatives include no chills and no fever. Risk factors: none.  Diarrhea   Associated symptoms include vomiting, myalgias and cough. Pertinent negatives include no chills.    Past Medical History:  Diagnosis Date  . Blood transfusion without reported diagnosis   . Hypertension   . PONV (postoperative  nausea and vomiting)    nausea  . Thyroid disease     There are no active problems to display for this patient.   Past Surgical History:  Procedure Laterality Date  . ABDOMINAL HYSTERECTOMY    . CHOLECYSTECTOMY N/A 08/13/2015   Procedure: LAPAROSCOPIC CHOLECYSTECTOMY WITH INTRAOPERATIVE CHOLANGIOGRAM;  Surgeon: Avel Peace, MD;  Location: [facility] OR;  Service: General;  Laterality: N/A;  . THYROIDECTOMY, PARTIAL      OB History    No data available       Home Medications    Prior to Admission medications   Medication Sig Start Date End Date Taking? Authorizing Provider  amLODipine (NORVASC) 10 MG tablet Take 10 mg by mouth daily.    Historical Provider, MD  cloNIDine (CATAPRES) 0.1 MG tablet Take 0.1 mg by mouth daily.    Historical Provider, MD  fish oil-omega-3 fatty acids 1000 MG capsule Take 1 g by mouth daily.    Historical Provider, MD  HYDROcodone-acetaminophen (NORCO/VICODIN) 5-325 MG tablet Take 1 tablet by mouth every 6 (six) hours as needed for severe pain. 01/14/16   Gwyneth Sprout, MD  lidocaine (LIDODERM) 5 % Place 1 patch onto the skin daily. Remove & Discard patch within 12 hours or as directed by MD 01/14/16   Gwyneth Sprout, MD  LORazepam (ATIVAN) 0.5 MG tablet Take 0.5 mg by mouth 2 (two) times daily as needed. anxiety 07/30/15   Historical Provider, MD  metoprolol tartrate (LOPRESSOR) 25 MG tablet Take 25 mg by mouth 2 (two) times daily.    Historical  Provider, MD  naproxen sodium (ANAPROX) 220 MG tablet Take 220 mg by mouth 2 (two) times daily as needed (pain).    Historical Provider, MD  ondansetron (ZOFRAN) 4 MG tablet Take 4 mg by mouth every 8 (eight) hours. 07/08/15   Historical Provider, MD  ondansetron (ZOFRAN) 4 MG tablet Take 1 tablet (4 mg total) by mouth every 8 (eight) hours as needed for nausea. 08/13/15   Avel Peace, MD  predniSONE (DELTASONE) 20 MG tablet Take 2 tablets (40 mg total) by mouth daily. 01/14/16   Gwyneth Sprout, MD  traMADol  (ULTRAM) 50 MG tablet Take 50 mg by mouth every 8 (eight) hours as needed. pain 06/01/15   Historical Provider, MD    Family History Family History  Problem Relation Age of Onset  . Stroke Mother     Social History Social History  Substance Use Topics  . Smoking status: Never Smoker  . Smokeless tobacco: Never Used  . Alcohol use No     Allergies   Amoxicillin   Review of Systems Review of Systems  Constitutional: Positive for appetite change. Negative for chills and fever.  Respiratory: Positive for cough.   Gastrointestinal: Positive for diarrhea and vomiting.  Genitourinary: Negative for dysuria.  Musculoskeletal: Positive for myalgias.  All other systems reviewed and are negative.    Physical Exam Updated Vital Signs BP 116/59 (BP Location: Left Arm)   Pulse 105   Temp 101.3 F (38.5 C) (Oral)   Resp 16   Ht 5\' 1"  (1.549 m)   Wt 105 lb (47.6 kg)   SpO2 97%   BMI 19.84 kg/m   Physical Exam  Constitutional: She is oriented to person, place, and time. She appears well-developed and well-nourished. No distress.  HENT:  Head: Normocephalic and atraumatic.  Mouth/Throat: Oropharynx is clear and moist. No oropharyngeal exudate.  Eyes: Conjunctivae and EOM are normal. Pupils are equal, round, and reactive to light.  lenses implant after 2001 that is intact.   Neck: Trachea normal and normal range of motion. Neck supple. No JVD present. Carotid bruit is not present. No tracheal deviation present.  Trachea midline. No stridor. No bruits.   Cardiovascular: Regular rhythm and normal heart sounds.  Tachycardia present.  Exam reveals no gallop and no friction rub.   No murmur heard. Pulmonary/Chest: Effort normal and breath sounds normal. No stridor. No respiratory distress. She has no wheezes. She has no rales.  Abdominal: Soft. Bowel sounds are normal. She exhibits no distension and no mass. There is no tenderness. There is no rebound and no guarding.    Musculoskeletal: Normal range of motion.  Lymphadenopathy:    She has no cervical adenopathy.  Neurological: She is alert and oriented to person, place, and time. She has normal reflexes. She displays normal reflexes.  DTR's intact.   Skin: Skin is warm and dry. Capillary refill takes less than 2 seconds. She is not diaphoretic.  Psychiatric: She has a normal mood and affect. Her behavior is normal.  Nursing note and vitals reviewed.    ED Treatments / Results  DIAGNOSTIC STUDIES: Oxygen Saturation is 97% on RA, nl by my interpretation.    COORDINATION OF CARE: 1:41 AM Discussed treatment plan with pt at bedside which includes labs, UA, abx, and pt agreed to plan.   Labs (all labs ordered are listed, but only abnormal results are displayed) Labs Reviewed - No data to display  EKG  EKG Interpretation None       Radiology  No results found.  Procedures Procedures (including critical care time)  Medications Ordered in ED Medications - No data to display   Initial Impression / Assessment and Plan / ED Course  I have reviewed the triage vital signs and the nursing notes.  Pertinent labs & imaging results that were available during my care of the patient were reviewed by me and considered in my medical decision making (see chart for details).  Vitals:   06/13/16 0515 06/13/16 0530  BP: 108/59 115/58  Pulse: 97 95  Resp: 13 15  Temp:     Results for orders placed or performed during the hospital encounter of 06/13/16  Comprehensive metabolic panel  Result Value Ref Range   Sodium 139 135 - 145 mmol/L   Potassium 3.3 (L) 3.5 - 5.1 mmol/L   Chloride 112 (H) 101 - 111 mmol/L   CO2 19 (L) 22 - 32 mmol/L   Glucose, Bld 99 65 - 99 mg/dL   BUN 21 (H) 6 - 20 mg/dL   Creatinine, Ser 9.601.57 (H) 0.44 - 1.00 mg/dL   Calcium 8.6 (L) 8.9 - 10.3 mg/dL   Total Protein 5.2 (L) 6.5 - 8.1 g/dL   Albumin 3.0 (L) 3.5 - 5.0 g/dL   AST 25 15 - 41 U/L   ALT 15 14 - 54 U/L    Alkaline Phosphatase 54 38 - 126 U/L   Total Bilirubin 0.8 0.3 - 1.2 mg/dL   GFR calc non Af Amer 30 (L) >60 mL/min   GFR calc Af Amer 35 (L) >60 mL/min   Anion gap 8 5 - 15  CBC WITH DIFFERENTIAL  Result Value Ref Range   WBC 1.0 (LL) 4.0 - 10.5 K/uL   RBC 2.66 (L) 3.87 - 5.11 MIL/uL   Hemoglobin 8.2 (L) 12.0 - 15.0 g/dL   HCT 45.425.4 (L) 09.836.0 - 11.946.0 %   MCV 95.5 78.0 - 100.0 fL   MCH 30.8 26.0 - 34.0 pg   MCHC 32.3 30.0 - 36.0 g/dL   RDW 14.714.5 82.911.5 - 56.215.5 %   Platelets 209 150 - 400 K/uL   Neutrophils Relative % 73 %   Neutro Abs 0.7 (L) 1.7 - 7.7 K/uL   Lymphocytes Relative 21 %   Lymphs Abs 0.2 (L) 0.7 - 4.0 K/uL   Monocytes Relative 1 %   Monocytes Absolute 0.0 (L) 0.1 - 1.0 K/uL   Eosinophils Relative 4 %   Eosinophils Absolute 0.0 0.0 - 0.7 K/uL   Basophils Relative 2 %   Basophils Absolute 0.0 0.0 - 0.1 K/uL  Urinalysis, Routine w reflex microscopic ([facility])  Result Value Ref Range   Color, Urine YELLOW YELLOW   APPearance CLEAR CLEAR   Specific Gravity, Urine 1.014 1.005 - 1.030   pH 6.0 5.0 - 8.0   Glucose, UA NEGATIVE NEGATIVE mg/dL   Hgb urine dipstick TRACE (A) NEGATIVE   Bilirubin Urine NEGATIVE NEGATIVE   Ketones, ur 15 (A) NEGATIVE mg/dL   Protein, ur >130>300 (A) NEGATIVE mg/dL   Nitrite NEGATIVE NEGATIVE   Leukocytes, UA NEGATIVE NEGATIVE  Urine microscopic-add on  Result Value Ref Range   Squamous Epithelial / LPF 0-5 (A) NONE SEEN   WBC, UA 6-30 0 - 5 WBC/hpf   RBC / HPF 6-30 0 - 5 RBC/hpf   Bacteria, UA MANY (A) NONE SEEN   Casts HYALINE CASTS (A) NEGATIVE  I-Stat CG4 Lactic Acid, ED  [facility])  Result Value Ref Range  Lactic Acid, Venous 2.91 (HH) 0.5 - 1.9 mmol/L   Comment NOTIFIED PHYSICIAN   I-Stat CG4 Lactic Acid, ED  (not at  [facility])  Result Value Ref Range   Lactic Acid, Venous 1.50 0.5 - 1.9 mmol/L  Type and screen  Result Value Ref Range   ABO/RH(D) O POS    Antibody Screen NEG    Sample Expiration 06/16/2016   ABO/Rh  Result  Value Ref Range   ABO/RH(D) O POS    Dg Chest Port 1 View  Result Date: 06/13/2016 CLINICAL DATA:  Initial evaluation for acute cough, fever, nausea and vomiting. EXAM: PORTABLE CHEST 1 VIEW COMPARISON:  Prior radiograph from 01/14/2016. FINDINGS: The cardiac and mediastinal silhouettes are stable in size and contour, and remain within normal limits. Lungs are normally inflated. Changes related to COPD present. Subtle patchy density at the left lung base, which may reflect atelectasis or possibly developing infiltrate. No other focal airspace disease. No pulmonary edema or pleural effusion. No pneumothorax. No acute osseous abnormality identified. Surgical clips overlie the lower neck. IMPRESSION: 1. Mild patchy left basilar opacity. While this finding could reflect atelectasis, possible mild/developing infiltrate could be considered. 2. COPD. Electronically Signed   By: Rise Mu M.D.   On: 06/13/2016 03:11   Mm Digital Screening Bilateral  Result Date: 06/02/2016 CLINICAL DATA:  Screening. EXAM: DIGITAL SCREENING BILATERAL MAMMOGRAM WITH CAD COMPARISON:  None. Patient worksheet indicates previous mammogram greater than 13 years ago and unavailable. ACR Breast Density Category c: The breast tissue is heterogeneously dense, which may obscure small masses. FINDINGS: In the right breast, calcifications warrant further evaluation with magnified views. In the left breast, no findings suspicious for malignancy. Images were processed with CAD. IMPRESSION: Further evaluation is suggested for calcifications in the right breast. RECOMMENDATION: Diagnostic mammogram of the right breast. (Code:[id number]) The patient will be contacted regarding the findings, and additional imaging will be scheduled. BI-RADS CATEGORY  0: Incomplete. Need additional imaging evaluation and/or prior mammograms for comparison. Electronically Signed   By: Bary Richard M.D.   On: 06/02/2016 15:21     MDM Reviewed: nursing  note, vitals and previous chart Reviewed previous: labs Interpretation: labs, x-ray and ECG (leukopenia ANC 700, uti. Early infiltrate by me on CXR ) Total time providing critical care: 75-105 minutes. This excludes time spent performing separately reportable procedures and services. Consults: admitting MD  CRITICAL CARE Performed by: Jasmine Awe Total critical care time: 90 minutes Critical care time was exclusive of separately billable procedures and treating other patients. Critical care was necessary to treat or prevent imminent or life-threatening deterioration. Critical care was time spent personally by me on the following activities: development of treatment plan with patient and/or surrogate as well as nursing, discussions with consultants, evaluation of patient's response to treatment, examination of patient, obtaining history from patient or surrogate, ordering and performing treatments and interventions, ordering and review of laboratory studies, ordering and review of radiographic studies, pulse oximetry and re-evaluation of patient's condition.;    EKG Interpretation  Date/Time:  Monday June 13 2016 02:06:02 EDT Ventricular Rate:  101 PR Interval:    QRS Duration: 84 QT Interval:  330 QTC Calculation: 428 R Axis:   3 Text Interpretation:  Sinus tachycardia Confirmed by [facility]  MD, Tesia Lybrand ([id number]) on 06/13/2016 3:02:07 AM       Final Clinical Impressions(s) / ED Diagnoses   Final diagnoses:  None   Medications  sodium chloride 0.9 % bolus 1,000 mL (0 mLs Intravenous Stopped  06/13/16 0257)    And  sodium chloride 0.9 % bolus 500 mL (0 mLs Intravenous Stopped 06/13/16 0258)  levofloxacin (LEVAQUIN) IVPB 750 mg (0 mg Intravenous Stopped 06/13/16 0334)  aztreonam (AZACTAM) 2 g in dextrose 5 % 50 mL IVPB (0 g Intravenous Stopped 06/13/16 0242)  vancomycin (VANCOCIN) IVPB 1000 mg/200 mL premix (0 mg Intravenous Stopped 06/13/16 0335)  ondansetron (ZOFRAN)  injection 4 mg (4 mg Intravenous Given 06/13/16 0206)   Multiple boluses   New Prescriptions New Prescriptions   No medications on file   I personally performed the services described in this documentation, which was scribed in my presence. The recorded information has been reviewed and is accurate.       Cy Blamer, MD 06/13/16 1610    Cy Blamer, MD 06/13/16 [phone]

## 2016-06-13 NOTE — Progress Notes (Signed)
PHARMACY - PHYSICIAN COMMUNICATION CRITICAL VALUE ALERT - BLOOD CULTURE IDENTIFICATION (BCID)  Results for orders placed or performed during the hospital encounter of 06/13/16  Blood Culture ID Panel (Reflexed) (Collected: 06/13/2016  1:28 AM)  Result Value Ref Range   Enterococcus species NOT DETECTED NOT DETECTED   Listeria monocytogenes NOT DETECTED NOT DETECTED   Staphylococcus species NOT DETECTED NOT DETECTED   Staphylococcus aureus NOT DETECTED NOT DETECTED   Streptococcus species NOT DETECTED NOT DETECTED   Streptococcus agalactiae NOT DETECTED NOT DETECTED   Streptococcus pneumoniae NOT DETECTED NOT DETECTED   Streptococcus pyogenes NOT DETECTED NOT DETECTED   Acinetobacter baumannii NOT DETECTED NOT DETECTED   Enterobacteriaceae species DETECTED (A) NOT DETECTED   Enterobacter cloacae complex NOT DETECTED NOT DETECTED   Escherichia coli DETECTED (A) NOT DETECTED   Klebsiella oxytoca NOT DETECTED NOT DETECTED   Klebsiella pneumoniae NOT DETECTED NOT DETECTED   Proteus species NOT DETECTED NOT DETECTED   Serratia marcescens NOT DETECTED NOT DETECTED   Carbapenem resistance NOT DETECTED NOT DETECTED   Haemophilus influenzae NOT DETECTED NOT DETECTED   Neisseria meningitidis NOT DETECTED NOT DETECTED   Pseudomonas aeruginosa NOT DETECTED NOT DETECTED   Candida albicans NOT DETECTED NOT DETECTED   Candida glabrata NOT DETECTED NOT DETECTED   Candida krusei NOT DETECTED NOT DETECTED   Candida parapsilosis NOT DETECTED NOT DETECTED   Candida tropicalis NOT DETECTED NOT DETECTED    Name of physician (or Provider) Contacted:  Dr. Arsenio LoaderSommer  Changes to prescribed antibiotics required:  None. Continue Imipenem and Vancomycin for now.  Dennie Fettersgan, Nehal Shives [place], [place] Pager: [phone] 06/13/2016  6:00 PM

## 2016-06-14 ENCOUNTER — Encounter (HOSPITAL_COMMUNITY): Payer: Self-pay | Admitting: Gastroenterology

## 2016-06-14 ENCOUNTER — Inpatient Hospital Stay (HOSPITAL_COMMUNITY): Payer: BC Managed Care – PPO

## 2016-06-14 ENCOUNTER — Encounter (HOSPITAL_COMMUNITY)
Admission: EM | Disposition: A | Discharge status: Home Health | Payer: Self-pay | Source: Home / Self Care | Attending: Internal Medicine

## 2016-06-14 DIAGNOSIS — R7881 Bacteremia: Secondary | ICD-10-CM

## 2016-06-14 DIAGNOSIS — D500 Iron deficiency anemia secondary to blood loss (chronic): Secondary | ICD-10-CM

## 2016-06-14 DIAGNOSIS — K922 Gastrointestinal hemorrhage, unspecified: Secondary | ICD-10-CM

## 2016-06-14 HISTORY — PX: ESOPHAGOGASTRODUODENOSCOPY: SHX5428

## 2016-06-14 LAB — TYPE AND SCREEN
ABO/RH(D): O POS
ANTIBODY SCREEN: NEGATIVE
UNIT DIVISION: 0
Unit division: 0

## 2016-06-14 LAB — CBC
HEMATOCRIT: 34.6 % — AB (ref 36.0–46.0)
Hemoglobin: 11.4 g/dL — ABNORMAL LOW (ref 12.0–15.0)
MCH: 30.6 pg (ref 26.0–34.0)
MCHC: 32.9 g/dL (ref 30.0–36.0)
MCV: 92.8 fL (ref 78.0–100.0)
PLATELETS: 178 10*3/uL (ref 150–400)
RBC: 3.73 MIL/uL — ABNORMAL LOW (ref 3.87–5.11)
RDW: 15.9 % — AB (ref 11.5–15.5)
WBC: 21.2 10*3/uL — AB (ref 4.0–10.5)

## 2016-06-14 LAB — BASIC METABOLIC PANEL
Anion gap: 9 (ref 5–15)
BUN: 19 mg/dL (ref 6–20)
CO2: 16 mmol/L — ABNORMAL LOW (ref 22–32)
CREATININE: 1.22 mg/dL — AB (ref 0.44–1.00)
Calcium: 8.4 mg/dL — ABNORMAL LOW (ref 8.9–10.3)
Chloride: 117 mmol/L — ABNORMAL HIGH (ref 101–111)
GFR calc Af Amer: 48 mL/min — ABNORMAL LOW (ref >60)
GFR, EST NON AFRICAN AMERICAN: 41 mL/min — AB (ref >60)
GLUCOSE: 100 mg/dL — AB (ref 65–99)
POTASSIUM: 4.5 mmol/L (ref 3.5–5.1)
SODIUM: 142 mmol/L (ref 135–145)

## 2016-06-14 LAB — GLUCOSE, CAPILLARY: GLUCOSE-CAPILLARY: 88 mg/dL (ref 65–99)

## 2016-06-14 LAB — TROPONIN I
TROPONIN I: 1.07 ng/mL — AB (ref <0.03)
Troponin I: 0.61 ng/mL (ref <0.03)
Troponin I: 0.75 ng/mL (ref <0.03)

## 2016-06-14 LAB — TSH: TSH: 0.433 u[IU]/mL (ref 0.350–4.500)

## 2016-06-14 LAB — PHOSPHORUS: PHOSPHORUS: 3.5 mg/dL (ref 2.5–4.6)

## 2016-06-14 LAB — STREP PNEUMONIAE URINARY ANTIGEN: STREP PNEUMO URINARY ANTIGEN: NEGATIVE

## 2016-06-14 LAB — CREATININE, URINE, RANDOM: CREATININE, URINE: 65.05 mg/dL

## 2016-06-14 LAB — HEMOGLOBIN AND HEMATOCRIT, BLOOD
HEMATOCRIT: 32.9 % — AB (ref 36.0–46.0)
HEMOGLOBIN: 10.9 g/dL — AB (ref 12.0–15.0)

## 2016-06-14 LAB — PROCALCITONIN: PROCALCITONIN: 42.2 ng/mL

## 2016-06-14 LAB — SODIUM, URINE, RANDOM: Sodium, Ur: 56 mmol/L

## 2016-06-14 LAB — MAGNESIUM: Magnesium: 1.9 mg/dL (ref 1.7–2.4)

## 2016-06-14 SURGERY — EGD (ESOPHAGOGASTRODUODENOSCOPY)
Anesthesia: Moderate Sedation

## 2016-06-14 MED ORDER — FENTANYL CITRATE (PF) 100 MCG/2ML IJ SOLN
INTRAMUSCULAR | Status: AC
  Filled 2016-06-14: qty 2

## 2016-06-14 MED ORDER — ACETAMINOPHEN 160 MG/5ML PO SOLN: 650.0000 mg | ORAL | Status: DC | PRN

## 2016-06-14 MED ORDER — FENTANYL CITRATE (PF) 100 MCG/2ML IJ SOLN
INTRAMUSCULAR | Status: DC | PRN
  Administered 2016-06-14 (×2): 25 ug via INTRAVENOUS

## 2016-06-14 MED ORDER — DEXTROSE-NACL 5-0.45 % IV SOLN
INTRAVENOUS | Status: DC
  Administered 2016-06-14: 11:00:00 via INTRAVENOUS

## 2016-06-14 MED ORDER — LORAZEPAM 0.5 MG PO TABS
0.5000 mg | ORAL_TABLET | Freq: Two times a day (BID) | ORAL | Status: DC | PRN
  Filled 2016-06-14: qty 1

## 2016-06-14 MED ORDER — METOPROLOL TARTRATE 25 MG PO TABS
25.0000 mg | ORAL_TABLET | Freq: Two times a day (BID) | ORAL | Status: DC
  Administered 2016-06-14 – 2016-06-21 (×15): 25 mg via ORAL
  Filled 2016-06-14 (×16): qty 1

## 2016-06-14 MED ORDER — FUROSEMIDE 10 MG/ML IJ SOLN
40.0000 mg | Freq: Once | INTRAMUSCULAR | Status: AC
  Administered 2016-06-14: 40 mg via INTRAVENOUS
  Filled 2016-06-14: qty 4

## 2016-06-14 MED ORDER — METOPROLOL TARTRATE 5 MG/5ML IV SOLN
5.0000 mg | Freq: Four times a day (QID) | INTRAVENOUS | Status: DC | PRN
  Administered 2016-06-14 – 2016-06-17 (×2): 5 mg via INTRAVENOUS
  Filled 2016-06-14 (×3): qty 5

## 2016-06-14 MED ORDER — MIDAZOLAM HCL 5 MG/ML IJ SOLN
INTRAMUSCULAR | Status: AC
  Filled 2016-06-14: qty 1

## 2016-06-14 MED ORDER — MIDAZOLAM HCL 10 MG/2ML IJ SOLN
INTRAMUSCULAR | Status: DC | PRN
  Administered 2016-06-14: 2 mg via INTRAVENOUS
  Administered 2016-06-14: 1 mg via INTRAVENOUS

## 2016-06-14 MED ORDER — ACETAMINOPHEN 500 MG PO TABS
1000.0000 mg | ORAL_TABLET | Freq: Four times a day (QID) | ORAL | Status: DC | PRN
  Administered 2016-06-15 – 2016-06-19 (×5): 1000 mg via ORAL
  Filled 2016-06-14 (×5): qty 2

## 2016-06-14 NOTE — Evaluation (Signed)
Physical Therapy Evaluation Patient Details Name: [name] MRN: [id number] DOB: 02-Jul-1936 Today's Date: 06/14/2016   History of Present Illness  Pt adm with UGI bleed. Received blood and underwent EGD.  PMH - HTN, ckd  Clinical Impression  Pt admitted with above diagnosis and presents to PT with functional limitations due to deficits listed below (See PT problem list). Pt needs skilled PT to maximize independence and safety to allow discharge to home with husband. Pt very independent and active prior to hospitalization and expect she will make good progress toward returning home.     Follow Up Recommendations No PT follow up;Supervision - Intermittent    Equipment Recommendations  None recommended by PT    Recommendations for Other Services       Precautions / Restrictions Precautions Precautions: Fall Restrictions Weight Bearing Restrictions: No      Mobility  Bed Mobility Overal bed mobility: Needs Assistance Bed Mobility: Supine to Sit     Supine to sit: Min assist     General bed mobility comments: Assist to elevate trunk into sitting  Transfers Overall transfer level: Needs assistance Equipment used: 1 person hand held assist Transfers: Sit to/from Stand Sit to Stand: Min assist         General transfer comment: Assist for balance  Ambulation/Gait Ambulation/Gait assistance: Min assist;+2 safety/equipment Ambulation Distance (Feet): 160 Feet Assistive device: 1 person hand held assist Gait Pattern/deviations: Step-through pattern;Decreased stride length     General Gait Details: Assist for balance  Stairs            Wheelchair Mobility    Modified Rankin (Stroke Patients Only)       Balance Overall balance assessment: Needs assistance Sitting-balance support: No upper extremity supported;Feet supported Sitting balance-Leahy Scale: Normal     Standing balance support: No upper extremity supported Standing balance-Leahy Scale:  Fair                               Pertinent Vitals/Pain Pain Assessment: No/denies pain    Home Living Family/patient expects to be discharged to:: Private residence Living Arrangements: Spouse/significant other Available Help at Discharge: Family;Available 24 hours/day Type of Home: House Home Access: Stairs to enter Entrance Stairs-Rails: Right Entrance Stairs-Number of Steps: 2-3 Home Layout: Two level;Laundry or work area in [employer]: None      Prior Function Level of Independence: Independent         Comments: Very active     Higher education careers adviser        Extremity/Trunk Assessment   Upper Extremity Assessment: Defer to OT evaluation           Lower Extremity Assessment: Generalized weakness         Communication   Communication: No difficulties  Cognition Arousal/Alertness: Awake/alert Behavior During Therapy: WFL for tasks assessed/performed Overall Cognitive Status: Within Functional Limits for tasks assessed                      General Comments      Exercises        Assessment/Plan    PT Assessment Patient needs continued PT services  PT Diagnosis Generalized weakness;Difficulty walking   PT Problem List Decreased strength;Decreased balance;Decreased mobility  PT Treatment Interventions DME instruction;Gait training;Functional mobility training;Therapeutic activities;Therapeutic exercise;Balance training;Patient/family education   PT Goals (Current goals can be found in the Care Plan section) Acute Rehab PT Goals Patient Stated Goal:  Return home PT Goal Formulation: With patient Time For Goal Achievement: 06/21/16 Potential to Achieve Goals: Good    Frequency Min 3X/week   Barriers to discharge        Co-evaluation               End of Session Equipment Utilized During Treatment: Gait belt Activity Tolerance: Patient tolerated treatment well Patient left: in chair;with call bell/phone  within reach;with chair alarm set;with family/visitor present Nurse Communication: Mobility status         Time: 1140-1200 PT Time Calculation (min) (ACUTE ONLY): 20 min   Charges:   PT Evaluation $PT Eval Low Complexity: 1 Procedure     PT G Codes:        Katy Brickell 06/14/2016, 1:42 PM [facility] Ni[facility] Behler PT [phone]

## 2016-06-14 NOTE — Op Note (Signed)
[facility] Patient Name: [name] Procedure Date : 06/14/2016 MRN: [id number] Attending MD: Jeani Hawking , MD Date of Birth: 03/28/1936 CSN: [id number] Age: 80 Admit Type: Inpatient Procedure:                Upper GI endoscopy Indications:              Coffee-ground emesis, Melena Providers:                Jeani Hawking, MD, Priscella Mann, RN, Arlee Muslim, Technician Referring MD:              Medicines:                Midazolam 4 mg IV, Fentanyl 50 micrograms IV Complications:            No immediate complications. Estimated Blood Loss:     Estimated blood loss: none. Procedure:                Pre-Anesthesia Assessment:                           - Prior to the procedure, a History and Physical                            was performed, and patient medications and                            allergies were reviewed. The patient's tolerance of                            previous anesthesia was also reviewed. The risks                            and benefits of the procedure and the sedation                            options and risks were discussed with the patient.                            All questions were answered, and informed consent                            was obtained. Prior Anticoagulants: The patient has                            taken no previous anticoagulant or antiplatelet                            agents. ASA Grade Assessment: III - A patient with                            severe systemic disease. After reviewing the risks  and benefits, the patient was deemed in                            satisfactory condition to undergo the procedure.                           After obtaining informed consent, the endoscope was                            passed under direct vision. Throughout the                            procedure, the patient's blood pressure, pulse, and   oxygen saturations were monitored continuously. The                            EG-[id number] ([id number] scope was introduced through the                            mouth, and advanced to the second part of duodenum.                            The upper GI endoscopy was accomplished without                            difficulty. The patient tolerated the procedure. Scope In: Scope Out: Findings:      The esophagus was normal.      1-2 non-bleeding superficial gastric ulcers with no stigmata of bleeding       were found in the gastric antrum. The largest lesion was 4 mm in largest       dimension. Biopsies were taken with a cold forceps for histology. Some       erosions and healing erosions/ulcerations were identified.      Two non-bleeding superficial duodenal ulcers with no stigmata of       bleeding were found in the duodenal bulb. The largest lesion was 4 mm in       largest dimension. Impression:               - Normal esophagus.                           - Non-bleeding gastric ulcers with no stigmata of                            bleeding. Biopsied.                           - Multiple non-bleeding duodenal ulcers with no                            stigmata of bleeding. Moderate Sedation:      Moderate (conscious) sedation was administered by the endoscopy nurse       and supervised by the endoscopist. The following parameters were       monitored: oxygen saturation, heart rate, blood pressure, and response  to care. Recommendation:           - Return patient to ICU for ongoing care.                           - Resume regular diet.                           - Continue present medications.                           - Await pathology results.                           - Avoid NSAIDs. Procedure Code(s):        --- Professional ---                           [phone], Esophagogastroduodenoscopy, flexible,                            transoral; with biopsy, single or multiple Diagnosis  Code(s):        --- Professional ---                           K25.9, Gastric ulcer, unspecified as acute or                            chronic, without hemorrhage or perforation                           K26.9, Duodenal ulcer, unspecified as acute or                            chronic, without hemorrhage or perforation                           K92.0, Hematemesis                           K92.1, Melena (includes Hematochezia) CPT copyright 2016 American Medical Association. All rights reserved. The codes documented in this report are preliminary and upon coder review may  be revised to meet current compliance requirements. Jeani Hawking, MD Jeani Hawking, MD 06/14/2016 2:09:27 PM This report has been signed electronically. Number of Addenda: 0

## 2016-06-14 NOTE — Progress Notes (Signed)
Pt arrived to 2W. Pt placed on tele, call light within reach. Plan of care discussed with pt and family (husband and daughter). Will continue to monitor.

## 2016-06-14 NOTE — Progress Notes (Signed)
Tele bed received for pt to transfer to 2W. Report called to GrenadaBrittany, Charity fundraiserN. Pt and family updated on bed placement. Pt transferred to 2W via wheelchair by RN and NT.   Roselie AwkwardMegan Jakyah Bradby, RN

## 2016-06-14 NOTE — Progress Notes (Signed)
PULMONARY / CRITICAL CARE MEDICINE   Name: [name] MRN: [id number] DOB: 07/09/1936    ADMISSION DATE:  06/13/2016 CONSULTATION DATE:  06/13/16  REFERRING MD:  Jarvis Newcomer - TR[facility]  CHIEF COMPLAINT:  Hypotension  HISTORY OF PRESENT ILLNESS:   [name] is a 80 y.o. female with PMH as outlined below.  She was admitted 9/11 with cough, fevers/chills, N/V/D, melena and coffee ground emesis. N/VD had started roughly 1 week earlier and had worsened over the last 1 - 2 days.  Melena had been going on "for a while" but coffee ground emesis began 9/11.   She was being prepared for emergent EGD AM of 9/11 but was found to be hypotensive with SBP in the 70's.  She was given IVF boluses and PCCM was called for assistance. Of note, she did receive metoprolol earlier that morning.  She does use naproxen and also admits to using goody powders.    SUBJECTIVE:  Resting comfortably, in NAD. EGD this am > (-) active bleeding. Has been HTNsive since this am.  On regular diet.   VITAL SIGNS: BP 130/90   Pulse 85   Temp 98.1 F (36.7 C) (Oral)   Resp 20   Ht 5' (1.524 m)   Wt 121 lb 4.8 oz (55 kg)   SpO2 94%   BMI 23.69 kg/m  3 liters  HEMODYNAMICS:        INTAKE / OUTPUT: I/O last 3 completed shifts: In: 5375 [I.V.:2700; Blood:1005; Other:170; IV Piggyback:1500] Out: 725 [Urine:725]   PHYSICAL EXAMINATION: General: Elderly female, in NAD. Neuro: A&O x 3, non-focal. Was confused right after EGD HEENT: [place]/AT. PERRL, sclerae anicteric.  MM dry. Cardiovascular: RRR, no M/R/G.  Lungs: Respirations even and unlabored. Faint basilar rales Abdomen: BS x 4, soft, NT/ND.  Musculoskeletal: No gross deformities, no edema.  Skin: Intact, warm, no rashes.  LABS:  BMET  Recent Labs Lab 06/13/16 0153 06/13/16 1054 06/13/16 1232 06/14/16 0521  NA 139 142 143 142  K 3.3* 4.0 4.7 4.5  CL 112* 115* 114* 117*  CO2 19* 18*  --  16*  BUN 21* 19 19 19   CREATININE 1.57* 1.46* 1.30* 1.22*   GLUCOSE 99 102* 91 100*    Electrolytes  Recent Labs Lab 06/13/16 0153 06/13/16 0557 06/13/16 1054 06/14/16 0521  CALCIUM 8.6*  --  8.0* 8.4*  MG  --  1.1*  --  1.9  PHOS  --   --   --  3.5    CBC  Recent Labs Lab 06/13/16 0557 06/13/16 1054  06/13/16 1501 06/14/16 0000 06/14/16 0521  WBC 8.5 8.5  --   --   --  21.2*  HGB 8.1* 7.8*  < > 6.9* 10.9* 11.4*  HCT 24.9* 23.6*  < > 21.1* 32.9* 34.6*  PLT 187 201  --   --   --  178  < > = values in this interval not displayed.  Coag's  Recent Labs Lab 06/13/16 0557  APTT 34  INR 1.20    Sepsis Markers  Recent Labs Lab 06/13/16 0424 06/13/16 0557 06/13/16 0600 06/13/16 1054 06/14/16 0521  LATICACIDVEN 1.50  --  1.3 1.6  --   PROCALCITON  --  30.85  --   --  42.20    ABG No results for input(s): PHART, PCO2ART, PO2ART in the last 168 hours.  Liver Enzymes  Recent Labs Lab 06/13/16 0153  AST 25  ALT 15  ALKPHOS 54  BILITOT 0.8  ALBUMIN 3.0*    Cardiac Enzymes  Recent Labs Lab 06/13/16 1501 06/14/16 0000 06/14/16 0521  TROPONINI 0.75* 0.61* 0.75*    Glucose No results for input(s): GLUCAP in the last 168 hours.  Imaging [place] Renal  Result Date: 06/14/2016 CLINICAL DATA:  Acute kidney injury superimposed on chronic kidney disease. EXAM: RENAL / URINARY TRACT ULTRASOUND COMPLETE COMPARISON:  CT abdomen/ pelvis 07/30/2015 FINDINGS: Right Kidney: Length: 10.1 cm. Borderline increased renal echogenicity. No mass or hydronephrosis visualized. Left Kidney: Length: 9.7 cm. Borderline increased renal echogenicity. No hydronephrosis visualized. Simple cyst in the lower right kidney measures 1.4 x 1.3 x 1.2 cm. Cyst in the upper pole measures 0.8 x 0.8 x 1.0 cm, suggestion of mild complexity likely due to small size. Bladder: Appears normal for degree of bladder distention. IMPRESSION: 1. No obstructive uropathy. Borderline increased renal echogenicity suggesting chronic medical renal disease. 2. Left  renal cysts. Electronically Signed   By: Rubye OaksMelanie  Ehinger M.D.   On: 06/14/2016 02:51   Dg Chest Port 1 View  Result Date: 06/14/2016 CLINICAL DATA:  Respiratory failure, shortness of breath, sepsis and community-acquired pneumonia peer EXAM: PORTABLE CHEST 1 VIEW COMPARISON:  Portable chest x-ray of June 13, 2016 FINDINGS: The lungs are well-expanded. There is persistent bibasilar atelectasis or pneumonia. Small bilateral pleural effusions are present. The heart is top-normal in size but stable. The central pulmonary vascularity is prominent. The right internal jugular venous catheter tip projects over the midportion of the SVC. IMPRESSION: Slight interval worsening of bibasilar atelectasis or pneumonia. Stable small bilateral pleural effusions. Mild pulmonary vascular congestion slightly more conspicuous today. Electronically Signed   By: David  [place]azil[place] M.D.   On: 06/14/2016 07:22   Dg Chest Port 1 View  Result Date: 06/13/2016 CLINICAL DATA:  Central line placement EXAM: PORTABLE CHEST 1 VIEW COMPARISON:  Chest radiograph from earlier today. FINDINGS: Right internal jugular central venous catheter terminates at the cavoatrial junction. Stable cardiomediastinal silhouette with mild cardiomegaly. No pneumothorax. Trace bilateral pleural effusions, probably stable. Borderline mild pulmonary edema, slightly worsened. Patchy mild left lung base opacity appears stable. IMPRESSION: 1. Right internal jugular central venous catheter terminates at the cavoatrial junction. No pneumothorax. 2. Borderline mild congestive heart failure, slightly worsened. 3. Probable stable trace bilateral pleural effusions. 4. Stable patchy left lung base opacity, favor atelectasis. Electronically Signed   By: Delbert PhenixJason A Poff M.D.   On: 06/13/2016 16:51     STUDIES:  CXR 9/11 > mild left basilar opacity. Renal US 9/11 >1. No obstructive uropathy. Borderline increased renal echogenicity suggesting chronic medical renal  disease. 2. Left renal cysts EGD 9/12 >gastritis d/t NSAIDs  CULTURES: Blood 9/11: ecoli>>> Urine 9/11 > RVP 9/11: negative   ANTIBIOTICS: Vanc 9/11 >9/12 Imipenem 9/11 >  SIGNIFICANT EVENTS: 9/11 > admitted with presumed UGIB and shock (combo of sepsis of unclear etiology as well as hypovolemic) > initially planned for emergent EGD > aborted due to hypotension > transferred to ICU. 9/12 BC + Ecoli. Hemodynamically stable. EGD completed: NSAID related gastritis. ABX narrowed.   LINES/TUBES: None.  DISCUSSION: 80 y.o. female admitted 9/11 with presumed UGIB as well as shock w/ GNR (ECOLI) bacteremia. EGD showed findings c/w NSAID (9/12) related gastritis. I suspect that the bacteremia was d/t bacterial translocation from gut vs UT source. Will dc vanc, cont imipenem until sensitivities are available. She does have some delirium s/p EGD from meds but this is rapidly clearing. For today: adv diet, cont PPI, dc vanc dc NSAIDs, & cont  primaxin. Will give lasix x 1 for what is likely ALI and follow CXR, Will order ECHO to assess for wall motion abnormality although suspect that trop bump is simply r/t sepsis. She will move to tele this afternoon if stays stable.   ASSESSMENT / PLAN:  CARDIOVASCULAR A:  Hemorrhagic and septic shock-->resolved.  Hx HTN.  Mild troponin bump in setting of sepsis P:  Cont to hold antihypertensives  Goal MAP > 65. Echo for wall motion abnormality. IF wall motion abnormality would consider cards eval.  Cont tele  INFECTIOUS A:   GNR bacteremia -->ecoli almost certainly from gut translocation: wbc rising (likely from steroids) PCT rising but should level out and start to drop soon.  P:   Cont imipenem->narrow after sensitivities return  Cont PCT algo  Dc vanc  GASTROINTESTINAL A:   UGIB d/t gastritis  GI prophylaxis. Nutrition. P:   Regular diet PPI. No NSAIDS  HEMATOLOGIC A:   Acute blood loss anemia - from UGIB (NSAID related  gastritis) VTE Prophylaxis. P:  Goal hgb > 7 Cont daily cbc SCD's only. Dc asa NO NSAIDS F/u post-surgical path  RENAL A:   AKI-->improved Left renal cyst-->no hydro.  NAG metabolic acidosis in setting of hyperchloremia from volume resuscitation  P:   Change IVF to d51/2 at 50 Allow oral free water  F/u am chemistry    PULMONARY A: Acute hypoxic respiratory failure. CAP vs ALI -->favor ALI in setting of bacteremia  P:   Continue supplemental O2 as needed to maintain SpO2 > 92% Lasix x 1 then repeat am CXR  ENDOCRINE A:   Hx hypothyroidism. Adrenal insuff P:   tsh ok; not on rx  Stress dose steroids-->d/c 9/13 if remains stable  NEUROLOGIC A:   Hx anxiety. Acute delirium-->d/t sedating meds for EGD P:   Will recover in ICU Cont supportive care  Frequent re-orientation   Family updated:  By babcock  Interdisciplinary Family Meeting v Palliative Care Meeting:  Due by: 9/18.  30 minutes ccm time  Simonne Martinet ACNP-BC [facility] Pulmonary/Critical Care Pager # [phone] OR # [phone] if no answer   06/14/2016 9:09 AM  ATTENDING NOTE / ATTESTATION NOTE :   I have discussed the case with the resident/APP Anders Simmonds.    I agree with the resident/APP's  history, physical examination, assessment, and plans.    I have edited the above note and modified it according to our agreed history, physical examination, assessment and plan.   Briefly, pt admitted for GI bleed and was hypotensive during EGD. Transferred to ICU.  Eventually treated as septic shock 2/2 Ecoli bacteremia.  Rpt EGD on 9/12 with no active bleeding.  Actually, she has been HTNsive since this am.  Hb and Hct stable. With mild troponin bump. Will cont IV abx. Start diet. Add metoprolol today. Transfer to telemetry.    Family :Family updated at length today.    Pollie Meyer, MD 06/14/2016, 11:58 AM [facility] Pager [phone] After 3 pm or if no answer, call  [phone]

## 2016-06-14 NOTE — H&P (View-Only) (Signed)
Reason for Consult: Coffee-ground emesis and melena Referring Physician: [facility]  [name] HPI: This is a 80 year old female with a PMH of CKD, HTN, and anxiety admitted for complaints of melena and coffee-ground emesis.  She reports that she started to experience melena "for a while", but then she started with coffee-ground emesis today as well as fever and rigors.  Upon presentation her WBC was noted to be 1.0, but then it increased up to 8.  She typically has an SBP in the 200 range, but her blood pressure was around 109 mmHg and she received metoprolol.  Currently her blood pressure is in the 70's/30's and an HR in the 70's.  The intention was to perform an emergent EGD, but her BP precludes any intervention at this time.  She admits to using NSAIDs on a routine basis for her body aches and she typically uses naprosyn.  No complaints about chest pain or SOB.  Past Medical History:  Diagnosis Date  . Blood transfusion without reported diagnosis   . CKD (chronic kidney disease), stage III   . Hypertension   . PONV (postoperative nausea and vomiting)    nausea  . Thyroid disease     Past Surgical History:  Procedure Laterality Date  . ABDOMINAL HYSTERECTOMY    . CHOLECYSTECTOMY N/A 08/13/2015   Procedure: LAPAROSCOPIC CHOLECYSTECTOMY WITH INTRAOPERATIVE CHOLANGIOGRAM;  Surgeon: Avel Peace, MD;  Location: [facility] OR;  Service: General;  Laterality: N/A;  . THYROIDECTOMY, PARTIAL      Family History  Problem Relation Age of Onset  . Stroke Mother     Social History:  reports that she has never smoked. She has never used smokeless tobacco. She reports that she does not drink alcohol or use drugs.  Allergies:  Allergies  Allergen Reactions  . Amoxicillin Hives and Itching    Has patient had a PCN reaction causing immediate rash, facial/tongue/throat swelling, SOB or lightheadedness with hypotension: Yes Has patient had a PCN reaction causing severe rash involving mucus  membranes or skin necrosis: Yes Has patient had a PCN reaction that required hospitalization No Has patient had a PCN reaction occurring within the last 10 years: Yes If all of the above answers are "NO", then may proceed with Cephalosporin use.     Medications:  Scheduled: . [MAR Hold] famotidine (PEPCID) IV  20 mg Intravenous Q12H  . [MAR Hold] levofloxacin (LEVAQUIN) IV  500 mg Intravenous Q48H  . [MAR Hold] omega-3 acid ethyl esters  1 g Oral Daily  . sodium chloride  1,000 mL Intravenous Once   Continuous: . sodium chloride 100 mL/hr at 06/13/16 0617  . phenylephrine (NEO-SYNEPHRINE) Adult infusion 20 mcg/min (06/13/16 1227)    Results for orders placed or performed during the hospital encounter of 06/13/16 (from the past 24 hour(s))  Comprehensive metabolic panel     Status: Abnormal   Collection Time: 06/13/16  1:53 AM  Result Value Ref Range   Sodium 139 135 - 145 mmol/L   Potassium 3.3 (L) 3.5 - 5.1 mmol/L   Chloride 112 (H) 101 - 111 mmol/L   CO2 19 (L) 22 - 32 mmol/L   Glucose, Bld 99 65 - 99 mg/dL   BUN 21 (H) 6 - 20 mg/dL   Creatinine, Ser 1.61 (H) 0.44 - 1.00 mg/dL   Calcium 8.6 (L) 8.9 - 10.3 mg/dL   Total Protein 5.2 (L) 6.5 - 8.1 g/dL   Albumin 3.0 (L) 3.5 - 5.0 g/dL   AST  25 15 - 41 U/L   ALT 15 14 - 54 U/L   Alkaline Phosphatase 54 38 - 126 U/L   Total Bilirubin 0.8 0.3 - 1.2 mg/dL   GFR calc non Af Amer 30 (L) >60 mL/min   GFR calc Af Amer 35 (L) >60 mL/min   Anion gap 8 5 - 15  CBC WITH DIFFERENTIAL     Status: Abnormal   Collection Time: 06/13/16  1:53 AM  Result Value Ref Range   WBC 1.0 (LL) 4.0 - 10.5 K/uL   RBC 2.66 (L) 3.87 - 5.11 MIL/uL   Hemoglobin 8.2 (L) 12.0 - 15.0 g/dL   HCT 09.6 (L) 04.5 - 40.9 %   MCV 95.5 78.0 - 100.0 fL   MCH 30.8 26.0 - 34.0 pg   MCHC 32.3 30.0 - 36.0 g/dL   RDW 81.1 91.4 - 78.2 %   Platelets 209 150 - 400 K/uL   Neutrophils Relative % 73 %   Neutro Abs 0.7 (L) 1.7 - 7.7 K/uL   Lymphocytes Relative 21 %    Lymphs Abs 0.2 (L) 0.7 - 4.0 K/uL   Monocytes Relative 1 %   Monocytes Absolute 0.0 (L) 0.1 - 1.0 K/uL   Eosinophils Relative 4 %   Eosinophils Absolute 0.0 0.0 - 0.7 K/uL   Basophils Relative 2 %   Basophils Absolute 0.0 0.0 - 0.1 K/uL  Urinalysis, Routine w reflex microscopic (not at [facility])     Status: Abnormal   Collection Time: 06/13/16  1:55 AM  Result Value Ref Range   Color, Urine YELLOW YELLOW   APPearance CLEAR CLEAR   Specific Gravity, Urine 1.014 1.005 - 1.030   pH 6.0 5.0 - 8.0   Glucose, UA NEGATIVE NEGATIVE mg/dL   Hgb urine dipstick TRACE (A) NEGATIVE   Bilirubin Urine NEGATIVE NEGATIVE   Ketones, ur 15 (A) NEGATIVE mg/dL   Protein, ur >956 (A) NEGATIVE mg/dL   Nitrite NEGATIVE NEGATIVE   Leukocytes, UA NEGATIVE NEGATIVE  Urine microscopic-add on     Status: Abnormal   Collection Time: 06/13/16  1:55 AM  Result Value Ref Range   Squamous Epithelial / LPF 0-5 (A) NONE SEEN   WBC, UA 6-30 0 - 5 WBC/hpf   RBC / HPF 6-30 0 - 5 RBC/hpf   Bacteria, UA MANY (A) NONE SEEN   Casts HYALINE CASTS (A) NEGATIVE  I-Stat CG4 Lactic Acid, ED  (not at  [facility])     Status: Abnormal   Collection Time: 06/13/16  1:56 AM  Result Value Ref Range   Lactic Acid, Venous 2.91 (HH) 0.5 - 1.9 mmol/L   Comment NOTIFIED PHYSICIAN   Type and screen     Status: None   Collection Time: 06/13/16  2:09 AM  Result Value Ref Range   ABO/RH(D) O POS    Antibody Screen NEG    Sample Expiration 06/16/2016   ABO/Rh     Status: None   Collection Time: 06/13/16  2:09 AM  Result Value Ref Range   ABO/RH(D) O POS   I-Stat CG4 Lactic Acid, ED  (not at  [facility])     Status: None   Collection Time: 06/13/16  4:24 AM  Result Value Ref Range   Lactic Acid, Venous 1.50 0.5 - 1.9 mmol/L  Magnesium     Status: Abnormal   Collection Time: 06/13/16  5:57 AM  Result Value Ref Range   Magnesium 1.1 (L) 1.7 - 2.4 mg/dL  Procalcitonin  Status: None   Collection Time: 06/13/16  5:57 AM  Result Value Ref Range    Procalcitonin 30.85 ng/mL  Protime-INR     Status: None   Collection Time: 06/13/16  5:57 AM  Result Value Ref Range   Prothrombin Time 15.2 11.4 - 15.2 seconds   INR 1.20   APTT     Status: None   Collection Time: 06/13/16  5:57 AM  Result Value Ref Range   aPTT 34 24 - 36 seconds  CBC     Status: Abnormal   Collection Time: 06/13/16  5:57 AM  Result Value Ref Range   WBC 8.5 4.0 - 10.5 K/uL   RBC 2.61 (L) 3.87 - 5.11 MIL/uL   Hemoglobin 8.1 (L) 12.0 - 15.0 g/dL   HCT 40.924.9 (L) 81.136.0 - 91.446.0 %   MCV 95.4 78.0 - 100.0 fL   MCH 31.0 26.0 - 34.0 pg   MCHC 32.5 30.0 - 36.0 g/dL   RDW 78.214.6 95.611.5 - 21.315.5 %   Platelets 187 150 - 400 K/uL  Lactic acid, plasma     Status: None   Collection Time: 06/13/16  6:00 AM  Result Value Ref Range   Lactic Acid, Venous 1.3 0.5 - 1.9 mmol/L  Lactic acid, plasma     Status: None   Collection Time: 06/13/16 10:54 AM  Result Value Ref Range   Lactic Acid, Venous 1.6 0.5 - 1.9 mmol/L  CBC     Status: Abnormal   Collection Time: 06/13/16 10:54 AM  Result Value Ref Range   WBC 8.5 4.0 - 10.5 K/uL   RBC 2.46 (L) 3.87 - 5.11 MIL/uL   Hemoglobin 7.8 (L) 12.0 - 15.0 g/dL   HCT 08.623.6 (L) 57.836.0 - 46.946.0 %   MCV 95.9 78.0 - 100.0 fL   MCH 31.7 26.0 - 34.0 pg   MCHC 33.1 30.0 - 36.0 g/dL   RDW 62.914.9 52.811.5 - 41.315.5 %   Platelets 201 150 - 400 K/uL     Dg Chest Port 1 View  Result Date: 06/13/2016 CLINICAL DATA:  Initial evaluation for acute cough, fever, nausea and vomiting. EXAM: PORTABLE CHEST 1 VIEW COMPARISON:  Prior radiograph from 01/14/2016. FINDINGS: The cardiac and mediastinal silhouettes are stable in size and contour, and remain within normal limits. Lungs are normally inflated. Changes related to COPD present. Subtle patchy density at the left lung base, which may reflect atelectasis or possibly developing infiltrate. No other focal airspace disease. No pulmonary edema or pleural effusion. No pneumothorax. No acute osseous abnormality identified.  Surgical clips overlie the lower neck. IMPRESSION: 1. Mild patchy left basilar opacity. While this finding could reflect atelectasis, possible mild/developing infiltrate could be considered. 2. COPD. Electronically Signed   By: Rise MuBenjamin  McClintock M.D.   On: 06/13/2016 03:11    ROS:  As stated above in the HPI otherwise negative.  Blood pressure (!) 79/30, pulse 73, temperature 97.5 F (36.4 C), temperature source Oral, resp. rate 17, height 5\' 1"  (1.549 m), weight 51.8 kg (114 lb 3.2 oz), SpO2 99 %.    PE: Gen: NAD, Alert and Oriented, weak and fatigued appearing HEENT:  [place]/AT, EOMI Neck: Supple, no LAD Lungs: CTA Bilaterally CV: RRR without M/G/R ABM: Soft, NTND, +BS Ext: No C/C/E  Assessment/Plan: 1) Coffee-ground emesis. 2) Melena. 3) Hypotension.   Her blood loss and the possibility of sepsis resulted in a relative hypotension, but it was compounded with the use of metoprolol.  From the GI standpoint she appears  to be stable, i.e., no hematemesis or hematochezia, but an emergent EGD can be pursued if the clinical situation warrants.  I am anticipating that her BP will increase when the effects of metoprolol wear off.  She will require the EGD at some point and hopefully it will not be an emergent procedure.  Current HGB is at 6.8 g/dL, which is a 1 gram drop from the last value.  She also received a 1 liter bolus of fluids for her hypotension.  Plan: 1) PPI drip. 2) Follow CBC. 3) Transfuse as necessary. 4) Antibiotics per CCM. 5) EGD, at latest, tomorrow.  Troyce Febo D 06/13/2016, 12:21 PM

## 2016-06-14 NOTE — Interval H&P Note (Signed)
History and Physical Interval Note:  06/14/2016 8:02 AM  Amanda HeldAnn V Brahmbhatt  has presented today for surgery, with the diagnosis of bleeding  The various methods of treatment have been discussed with the patient and family. After consideration of risks, benefits and other options for treatment, the patient has consented to  Procedure(s): ESOPHAGOGASTRODUODENOSCOPY (EGD) (N/A) as a surgical intervention .  The patient's history has been reviewed, patient examined, no change in status, stable for surgery.  I have reviewed the patient's chart and labs.  Questions were answered to the patient's satisfaction.     Torren Maffeo D

## 2016-06-15 ENCOUNTER — Inpatient Hospital Stay (HOSPITAL_COMMUNITY): Payer: BC Managed Care – PPO

## 2016-06-15 DIAGNOSIS — R0600 Dyspnea, unspecified: Secondary | ICD-10-CM

## 2016-06-15 LAB — ECHOCARDIOGRAM COMPLETE
EERAT: 18.23
EWDT: 151 ms
FS: 24 % — AB (ref 28–44)
HEIGHTINCHES: 60 in
IVS/LV PW RATIO, ED: 0.98
LA ID, A-P, ES: 33 mm
LA diam index: 2.15 cm/m2
LA vol A4C: 47.9 ml
LA vol index: 34.4 mL/m2
LA vol: 52.9 mL
LEFT ATRIUM END SYS DIAM: 33 mm
LV TDI E'LATERAL: 6.31
LV TDI E'MEDIAL: 6.64
LV e' LATERAL: 6.31 cm/s
LVEEAVG: 18.23
LVEEMED: 18.23
LVOT area: 2.27 cm2
LVOTD: 17 mm
MV Dec: 151
MV pk E vel: 115 m/s
MVPG: 5 mmHg
MVPKAVEL: 121 m/s
PW: 9.31 mm — AB (ref 0.6–1.1)
RV TAPSE: 20.3 mm
Reg peak vel: 284 cm/s
TR max vel: 284 cm/s
WEIGHTICAEL: 1940.8 [oz_av]

## 2016-06-15 LAB — CULTURE, BLOOD (ROUTINE X 2)

## 2016-06-15 LAB — COMPREHENSIVE METABOLIC PANEL
ALBUMIN: 2.4 g/dL — AB (ref 3.5–5.0)
ALK PHOS: 90 U/L (ref 38–126)
ALT: 95 U/L — AB (ref 14–54)
AST: 49 U/L — AB (ref 15–41)
Anion gap: 7 (ref 5–15)
BILIRUBIN TOTAL: 0.6 mg/dL (ref 0.3–1.2)
BUN: 28 mg/dL — AB (ref 6–20)
CO2: 17 mmol/L — ABNORMAL LOW (ref 22–32)
CREATININE: 1.13 mg/dL — AB (ref 0.44–1.00)
Calcium: 8.8 mg/dL — ABNORMAL LOW (ref 8.9–10.3)
Chloride: 117 mmol/L — ABNORMAL HIGH (ref 101–111)
GFR calc Af Amer: 53 mL/min — ABNORMAL LOW (ref >60)
GFR, EST NON AFRICAN AMERICAN: 45 mL/min — AB (ref >60)
GLUCOSE: 154 mg/dL — AB (ref 65–99)
POTASSIUM: 3.4 mmol/L — AB (ref 3.5–5.1)
Sodium: 141 mmol/L (ref 135–145)
TOTAL PROTEIN: 5 g/dL — AB (ref 6.5–8.1)

## 2016-06-15 LAB — CBC
HEMATOCRIT: 35 % — AB (ref 36.0–46.0)
HEMOGLOBIN: 11.6 g/dL — AB (ref 12.0–15.0)
MCH: 30.1 pg (ref 26.0–34.0)
MCHC: 33.1 g/dL (ref 30.0–36.0)
MCV: 90.7 fL (ref 78.0–100.0)
Platelets: 211 10*3/uL (ref 150–400)
RBC: 3.86 MIL/uL — AB (ref 3.87–5.11)
RDW: 15.9 % — AB (ref 11.5–15.5)
WBC: 19.5 10*3/uL — AB (ref 4.0–10.5)

## 2016-06-15 LAB — LEGIONELLA PNEUMOPHILA SEROGP 1 UR AG: L. pneumophila Serogp 1 Ur Ag: NEGATIVE

## 2016-06-15 LAB — URINE CULTURE: Culture: >=100000 — AB

## 2016-06-15 LAB — TROPONIN I
TROPONIN I: 0.4 ng/mL — AB (ref <0.03)
Troponin I: 0.66 ng/mL (ref <0.03)
Troponin I: 0.53 ng/mL (ref <0.03)

## 2016-06-15 LAB — PROCALCITONIN: PROCALCITONIN: 28.79 ng/mL

## 2016-06-15 LAB — MAGNESIUM: Magnesium: 1.7 mg/dL (ref 1.7–2.4)

## 2016-06-15 MED ORDER — POTASSIUM CHLORIDE CRYS ER 20 MEQ PO TBCR
40.0000 meq | EXTENDED_RELEASE_TABLET | Freq: Once | ORAL | Status: AC
  Administered 2016-06-15: 40 meq via ORAL
  Filled 2016-06-15: qty 2

## 2016-06-15 MED ORDER — SODIUM BICARBONATE 650 MG PO TABS
650.0000 mg | ORAL_TABLET | Freq: Two times a day (BID) | ORAL | Status: DC
  Administered 2016-06-15 – 2016-06-16 (×4): 650 mg via ORAL
  Filled 2016-06-15 (×4): qty 1

## 2016-06-15 MED ORDER — FUROSEMIDE 10 MG/ML IJ SOLN
20.0000 mg | Freq: Once | INTRAMUSCULAR | Status: AC
  Administered 2016-06-15: 20 mg via INTRAVENOUS
  Filled 2016-06-15: qty 2

## 2016-06-15 MED ORDER — HYDROCORTISONE NA SUCCINATE PF 100 MG IJ SOLR
50.0000 mg | Freq: Two times a day (BID) | INTRAMUSCULAR | Status: DC
  Administered 2016-06-15 – 2016-06-16 (×2): 50 mg via INTRAVENOUS
  Filled 2016-06-15 (×2): qty 2

## 2016-06-15 MED ORDER — LORAZEPAM 2 MG/ML IJ SOLN
1.0000 mg | Freq: Once | INTRAMUSCULAR | Status: AC
  Administered 2016-06-15: 1 mg via INTRAVENOUS
  Filled 2016-06-15: qty 1

## 2016-06-15 NOTE — Progress Notes (Signed)
PROGRESS NOTE    HERO MCCATHERN  ZOX:[id number] DOB: 1936/09/02 DOA: 06/13/2016 PCP: Pearson Grippe, MD    Brief Narrative: HISTORY OF PRESENT ILLNESS:   80 y.o. female admitted 9/11 with presumed UGIB as well as shock w/ GNR (ECOLI) bacteremia. EGD showed findings c/w NSAID (9/12) related gastritis. I suspect that the bacteremia was d/t bacterial translocation from gut vs UT source. Will dc vanc, cont imipenem until sensitivities are available. She does have some delirium s/p EGD from meds but this is rapidly clearing. For today: adv diet, cont PPI, dc vanc dc NSAIDs, & cont primaxin. Will give lasix x 1 for what is likely ALI and follow CXR, Will order ECHO to assess for wall motion abnormality although suspect that trop bump is simply r/t sepsis. She will move to tele this afternoon if stays stable.   Assessment & Plan:   Principal Problem:   Sepsis (HCC) Active Problems:   Acute renal failure superimposed on stage 3 chronic kidney disease (HCC)   Essential hypertension   CAP (community acquired pneumonia)   Nausea vomiting and diarrhea   Neutropenia (HCC)   Anxiety   UGIB (upper gastrointestinal bleed)   Hypokalemia   Leukopenia   AKI (acute kidney injury) (HCC)   Dehydration   Encounter for central line placement   Difficult intravenous access   E coli bacteremia   Iron deficiency anemia due to chronic blood loss  DISCUSSION: 80 y.o. female admitted 9/11 with presumed UGIB as well as shock w/ GNR (ECOLI) bacteremia. EGD showed findings c/w NSAID (9/12) related gastritis. I suspect that the bacteremia was d/t bacterial translocation from gut vs UT source. Will dc vanc, cont imipenem until sensitivities are available. She does have some delirium s/p EGD from meds but this is rapidly clearing. For today: adv diet, cont PPI, dc vanc dc NSAIDs, & cont primaxin. Will give lasix x 1 for what is likely ALI and follow CXR, Will order ECHO to assess for wall motion abnormality although suspect  that trop bump is simply r/t sepsis. She will move to tele this afternoon if stays stable.    Hemorrhagic and septic shock-->resolved.  Hx HTN.  Mild troponin bump in setting of sepsis, trending down ECHO with Normal EF Goal MAP > 65.  GNR bacteremia -->ecoli almost certainly from gut translocation: wbc rising (likely from steroids) PCT rising but should level out and start to drop soon.  Cont imipenem->narrow after sensitivities return  Cont PCT algo  Dc vanc.  Acute delirium-->d/t sedating meds for EGD Cont supportive care  Frequent re-orientation  check CT head.  Avoid IV benzos.   Acute blood loss anemia - from UGIB (NSAID related gastritis) VTE Prophylaxis. Goal hgb > 7 Cont daily cbc SCD's only. Dc asa NO NSAIDS F/u post-surgical path continue with protonix.    AKI-->improved Left renal cyst-->no hydro.  NAG metabolic acidosis in setting of hyperchloremia from volume resuscitation  Allow oral free water  F/u am chemistry  Hold IV fluids due to pleural effusion.  Start oral bicarb.   Acute hypoxic respiratory failure. CAP vs ALI -->favor ALI in setting of bacteremia  Continue supplemental O2 as needed to maintain SpO2 > 92% Chest x ray with pleural effusion. Will give another dose of lasix, IV   Hx hypothyroidism. Adrenal insuff tsh ok; not on rx  Stress dose steroids-->continue taper.     DVT prophylaxis: scd Code Status: full code.  Family Communication: family updated twice  Disposition Plan: not stable to  be discharge  SIGNIFICANT EVENTS: 9/11 > admitted with presumed UGIB and shock (combo of sepsis of unclear etiology as well as hypovolemic) > initially planned for emergent EGD > aborted due to hypotension > transferred to ICU. 9/12 BC + Ecoli. Hemodynamically stable. EGD completed: NSAID related gastritis. ABX narrowed.   Consultants:   Ccm    Procedures:  ECHO; Left ventricle: The cavity size was normal. Wall thickness was   normal.  Systolic function was normal. The estimated ejection   fraction was in the range of 60% to 65%. Wall motion was normal;   there were no regional wall motion abnormalities. Doppler   parameters are consistent with abnormal left ventricular   relaxation (grade 1 diastolic dysfunction). - Mitral valve: There was mild regurgitation. - Pulmonary arteries: Systolic pressure was mildly increased. PA   peak pressure: 35 mm Hg (S). - Pericardium, extracardiac: There was a left pleural effusion.     Antimicrobials: Primaxin    Subjective: Patient is alert, feeling weak, events from yesterday notice. Patient was agitated, and confused yesterday. She is oriented to place , person this morning.  Not eating well.   Objective: Vitals:   06/14/16 2000 06/14/16 2100 06/14/16 2300 06/15/16 0547  BP: (!) 160/70 (!) 184/94 (!) 142/78 140/87  Pulse: 75 80  82  Resp: (!) 24 (!) 21  20  Temp:  97.7 F (36.5 C)  97.7 F (36.5 C)  TempSrc:    Oral  SpO2: 95% 99%  92%  Weight:      Height:        Intake/Output Summary (Last 24 hours) at 06/15/16 1005 Last data filed at 06/14/16 1900  Gross per 24 hour  Intake           743.33 ml  Output                0 ml  Net           743.33 ml   Filed Weights   06/13/16 0709 06/13/16 1247 06/14/16 0501  Weight: 51.8 kg (114 lb 3.2 oz) 52.1 kg (114 lb 13.8 oz) 55 kg (121 lb 4.8 oz)    Examination:  General exam: Appears calm and comfortable  Respiratory system: Clear to auscultation. Respiratory effort normal. Cardiovascular system: S1 & S2 heard, RRR. No JVD, murmurs, rubs, gallops or clicks. No pedal edema. Gastrointestinal system: Abdomen is nondistended, soft and nontender. No organomegaly or masses felt. Normal bowel sounds heard. Central nervous system: Alert and oriented. No focal neurological deficits. Extremities: Symmetric 5 x 5 power. Skin: No rashes, lesions or ulcers Psychiatry: Judgement and insight appear normal. Mood & affect  appropriate.     Data Reviewed: I have personally reviewed following labs and imaging studies  CBC:  Recent Labs Lab 06/13/16 0153 06/13/16 0557 06/13/16 1054 06/13/16 1232 06/13/16 1501 06/14/16 0000 06/14/16 0521 06/15/16 0747  WBC 1.0* 8.5 8.5  --   --   --  21.2* 19.5*  NEUTROABS 0.7*  --   --   --   --   --   --   --   HGB 8.2* 8.1* 7.8* 6.8* 6.9* 10.9* 11.4* 11.6*  HCT 25.4* 24.9* 23.6* 20.0* 21.1* 32.9* 34.6* 35.0*  MCV 95.5 95.4 95.9  --   --   --  92.8 90.7  PLT 209 187 201  --   --   --  178 211   Basic Metabolic Panel:  Recent Labs Lab 06/13/16 0153 06/13/16 0557 06/13/16  1054 06/13/16 1232 06/14/16 0521 06/15/16 0747  NA 139  --  142 143 142 141  K 3.3*  --  4.0 4.7 4.5 3.4*  CL 112*  --  115* 114* 117* 117*  CO2 19*  --  18*  --  16* 17*  GLUCOSE 99  --  102* 91 100* 154*  BUN 21*  --  19 19 19  28*  CREATININE 1.57*  --  1.46* 1.30* 1.22* 1.13*  CALCIUM 8.6*  --  8.0*  --  8.4* 8.8*  MG  --  1.1*  --   --  1.9 1.7  PHOS  --   --   --   --  3.5  --    GFR: Estimated Creatinine Clearance: 31.9 mL/min (by C-G formula based on SCr of 1.13 mg/dL (H)). Liver Function Tests:  Recent Labs Lab 06/13/16 0153 06/15/16 0747  AST 25 49*  ALT 15 95*  ALKPHOS 54 90  BILITOT 0.8 0.6  PROT 5.2* 5.0*  ALBUMIN 3.0* 2.4*   No results for input(s): LIPASE, AMYLASE in the last 168 hours. No results for input(s): AMMONIA in the last 168 hours. Coagulation Profile:  Recent Labs Lab 06/13/16 0557  INR 1.20   Cardiac Enzymes:  Recent Labs Lab 06/13/16 1501 06/14/16 0000 06/14/16 0521 06/14/16 1315  TROPONINI 0.75* 0.61* 0.75* 1.07*   BNP (last 3 results) No results for input(s): PROBNP in the last 8760 hours. HbA1C: No results for input(s): HGBA1C in the last 72 hours. CBG:  Recent Labs Lab 06/14/16 0838  GLUCAP 88   Lipid Profile: No results for input(s): CHOL, HDL, LDLCALC, TRIG, CHOLHDL, LDLDIRECT in the last 72 hours. Thyroid  Function Tests:  Recent Labs  06/14/16 0000  TSH 0.433   Anemia Panel: No results for input(s): VITAMINB12, FOLATE, FERRITIN, TIBC, IRON, RETICCTPCT in the last 72 hours. Sepsis Labs:  Recent Labs Lab 06/13/16 0156 06/13/16 0424 06/13/16 0557 06/13/16 0600 06/13/16 1054 06/14/16 0521 06/15/16 0747  PROCALCITON  --   --  30.85  --   --  42.20 28.79  LATICACIDVEN 2.91* 1.50  --  1.3 1.6  --   --     Recent Results (from the past 240 hour(s))  Blood Culture (routine x 2)     Status: Abnormal (Preliminary result)   Collection Time: 06/13/16  1:28 AM  Result Value Ref Range Status   Specimen Description BLOOD LEFT ARM  Final   Special Requests BOTTLES DRAWN AEROBIC AND ANAEROBIC  Final   Culture  Setup Time   Final    GRAM NEGATIVE RODS IN BOTH AEROBIC AND ANAEROBIC BOTTLES CRITICAL RESULT CALLED TO, READ BACK BY AND VERIFIED WITH: T.EGAN, PHARMD AT1720 ON 06/13/16 BY C. JESSUP, MLT.    Culture (A)  Final    ESCHERICHIA COLI SUSCEPTIBILITIES PERFORMED ON PREVIOUS CULTURE WITHIN THE LAST 5 DAYS.    Report Status PENDING  Incomplete   Organism ID, Bacteria ESCHERICHIA COLI  Final      Susceptibility   Escherichia coli - MIC*    AMPICILLIN <=2 SENSITIVE Sensitive     CEFAZOLIN <=4 SENSITIVE Sensitive     CEFEPIME <=1 SENSITIVE Sensitive     CEFTAZIDIME <=1 SENSITIVE Sensitive     CEFTRIAXONE <=1 SENSITIVE Sensitive     CIPROFLOXACIN <=0.25 SENSITIVE Sensitive     GENTAMICIN <=1 SENSITIVE Sensitive     IMIPENEM <=0.25 SENSITIVE Sensitive     TRIMETH/SULFA <=20 SENSITIVE Sensitive     AMPICILLIN/SULBACTAM <=2 SENSITIVE  Sensitive     PIP/TAZO <=4 SENSITIVE Sensitive     Extended ESBL NEGATIVE Sensitive     * ESCHERICHIA COLI  Blood Culture ID Panel (Reflexed)     Status: Abnormal   Collection Time: 06/13/16  1:28 AM  Result Value Ref Range Status   Enterococcus species NOT DETECTED NOT DETECTED Final   Listeria monocytogenes NOT DETECTED NOT DETECTED Final    Staphylococcus species NOT DETECTED NOT DETECTED Final   Staphylococcus aureus NOT DETECTED NOT DETECTED Final   Streptococcus species NOT DETECTED NOT DETECTED Final   Streptococcus agalactiae NOT DETECTED NOT DETECTED Final   Streptococcus pneumoniae NOT DETECTED NOT DETECTED Final   Streptococcus pyogenes NOT DETECTED NOT DETECTED Final   Acinetobacter baumannii NOT DETECTED NOT DETECTED Final   Enterobacteriaceae species DETECTED (A) NOT DETECTED Final    Comment: CRITICAL RESULT CALLED TO, READ BACK BY AND VERIFIED WITH: T. EGAN, PHARMD AT 1720 ON 06/13/16 BY C. JESSUP, MLT.    Enterobacter cloacae complex NOT DETECTED NOT DETECTED Final   Escherichia coli DETECTED (A) NOT DETECTED Final    Comment: CRITICAL RESULT CALLED TO, READ BACK BY AND VERIFIED WITH: T. EGAN, PHARMD AT 1720 ON 06/13/16 BY C. JESSUP, MLT.    Klebsiella oxytoca NOT DETECTED NOT DETECTED Final   Klebsiella pneumoniae NOT DETECTED NOT DETECTED Final   Proteus species NOT DETECTED NOT DETECTED Final   Serratia marcescens NOT DETECTED NOT DETECTED Final   Carbapenem resistance NOT DETECTED NOT DETECTED Final   Haemophilus influenzae NOT DETECTED NOT DETECTED Final   Neisseria meningitidis NOT DETECTED NOT DETECTED Final   Pseudomonas aeruginosa NOT DETECTED NOT DETECTED Final   Candida albicans NOT DETECTED NOT DETECTED Final   Candida glabrata NOT DETECTED NOT DETECTED Final   Candida krusei NOT DETECTED NOT DETECTED Final   Candida parapsilosis NOT DETECTED NOT DETECTED Final   Candida tropicalis NOT DETECTED NOT DETECTED Final  Urine culture     Status: Abnormal   Collection Time: 06/13/16  1:55 AM  Result Value Ref Range Status   Specimen Description URINE, RANDOM  Final   Special Requests NONE  Final   Culture >=100,000 COLONIES/mL ESCHERICHIA COLI (A)  Final   Report Status 06/15/2016 FINAL  Final   Organism ID, Bacteria ESCHERICHIA COLI (A)  Final      Susceptibility   Escherichia coli - MIC*     AMPICILLIN <=2 SENSITIVE Sensitive     CEFAZOLIN <=4 SENSITIVE Sensitive     CEFTRIAXONE <=1 SENSITIVE Sensitive     CIPROFLOXACIN <=0.25 SENSITIVE Sensitive     GENTAMICIN <=1 SENSITIVE Sensitive     IMIPENEM <=0.25 SENSITIVE Sensitive     NITROFURANTOIN <=16 SENSITIVE Sensitive     TRIMETH/SULFA <=20 SENSITIVE Sensitive     AMPICILLIN/SULBACTAM <=2 SENSITIVE Sensitive     PIP/TAZO <=4 SENSITIVE Sensitive     Extended ESBL NEGATIVE Sensitive     * >=100,000 COLONIES/mL ESCHERICHIA COLI  Blood Culture (routine x 2)     Status: Abnormal   Collection Time: 06/13/16  2:15 AM  Result Value Ref Range Status   Specimen Description BLOOD LEFT WRIST  Final   Special Requests IN PEDIATRIC BOTTLE  Final   Culture  Setup Time   Final    GRAM NEGATIVE RODS AEROBIC BOTTLE ONLY CRITICAL RESULT CALLED TO, READ BACK BY AND VERIFIED WITH: T. EGAN, PHARMD AT 1720 ON 06/13/16 BY C. JESSUP, MLT.    Culture (A)  Final    ESCHERICHIA COLI  SUSCEPTIBILITIES PERFORMED ON PREVIOUS CULTURE WITHIN THE LAST 5 DAYS.    Report Status 06/15/2016 FINAL  Final  Respiratory Panel by PCR     Status: None   Collection Time: 06/13/16  9:19 AM  Result Value Ref Range Status   Adenovirus NOT DETECTED NOT DETECTED Final   Coronavirus 229E NOT DETECTED NOT DETECTED Final   Coronavirus HKU1 NOT DETECTED NOT DETECTED Final   Coronavirus NL63 NOT DETECTED NOT DETECTED Final   Coronavirus OC43 NOT DETECTED NOT DETECTED Final   Metapneumovirus NOT DETECTED NOT DETECTED Final   Rhinovirus / Enterovirus NOT DETECTED NOT DETECTED Final   Influenza A NOT DETECTED NOT DETECTED Final   Influenza B NOT DETECTED NOT DETECTED Final   Parainfluenza Virus 1 NOT DETECTED NOT DETECTED Final   Parainfluenza Virus 2 NOT DETECTED NOT DETECTED Final   Parainfluenza Virus 3 NOT DETECTED NOT DETECTED Final   Parainfluenza Virus 4 NOT DETECTED NOT DETECTED Final   Respiratory Syncytial Virus NOT DETECTED NOT DETECTED Final   Bordetella  pertussis NOT DETECTED NOT DETECTED Final   Chlamydophila pneumoniae NOT DETECTED NOT DETECTED Final   Mycoplasma pneumoniae NOT DETECTED NOT DETECTED Final         Radiology Studies: [facility]  Result Date: 06/14/2016 CLINICAL DATA:  Acute kidney injury superimposed on chronic kidney disease. EXAM: RENAL / URINARY TRACT ULTRASOUND COMPLETE COMPARISON:  CT abdomen/ pelvis 07/30/2015 FINDINGS: Right Kidney: Length: 10.1 cm. Borderline increased renal echogenicity. No mass or hydronephrosis visualized. Left Kidney: Length: 9.7 cm. Borderline increased renal echogenicity. No hydronephrosis visualized. Simple cyst in the lower right kidney measures 1.4 x 1.3 x 1.2 cm. Cyst in the upper pole measures 0.8 x 0.8 x 1.0 cm, suggestion of mild complexity likely due to small size. Bladder: Appears normal for degree of bladder distention. IMPRESSION: 1. No obstructive uropathy. Borderline increased renal echogenicity suggesting chronic medical renal disease. 2. Left renal cysts. Electronically Signed   By: Rubye OaksMelanie  Ehinger M.D.   On: 06/14/2016 02:51   Dg Chest Port 1 View  Result Date: 06/15/2016 CLINICAL DATA:  ARDS, weakness, hypertension EXAM: PORTABLE CHEST 1 VIEW COMPARISON:  Portable exam 0636 hours compared to 06/14/2006 D FINDINGS: RIGHT jugular central venous catheter with tip projecting over SVC. Rotation to LEFT. Enlargement of cardiac silhouette with pulmonary vascular congestion. RIGHT pleural effusion present. Atelectasis versus consolidation in BILATERAL lower lobes. No pneumothorax. Bones demineralized. IMPRESSION: Persistent bibasilar atelectasis versus consolidation with increased RIGHT pleural effusion. Enlargement of cardiac silhouette. Electronically Signed   By: Ulyses SouthwardMark  Boles M.D.   On: 06/15/2016 08:03   Dg Chest Port 1 View  Result Date: 06/14/2016 CLINICAL DATA:  Respiratory failure, shortness of breath, sepsis and community-acquired pneumonia peer EXAM: PORTABLE CHEST 1 VIEW  COMPARISON:  Portable chest x-ray of June 13, 2016 FINDINGS: The lungs are well-expanded. There is persistent bibasilar atelectasis or pneumonia. Small bilateral pleural effusions are present. The heart is top-normal in size but stable. The central pulmonary vascularity is prominent. The right internal jugular venous catheter tip projects over the midportion of the SVC. IMPRESSION: Slight interval worsening of bibasilar atelectasis or pneumonia. Stable small bilateral pleural effusions. Mild pulmonary vascular congestion slightly more conspicuous today. Electronically Signed   By: David  [place]azil[place] M.D.   On: 06/14/2016 07:22   Dg Chest Port 1 View  Result Date: 06/13/2016 CLINICAL DATA:  Central line placement EXAM: PORTABLE CHEST 1 VIEW COMPARISON:  Chest radiograph from earlier today. FINDINGS: Right internal jugular central venous catheter terminates  at the cavoatrial junction. Stable cardiomediastinal silhouette with mild cardiomegaly. No pneumothorax. Trace bilateral pleural effusions, probably stable. Borderline mild pulmonary edema, slightly worsened. Patchy mild left lung base opacity appears stable. IMPRESSION: 1. Right internal jugular central venous catheter terminates at the cavoatrial junction. No pneumothorax. 2. Borderline mild congestive heart failure, slightly worsened. 3. Probable stable trace bilateral pleural effusions. 4. Stable patchy left lung base opacity, favor atelectasis. Electronically Signed   By: Delbert Phenix M.D.   On: 06/13/2016 16:51        Scheduled Meds: . hydrocortisone sod succinate (SOLU-CORTEF) inj  50 mg Intravenous Q6H  . imipenem-cilastatin  500 mg Intravenous Q12H  . metoprolol tartrate  25 mg Oral BID  . omega-3 acid ethyl esters  1 g Oral Daily  . [START ON 06/17/2016] pantoprazole  40 mg Intravenous Q12H  . sodium chloride  2,000 mL Intravenous Once   Continuous Infusions: . dextrose 5 % and 0.45% NaCl 50 mL/hr at 06/14/16 1038  . pantoprozole  (PROTONIX) infusion 8 mg/hr (06/14/16 1041)     LOS: 2 days    Time spent:35 minutes.     Alba Cory, MD [facility] Pager [phone]  If 7PM-7AM, please contact night-coverage [email or web address] Password [email or web address] 06/15/2016, 10:05 AM

## 2016-06-15 NOTE — Evaluation (Signed)
Occupational Therapy Evaluation Patient Details Name: [name] DATE MRN: [id number] DOB: 1935-11-13 Today's Date: 06/15/2016    History of Present Illness Pt adm with UGI bleed. Received blood and underwent EGD.  PMH - HTN, ckd   Clinical Impression   Pt currently demonstrates independence to modified independence for basic selfcare and functional transfers.  No LOB noted with any functional tasks during session for with mobility in the room and short distance in the hallway.  No further OT needs at this time or DME needs.     Follow Up Recommendations  No OT follow up    Equipment Recommendations  None recommended by OT    Recommendations for Other Services       Precautions / Restrictions Precautions Precautions: Fall Restrictions Weight Bearing Restrictions: No      Mobility Bed Mobility Overal bed mobility: Needs Assistance                Transfers Overall transfer level: Independent   Transfers: Sit to/from Stand Sit to Stand: Independent              Balance Overall balance assessment: Independent Sitting-balance support: No upper extremity supported;Feet supported Sitting balance-Leahy Scale: Normal       Standing balance-Leahy Scale: Good                              ADL Overall ADL's : At baseline                                       General ADL Comments: Pt demonstrates independence for simulated selfcare tasks as well as functional transfers to the toilet and shower.      Vision Vision Assessment?: No apparent visual deficits   Perception Perception Perception Tested?: No   Praxis Praxis Praxis tested?: Within functional limits    Pertinent Vitals/Pain Pain Assessment: 0-10 Pain Score: 3  Pain Location: neck pain Pain Intervention(s): Monitored during session     Hand Dominance Right   Extremity/Trunk Assessment Upper Extremity Assessment Upper Extremity Assessment: Overall WFL for tasks  assessed   Lower Extremity Assessment Lower Extremity Assessment: Defer to PT evaluation   Cervical / Trunk Assessment Cervical / Trunk Assessment: Normal   Communication Communication Communication: No difficulties   Cognition Arousal/Alertness: Awake/alert Behavior During Therapy: WFL for tasks assessed/performed Overall Cognitive Status: Within Functional Limits for tasks assessed                                Home Living Family/patient expects to be discharged to:: Private residence Living Arrangements: Spouse/significant other Available Help at Discharge: Family;Available 24 hours/day Type of Home: House Home Access: Stairs to enter [employer] of Steps: 2-3 Entrance Stairs-Rails: Right Home Layout: Two level;Laundry or work area in basement     [employer] Shower/Tub: Tub/shower unit [employer] characteristics: Engineer, building services: Pharmacist, community: Yes   Home Equipment: Bedside commode          Prior Functioning/Environment Level of Independence: Independent        Comments: Very active                              End of Session Nurse Communication: Mobility status  Activity Tolerance: Patient  tolerated treatment well Patient left: in bed;with call bell/phone within reach;with family/visitor present   Time: [id number] OT Time Calculation (min): 20 min Charges:  OT General Charges $OT Visit: 1 Procedure OT Evaluation $OT Eval Low Complexity: 1 Procedure  Pier Bosher OTR/L 06/15/2016, 2:52 PM

## 2016-06-15 NOTE — Progress Notes (Signed)
Patient's central line was removed as per MD orders with no complications

## 2016-06-15 NOTE — Progress Notes (Signed)
Patient and family were given education regarding incentive spirometer and its use.

## 2016-06-15 NOTE — Progress Notes (Signed)
Pt agitated and confused. Pt disoriented to situation and place. Will continue to monitor.

## 2016-06-15 NOTE — Progress Notes (Signed)
  Echocardiogram 2D Echocardiogram has been performed.  [name], [name] 06/15/2016, 9:08 AM

## 2016-06-15 NOTE — Progress Notes (Signed)
PT Cancellation Note  Patient Details Name: [name] MRN: [id number] DOB: 03-18-1936   Cancelled Treatment:    Reason Eval/Treat Not Completed: Patient declined, no reason specified, daughter states that pt has not slept. Pt declined ambulation as well as getting up to chair. Will check back later today as time permits.    Advait Buice, [place] 06/15/2016, 9:55 AM

## 2016-06-15 NOTE — Progress Notes (Signed)
Physical Therapy Treatment Patient Details Name: [name] V Blok MRN: [id number] DOB: 22-Feb-1936 Today's Date: 06/15/2016    History of Present Illness Pt adm with UGI bleed. Received blood and underwent EGD.  PMH - HTN, ckd    PT Comments    Pt with very different mental status than evaluation yesterday, very fatigued with flat affect, pt herself reports, "I just don't care about anything", minimal verbalization throughout session. Did not feel up to walking this morning. Was able to walk in hallway with OT short distance but then declined with PT and mobilized only within room. Strength equal bilaterally and no new balance deficits. PT will continue to follow.   Follow Up Recommendations  Home health PT;Supervision - Intermittent     Equipment Recommendations  None recommended by PT    Recommendations for Other Services       Precautions / Restrictions Precautions Precautions: Fall Restrictions Weight Bearing Restrictions: No    Mobility  Bed Mobility Overal bed mobility: Needs Assistance Bed Mobility: Supine to Sit     Supine to sit: Min guard     General bed mobility comments: pt able to come slowly into sitting with use of rail  Transfers Overall transfer level: Needs assistance Equipment used: None Transfers: Sit to/from Stand Sit to Stand: Supervision         General transfer comment: pt stands slowly with trunk flexion, supervision for safety  Ambulation/Gait Ambulation/Gait assistance: Min assist Ambulation Distance (Feet): 10 Feet Assistive device: 1 person hand held assist Gait Pattern/deviations: Step-through pattern;Decreased stride length;Trunk flexed Gait velocity: decreased Gait velocity interpretation: Below normal speed for age/gender General Gait Details: pt had just ambulated in hallway with OT and was too fatigued to go again, ambulated within room to chair with min A. Slow, small steps   Stairs            Wheelchair Mobility     Modified Rankin (Stroke Patients Only)       Balance Overall balance assessment: Needs assistance Sitting-balance support: Feet supported;No upper extremity supported Sitting balance-Leahy Scale: Normal     Standing balance support: No upper extremity supported Standing balance-Leahy Scale: Good Standing balance comment: pt able to maintain static balance in standing without UE support but when she began to perform dynamic exercises in standing, needed single limb UE support                    Cognition Arousal/Alertness: Awake/alert Behavior During Therapy: Flat affect Overall Cognitive Status: Impaired/Different from baseline Area of Impairment: Following commands     Memory: Decreased short-term memory Following Commands: Follows one step commands with increased time;Follows multi-step commands with increased time       General Comments: pt very flat (though slightly better than this morning), decreased verbalization, pt states that she just doesn't care about anything and that that is not like her    Exercises General Exercises - Lower Extremity Ankle Circles/Pumps: AROM;Both;20 reps;Seated Quad Sets: AROM;Both;10 reps;Seated Long Arc Quad: AROM;Both;10 reps;Seated Hip Flexion/Marching: AROM;Both;10 reps;Standing Heel Raises: AROM;Both;10 reps;Standing    General Comments General comments (skin integrity, edema, etc.): VSS      Pertinent Vitals/Pain Pain Assessment: No/denies pain Pain Score: 3  Pain Location: neck pain Pain Intervention(s): Monitored during session    Home Living Family/patient expects to be discharged to:: Private residence Living Arrangements: Spouse/significant other Available Help at Discharge: Family;Available 24 hours/day Type of Home: House Home Access: Stairs to enter Entrance Stairs-Rails: Right Home Layout: Two  level;Laundry or work area in [employer]: Bedside commode      Prior Function Level of  Independence: Independent      Comments: Very active   PT Goals (current goals can now be found in the care plan section) Acute Rehab PT Goals Patient Stated Goal: Return home PT Goal Formulation: With patient Time For Goal Achievement: 06/21/16 Potential to Achieve Goals: Good Progress towards PT goals: Not progressing toward goals - comment (fatigue, change in mental status)    Frequency  Min 3X/week    PT Plan Discharge plan needs to be updated    Co-evaluation             End of Session Equipment Utilized During Treatment: Gait belt Activity Tolerance: Patient limited by fatigue Patient left: in chair;with call bell/phone within reach;with family/visitor present     Time: 1027-2536 PT Time Calculation (min) (ACUTE ONLY): 20 min  Charges:  $Therapeutic Exercise: 8-22 mins                    G Codes:     Lyanne Co, PT  Acute Rehab Services  [phone]  Lyanne Co 06/15/2016, 3:51 PM

## 2016-06-16 ENCOUNTER — Encounter (HOSPITAL_COMMUNITY): Payer: Self-pay | Admitting: General Practice

## 2016-06-16 ENCOUNTER — Inpatient Hospital Stay (HOSPITAL_COMMUNITY): Payer: BC Managed Care – PPO

## 2016-06-16 DIAGNOSIS — I63331 Cerebral infarction due to thrombosis of right posterior cerebral artery: Secondary | ICD-10-CM

## 2016-06-16 LAB — BASIC METABOLIC PANEL
ANION GAP: 7 (ref 5–15)
BUN: 21 mg/dL — ABNORMAL HIGH (ref 6–20)
CO2: 27 mmol/L (ref 22–32)
Calcium: 9.1 mg/dL (ref 8.9–10.3)
Chloride: 111 mmol/L (ref 101–111)
Creatinine, Ser: 1.09 mg/dL — ABNORMAL HIGH (ref 0.44–1.00)
GFR calc non Af Amer: 47 mL/min — ABNORMAL LOW (ref >60)
GFR, EST AFRICAN AMERICAN: 55 mL/min — AB (ref >60)
Glucose, Bld: 131 mg/dL — ABNORMAL HIGH (ref 65–99)
Potassium: 3.8 mmol/L (ref 3.5–5.1)
Sodium: 145 mmol/L (ref 135–145)

## 2016-06-16 LAB — CBC WITH DIFFERENTIAL/PLATELET
BASOS ABS: 0 10*3/uL (ref 0.0–0.1)
BASOS PCT: 0 %
Eosinophils Absolute: 0 10*3/uL (ref 0.0–0.7)
Eosinophils Relative: 0 %
HEMATOCRIT: 37.5 % (ref 36.0–46.0)
HEMOGLOBIN: 12.5 g/dL (ref 12.0–15.0)
Lymphocytes Relative: 6 %
Lymphs Abs: 1 10*3/uL (ref 0.7–4.0)
MCH: 30.9 pg (ref 26.0–34.0)
MCHC: 33.3 g/dL (ref 30.0–36.0)
MCV: 92.6 fL (ref 78.0–100.0)
MONOS PCT: 4 %
Monocytes Absolute: 0.6 10*3/uL (ref 0.1–1.0)
NEUTROS ABS: 16.4 10*3/uL — AB (ref 1.7–7.7)
NEUTROS PCT: 90 %
Platelets: 265 10*3/uL (ref 150–400)
RBC: 4.05 MIL/uL (ref 3.87–5.11)
RDW: 15.5 % (ref 11.5–15.5)
WBC: 18.1 10*3/uL — ABNORMAL HIGH (ref 4.0–10.5)

## 2016-06-16 LAB — CULTURE, BLOOD (ROUTINE X 2)

## 2016-06-16 MED ORDER — POTASSIUM CHLORIDE CRYS ER 20 MEQ PO TBCR
40.0000 meq | EXTENDED_RELEASE_TABLET | Freq: Once | ORAL | Status: AC
  Administered 2016-06-16: 40 meq via ORAL
  Filled 2016-06-16: qty 2

## 2016-06-16 MED ORDER — SODIUM CHLORIDE 0.9 % IV SOLN
500.0000 mg | Freq: Three times a day (TID) | INTRAVENOUS | Status: DC
  Administered 2016-06-16 – 2016-06-17 (×3): 500 mg via INTRAVENOUS
  Filled 2016-06-16 (×5): qty 500

## 2016-06-16 MED ORDER — FUROSEMIDE 10 MG/ML IJ SOLN
40.0000 mg | Freq: Once | INTRAMUSCULAR | Status: AC
  Administered 2016-06-16: 40 mg via INTRAVENOUS
  Filled 2016-06-16: qty 4

## 2016-06-16 MED ORDER — AMLODIPINE BESYLATE 5 MG PO TABS
2.5000 mg | ORAL_TABLET | Freq: Every day | ORAL | Status: DC
  Administered 2016-06-16 – 2016-06-17 (×2): 2.5 mg via ORAL
  Filled 2016-06-16 (×3): qty 1

## 2016-06-16 MED ORDER — PANTOPRAZOLE SODIUM 40 MG IV SOLR
40.0000 mg | Freq: Two times a day (BID) | INTRAVENOUS | Status: DC
  Administered 2016-06-16 – 2016-06-20 (×9): 40 mg via INTRAVENOUS
  Filled 2016-06-16 (×8): qty 40

## 2016-06-16 NOTE — Progress Notes (Signed)
Pharmacy Antibiotic Note  [name] is a 80 y.o. female admitted on 06/13/2016 with GI bleed and shock .  Pharmacy has been consulted for Imipenem dosing.   Day #4 Imipenem for E coli bacteremia, noted likely from gut translocation. Pansensitive, but continuing on Imipenem for now. Discussed briefly with Dr. Sunnie Nielsenegalado.  Afebrile,WBC 18.1, steroids tapering to off this am. Right pleural effusion on CXR 9/13.   Creatinine has trended down, and currently at borderline for Q12 vs q8h Primaxin dosing.  Plan:  Adjust Primaxin 500 mg IV from q12h to q8h to maximize therapy.  Will follow renal function, progress, and antibiotic plans.  Could consider de-escalating to Ceftriaxone 2gm IV q24hrs.  Hx Amoxcillin allergy, but tolerating.Imipenem. Has had Cefazolin one-time in the past.  Height: 5' (152.4 cm) Weight: 113 lb 3.2 oz (51.3 kg) IBW/kg (Calculated) : 45.5  Temp (24hrs), Avg:98.2 F (36.8 C), Min:97.8 F (36.6 C), Max:98.7 F (37.1 C)   Recent Labs Lab 06/13/16 0156 06/13/16 0424 06/13/16 0557 06/13/16 0600 06/13/16 1054 06/13/16 1232 06/14/16 0521 06/15/16 0747 06/16/16 0437  WBC  --   --  8.5  --  8.5  --  21.2* 19.5* 18.1*  CREATININE  --   --   --   --  1.46* 1.30* 1.22* 1.13* 1.09*  LATICACIDVEN 2.91* 1.50  --  1.3 1.6  --   --   --   --     Estimated Creatinine Clearance: 30.6 mL/min (by C-G formula based on SCr of 1.09 mg/dL (H)).    Allergies  Allergen Reactions  . Amoxicillin Hives and Itching    Has patient had a PCN reaction causing immediate rash, facial/tongue/throat swelling, SOB or lightheadedness with hypotension: Yes Has patient had a PCN reaction causing severe rash involving mucus membranes or skin necrosis: Yes Has patient had a PCN reaction that required hospitalization No Has patient had a PCN reaction occurring within the last 10 years: Yes If all of the above answers are "NO", then may proceed with Cephalosporin use.     Antimicrobials this  admission: Vanc 9/11 >>9/12 Primaxin 9/11 >> Aztreonam x 1 on 9/11 Levaquin x 1 on 9/11  Dose adjustments this admission:  9/14 - adjust Imipenem q12h to q8h for improved renal function  Microbiology results: 9/11 UCx: > 100K/ml E coli - pansensitive 9/11 BCx: 2 of 2 E coli - pansensitive 9/11 sputum cx: cancelled 9/11 RSV panel: neg 9/12 urine strep pneumo: neg  Thank you for allowing pharmacy to be a part of this patient's care.  Dennie Fettersgan, Edwinna Rochette [place], [place] Pager: [phone] 06/16/2016 9:44 AM

## 2016-06-16 NOTE — Progress Notes (Signed)
PROGRESS NOTE    [name]  ZOX:[id number] DOB: August 24, 1936 DOA: 06/13/2016 PCP: Pearson Grippe, MD    Brief Narrative: HISTORY OF PRESENT ILLNESS:   80 y.o. female admitted 9/11 with presumed UGIB as well as shock w/ GNR (ECOLI) bacteremia. EGD showed findings c/w NSAID (9/12) related gastritis. I suspect that the bacteremia was d/t bacterial translocation from gut vs UT source. Will dc vanc, cont imipenem until sensitivities are available. She does have some delirium s/p EGD from meds but this is rapidly clearing. For today: adv diet, cont PPI, dc vanc dc NSAIDs, & cont primaxin. Will give lasix x 1 for what is likely ALI and follow CXR, Will order ECHO to assess for wall motion abnormality although suspect that trop bump is simply r/t sepsis. She will move to tele this afternoon if stays stable.   Assessment & Plan:   Principal Problem:   Sepsis (HCC) Active Problems:   Acute renal failure superimposed on stage 3 chronic kidney disease (HCC)   Essential hypertension   CAP (community acquired pneumonia)   Nausea vomiting and diarrhea   Neutropenia (HCC)   Anxiety   UGIB (upper gastrointestinal bleed)   Hypokalemia   Leukopenia   AKI (acute kidney injury) (HCC)   Dehydration   Encounter for central line placement   Difficult intravenous access   E coli bacteremia   Iron deficiency anemia due to chronic blood loss  DISCUSSION: 80 y.o. female admitted 9/11 with presumed UGIB as well as shock w/ GNR (ECOLI) bacteremia. EGD showed findings c/w NSAID (9/12) related gastritis. I suspect that the bacteremia was d/t bacterial translocation from gut vs UT source. Will dc vanc, cont imipenem until sensitivities are available. She does have some delirium s/p EGD from meds but this is rapidly clearing. For today: adv diet, cont PPI, dc vanc dc NSAIDs, & cont primaxin. Will give lasix x 1 for what is likely ALI and follow CXR, Will order ECHO to assess for wall motion abnormality although suspect  that trop bump is simply r/t sepsis. She will move to tele this afternoon if stays stable.    Hemorrhagic and septic shock-->resolved.  Hx HTN.  Mild troponin bump in setting of sepsis, trending down, ECHO with Normal EF Goal MAP > 65.  E coli.  bacteremia -->ecoli almost certainly from gut translocation: wbc rising (likely from steroids) PCT rising but should level out and start to drop soon.  Cont imipenem->narrow after repeat chest x ray and improvement of effusion  Or PNA Cont PCT algo  Dc vanc.  PNA, pleural effusion;  IV lasix today. Repeat chest x ray 9-15.  If effusion persist will need thoracentesis.  Would continue with Primaxin to cover for Health care associated PNA , WBC significantly  Elevated at 18.   Acute delirium-->d/t sedating meds for EGD Cont supportive care  Frequent re-orientation  Avoid IV benzos.   Acute stroke;  MRI with acute-subacute stroke, occlusion of Right PCA.  Neurology consulted.   HTN resume lower dose Norvasc.   Acute blood loss anemia - from UGIB (NSAID related gastritis) Endoscopy: Normal esophagus.- Non-bleeding gastric ulcers with no stigmata of bleeding. Biopsied. - Multiple non-bleeding duodenal ulcers with no stigmata of bleeding. Cont daily cbc SCD's only. Dc asa NO NSAIDS F/u post-surgical path continue with protonix.    AKI-->improved Left renal cyst-->no hydro.  NAG metabolic acidosis in setting of hyperchloremia from volume resuscitation  Hold IV fluids due to pleural effusion.  Started  oral bicarb.  Improved.   Acute hypoxic respiratory failure. CAP vs ALI --> ALI in setting of bacteremia  Continue supplemental O2 as needed to maintain SpO2 > 92% received IV lasix 9-13. Will repeat dose today.  Weight 55--51.  Repeat chest x ray 9-15.   Hx hypothyroidism. Adrenal insuff tsh ok; not on rx  Will discontinue stress dose steroids today 9-14.     DVT prophylaxis: scd Code Status: full code.  Family  Communication: family updated twice  Disposition Plan: not stable to be discharge  SIGNIFICANT EVENTS: 9/11 > admitted with presumed UGIB and shock (combo of sepsis of unclear etiology as well as hypovolemic) > initially planned for emergent EGD > aborted due to hypotension > transferred to ICU. 9/12 BC + Ecoli. Hemodynamically stable. EGD completed: NSAID related gastritis. ABX narrowed.   Consultants:   Ccm    Procedures:  ECHO; Left ventricle: The cavity size was normal. Wall thickness was   normal. Systolic function was normal. The estimated ejection   fraction was in the range of 60% to 65%. Wall motion was normal;   there were no regional wall motion abnormalities. Doppler   parameters are consistent with abnormal left ventricular   relaxation (grade 1 diastolic dysfunction). - Mitral valve: There was mild regurgitation. - Pulmonary arteries: Systolic pressure was mildly increased. PA   peak pressure: 35 mm Hg (S). - Pericardium, extracardiac: There was a left pleural effusion.     Antimicrobials: Primaxin    Subjective: Patient is back to her baseline. Alert, answering questions. Ate breakfast   Objective: Vitals:   06/15/16 1820 06/15/16 2037 06/16/16 0430 06/16/16 0512  BP: (!) 150/75 (!) 177/81  (!) 171/91  Pulse: 85 86  70  Resp:  17  17  Temp:  98.7 F (37.1 C)  98 F (36.7 C)  TempSrc:  Oral  Oral  SpO2:  94%  92%  Weight:   51.3 kg (113 lb 3.2 oz)   Height:        Intake/Output Summary (Last 24 hours) at 06/16/16 0920 Last data filed at 06/16/16 0259  Gross per 24 hour  Intake              340 ml  Output             1000 ml  Net             -660 ml   Filed Weights   06/13/16 1247 06/14/16 0501 06/16/16 0430  Weight: 52.1 kg (114 lb 13.8 oz) 55 kg (121 lb 4.8 oz) 51.3 kg (113 lb 3.2 oz)    Examination:  General exam: Appears calm and comfortable  Respiratory system: Clear to auscultation. Respiratory effort normal. Cardiovascular system:  S1 & S2 heard, RRR. No JVD, murmurs, rubs, gallops or clicks. No pedal edema. Gastrointestinal system: Abdomen is nondistended, soft and nontender. No organomegaly or masses felt. Normal bowel sounds heard. Central nervous system: Alert and oriented. No focal neurological deficits. Extremities: Symmetric 5 x 5 power. Skin: No rashes, lesions or ulcers Psychiatry: Judgement and insight appear normal. Mood & affect appropriate.     Data Reviewed: I have personally reviewed following labs and imaging studies  CBC:  Recent Labs Lab 06/13/16 0153 06/13/16 0557 06/13/16 1054  06/13/16 1501 06/14/16 0000 06/14/16 0521 06/15/16 0747 06/16/16 0437  WBC 1.0* 8.5 8.5  --   --   --  21.2* 19.5* 18.1*  NEUTROABS 0.7*  --   --   --   --   --   --   --  16.4*  HGB 8.2* 8.1* 7.8*  < > 6.9* 10.9* 11.4* 11.6* 12.5  HCT 25.4* 24.9* 23.6*  < > 21.1* 32.9* 34.6* 35.0* 37.5  MCV 95.5 95.4 95.9  --   --   --  92.8 90.7 92.6  PLT 209 187 201  --   --   --  178 211 265  < > = values in this interval not displayed. Basic Metabolic Panel:  Recent Labs Lab 06/13/16 0153 06/13/16 0557 06/13/16 1054 06/13/16 1232 06/14/16 0521 06/15/16 0747 06/16/16 0437  NA 139  --  142 143 142 141 145  K 3.3*  --  4.0 4.7 4.5 3.4* 3.8  CL 112*  --  115* 114* 117* 117* 111  CO2 19*  --  18*  --  16* 17* 27  GLUCOSE 99  --  102* 91 100* 154* 131*  BUN 21*  --  19 19 19  28* 21*  CREATININE 1.57*  --  1.46* 1.30* 1.22* 1.13* 1.09*  CALCIUM 8.6*  --  8.0*  --  8.4* 8.8* 9.1  MG  --  1.1*  --   --  1.9 1.7  --   PHOS  --   --   --   --  3.5  --   --    GFR: Estimated Creatinine Clearance: 30.6 mL/min (by C-G formula based on SCr of 1.09 mg/dL (H)). Liver Function Tests:  Recent Labs Lab 06/13/16 0153 06/15/16 0747  AST 25 49*  ALT 15 95*  ALKPHOS 54 90  BILITOT 0.8 0.6  PROT 5.2* 5.0*  ALBUMIN 3.0* 2.4*   No results for input(s): LIPASE, AMYLASE in the last 168 hours. No results for input(s):  AMMONIA in the last 168 hours. Coagulation Profile:  Recent Labs Lab 06/13/16 0557  INR 1.20   Cardiac Enzymes:  Recent Labs Lab 06/14/16 0521 06/14/16 1315 06/15/16 1106 06/15/16 1524 06/15/16 2147  TROPONINI 0.75* 1.07* 0.66* 0.53* 0.40*   BNP (last 3 results) No results for input(s): PROBNP in the last 8760 hours. HbA1C: No results for input(s): HGBA1C in the last 72 hours. CBG:  Recent Labs Lab 06/14/16 0838  GLUCAP 88   Lipid Profile: No results for input(s): CHOL, HDL, LDLCALC, TRIG, CHOLHDL, LDLDIRECT in the last 72 hours. Thyroid Function Tests:  Recent Labs  06/14/16 0000  TSH 0.433   Anemia Panel: No results for input(s): VITAMINB12, FOLATE, FERRITIN, TIBC, IRON, RETICCTPCT in the last 72 hours. Sepsis Labs:  Recent Labs Lab 06/13/16 0156 06/13/16 0424 06/13/16 0557 06/13/16 0600 06/13/16 1054 06/14/16 0521 06/15/16 0747  PROCALCITON  --   --  30.85  --   --  42.20 28.79  LATICACIDVEN 2.91* 1.50  --  1.3 1.6  --   --     Recent Results (from the past 240 hour(s))  Blood Culture (routine x 2)     Status: Abnormal (Preliminary result)   Collection Time: 06/13/16  1:28 AM  Result Value Ref Range Status   Specimen Description BLOOD LEFT ARM  Final   Special Requests BOTTLES DRAWN AEROBIC AND ANAEROBIC  Final   Culture  Setup Time   Final    GRAM NEGATIVE RODS IN BOTH AEROBIC AND ANAEROBIC BOTTLES CRITICAL RESULT CALLED TO, READ BACK BY AND VERIFIED WITH: T.EGAN, PHARMD AT1720 ON 06/13/16 BY C. JESSUP, MLT.    Culture (A)  Final    ESCHERICHIA COLI SUSCEPTIBILITIES PERFORMED ON PREVIOUS CULTURE WITHIN THE LAST 5 DAYS.    Report Status  PENDING  Incomplete   Organism ID, Bacteria ESCHERICHIA COLI  Final      Susceptibility   Escherichia coli - MIC*    AMPICILLIN <=2 SENSITIVE Sensitive     CEFAZOLIN <=4 SENSITIVE Sensitive     CEFEPIME <=1 SENSITIVE Sensitive     CEFTAZIDIME <=1 SENSITIVE Sensitive     CEFTRIAXONE <=1 SENSITIVE  Sensitive     CIPROFLOXACIN <=0.25 SENSITIVE Sensitive     GENTAMICIN <=1 SENSITIVE Sensitive     IMIPENEM <=0.25 SENSITIVE Sensitive     TRIMETH/SULFA <=20 SENSITIVE Sensitive     AMPICILLIN/SULBACTAM <=2 SENSITIVE Sensitive     PIP/TAZO <=4 SENSITIVE Sensitive     Extended ESBL NEGATIVE Sensitive     * ESCHERICHIA COLI  Blood Culture ID Panel (Reflexed)     Status: Abnormal   Collection Time: 06/13/16  1:28 AM  Result Value Ref Range Status   Enterococcus species NOT DETECTED NOT DETECTED Final   Listeria monocytogenes NOT DETECTED NOT DETECTED Final   Staphylococcus species NOT DETECTED NOT DETECTED Final   Staphylococcus aureus NOT DETECTED NOT DETECTED Final   Streptococcus species NOT DETECTED NOT DETECTED Final   Streptococcus agalactiae NOT DETECTED NOT DETECTED Final   Streptococcus pneumoniae NOT DETECTED NOT DETECTED Final   Streptococcus pyogenes NOT DETECTED NOT DETECTED Final   Acinetobacter baumannii NOT DETECTED NOT DETECTED Final   Enterobacteriaceae species DETECTED (A) NOT DETECTED Final    Comment: CRITICAL RESULT CALLED TO, READ BACK BY AND VERIFIED WITH: T. EGAN, PHARMD AT 1720 ON 06/13/16 BY C. JESSUP, MLT.    Enterobacter cloacae complex NOT DETECTED NOT DETECTED Final   Escherichia coli DETECTED (A) NOT DETECTED Final    Comment: CRITICAL RESULT CALLED TO, READ BACK BY AND VERIFIED WITH: T. EGAN, PHARMD AT 1720 ON 06/13/16 BY C. JESSUP, MLT.    Klebsiella oxytoca NOT DETECTED NOT DETECTED Final   Klebsiella pneumoniae NOT DETECTED NOT DETECTED Final   Proteus species NOT DETECTED NOT DETECTED Final   Serratia marcescens NOT DETECTED NOT DETECTED Final   Carbapenem resistance NOT DETECTED NOT DETECTED Final   Haemophilus influenzae NOT DETECTED NOT DETECTED Final   Neisseria meningitidis NOT DETECTED NOT DETECTED Final   Pseudomonas aeruginosa NOT DETECTED NOT DETECTED Final   Candida albicans NOT DETECTED NOT DETECTED Final   Candida glabrata NOT DETECTED  NOT DETECTED Final   Candida krusei NOT DETECTED NOT DETECTED Final   Candida parapsilosis NOT DETECTED NOT DETECTED Final   Candida tropicalis NOT DETECTED NOT DETECTED Final  Urine culture     Status: Abnormal   Collection Time: 06/13/16  1:55 AM  Result Value Ref Range Status   Specimen Description URINE, RANDOM  Final   Special Requests NONE  Final   Culture >=100,000 COLONIES/mL ESCHERICHIA COLI (A)  Final   Report Status 06/15/2016 FINAL  Final   Organism ID, Bacteria ESCHERICHIA COLI (A)  Final      Susceptibility   Escherichia coli - MIC*    AMPICILLIN <=2 SENSITIVE Sensitive     CEFAZOLIN <=4 SENSITIVE Sensitive     CEFTRIAXONE <=1 SENSITIVE Sensitive     CIPROFLOXACIN <=0.25 SENSITIVE Sensitive     GENTAMICIN <=1 SENSITIVE Sensitive     IMIPENEM <=0.25 SENSITIVE Sensitive     NITROFURANTOIN <=16 SENSITIVE Sensitive     TRIMETH/SULFA <=20 SENSITIVE Sensitive     AMPICILLIN/SULBACTAM <=2 SENSITIVE Sensitive     PIP/TAZO <=4 SENSITIVE Sensitive     Extended ESBL NEGATIVE Sensitive     * >=  100,000 COLONIES/mL ESCHERICHIA COLI  Blood Culture (routine x 2)     Status: Abnormal   Collection Time: 06/13/16  2:15 AM  Result Value Ref Range Status   Specimen Description BLOOD LEFT WRIST  Final   Special Requests IN PEDIATRIC BOTTLE  Final   Culture  Setup Time   Final    GRAM NEGATIVE RODS AEROBIC BOTTLE ONLY CRITICAL RESULT CALLED TO, READ BACK BY AND VERIFIED WITH: T. EGAN, PHARMD AT 1720 ON 06/13/16 BY C. JESSUP, MLT.    Culture (A)  Final    ESCHERICHIA COLI SUSCEPTIBILITIES PERFORMED ON PREVIOUS CULTURE WITHIN THE LAST 5 DAYS.    Report Status 06/15/2016 FINAL  Final  Respiratory Panel by PCR     Status: None   Collection Time: 06/13/16  9:19 AM  Result Value Ref Range Status   Adenovirus NOT DETECTED NOT DETECTED Final   Coronavirus 229E NOT DETECTED NOT DETECTED Final   Coronavirus HKU1 NOT DETECTED NOT DETECTED Final   Coronavirus NL63 NOT DETECTED NOT  DETECTED Final   Coronavirus OC43 NOT DETECTED NOT DETECTED Final   Metapneumovirus NOT DETECTED NOT DETECTED Final   Rhinovirus / Enterovirus NOT DETECTED NOT DETECTED Final   Influenza A NOT DETECTED NOT DETECTED Final   Influenza B NOT DETECTED NOT DETECTED Final   Parainfluenza Virus 1 NOT DETECTED NOT DETECTED Final   Parainfluenza Virus 2 NOT DETECTED NOT DETECTED Final   Parainfluenza Virus 3 NOT DETECTED NOT DETECTED Final   Parainfluenza Virus 4 NOT DETECTED NOT DETECTED Final   Respiratory Syncytial Virus NOT DETECTED NOT DETECTED Final   Bordetella pertussis NOT DETECTED NOT DETECTED Final   Chlamydophila pneumoniae NOT DETECTED NOT DETECTED Final   Mycoplasma pneumoniae NOT DETECTED NOT DETECTED Final         Radiology Studies: Ct Head Wo Contrast  Result Date: 06/15/2016 CLINICAL DATA:  80 year old female with sudden onset of confusion this morning. No associated head pain. EXAM: CT HEAD WITHOUT CONTRAST TECHNIQUE: Contiguous axial images were obtained from the base of the skull through the vertex without intravenous contrast. COMPARISON:  Head CT 12/08/2009. FINDINGS: Brain: Mild cerebral atrophy. Patchy and confluent areas of decreased attenuation are noted throughout the deep and periventricular white matter of the cerebral hemispheres bilaterally, compatible with chronic microvascular ischemic disease. No evidence of acute infarction, hemorrhage, hydrocephalus, extra-axial collection or mass lesion/mass effect. Vascular: No hyperdense vessel. Calcified atherosclerotic plaque in the right vertebral artery (image 3 of series 2), new compared to the prior examination. Based on the appearance, there may be a hemodynamically significant stenosis in this vessel. Skull: Normal. Negative for fracture or focal lesion. Sinuses/Orbits: No acute finding. Other: None. IMPRESSION: 1. No definite acute intracranial abnormalities. 2. There is a new densely calcified plaque in the right  vertebral artery (as above) compared to prior study from 12/08/2009. Although not assessable on today's noncontrast CT examination, the appearance could suggest a hemodynamically significant stenosis. If there are signs or symptoms of vertebrobasilar insufficiency, further evaluation with noncontrast brain MRI/MRA could be considered. 3. Mild cerebral atrophy with mild chronic microvascular ischemic changes in the cerebral white matter, as above. Electronically Signed   By: Trudie Reed M.D.   On: 06/15/2016 12:23   Dg Chest Port 1 View  Result Date: 06/15/2016 CLINICAL DATA:  ARDS, weakness, hypertension EXAM: PORTABLE CHEST 1 VIEW COMPARISON:  Portable exam 0636 hours compared to 06/14/2006 D FINDINGS: RIGHT jugular central venous catheter with tip projecting over SVC. Rotation to LEFT.  Enlargement of cardiac silhouette with pulmonary vascular congestion. RIGHT pleural effusion present. Atelectasis versus consolidation in BILATERAL lower lobes. No pneumothorax. Bones demineralized. IMPRESSION: Persistent bibasilar atelectasis versus consolidation with increased RIGHT pleural effusion. Enlargement of cardiac silhouette. Electronically Signed   By: Ulyses Southward M.D.   On: 06/15/2016 08:03        Scheduled Meds: . hydrocortisone sod succinate (SOLU-CORTEF) inj  50 mg Intravenous Q12H  . imipenem-cilastatin  500 mg Intravenous Q12H  . metoprolol tartrate  25 mg Oral BID  . omega-3 acid ethyl esters  1 g Oral Daily  . [START ON 06/17/2016] pantoprazole  40 mg Intravenous Q12H  . sodium bicarbonate  650 mg Oral BID  . sodium chloride  2,000 mL Intravenous Once   Continuous Infusions: . pantoprozole (PROTONIX) infusion 8 mg/hr (06/14/16 1041)     LOS: 3 days    Time spent:35 minutes.     Alba Cory, MD [facility] Pager [phone]  If 7PM-7AM, please contact night-coverage [email or web address] Password [email or web address] 06/16/2016, 9:20 AM

## 2016-06-16 NOTE — Consult Note (Signed)
Requesting Physician: Dr. Sunnie Nielsen    Chief Complaint: Stroke  History obtained from:  Patient     HPI:                                                                                                                                         [name] is an 80 y.o. female brought to hospital with UGIB and GNR bacteremia. Patient has had encephalopathy since admission. MRI was obtained for Delirium while in hospital and revealed a right PCA infarct. MRA showed Occlusion of the distal right PCA. Note is made of a fetal type right PCA. Along with  Atheromatous irregularity with severe tandem nonocclusive the stenoses within the left PCA, Severe nonocclusive left A2 stenosis. Neurology was asked to see due to MRI findings.   Currently she is Afebrile with WBC 18.1--down from 21.2.   Currently patient has not confused and feels back to baseline. Daughter at bedside also concurs that she is at baseline. In discussing her recent stroke, patient has no idea when this might of happened as she does not feel that she had any symptoms.   Date last known well: Unable to determine Time last known well: Unable to determine tPA Given: No: no LSN   Past Medical History:  Diagnosis Date  . Blood transfusion without reported diagnosis   . CKD (chronic kidney disease), stage III   . Hypertension   . PONV (postoperative nausea and vomiting)    nausea  . Thyroid disease     Past Surgical History:  Procedure Laterality Date  . ABDOMINAL HYSTERECTOMY    . CHOLECYSTECTOMY N/A 08/13/2015   Procedure: LAPAROSCOPIC CHOLECYSTECTOMY WITH INTRAOPERATIVE CHOLANGIOGRAM;  Surgeon: Avel Peace, MD;  Location: [facility] OR;  Service: General;  Laterality: N/A;  . ESOPHAGOGASTRODUODENOSCOPY N/A 06/14/2016   Procedure: ESOPHAGOGASTRODUODENOSCOPY (EGD);  Surgeon: Jeani Hawking, MD;  Location: [facility] ENDOSCOPY;  Service: Endoscopy;  Laterality: N/A;  . THYROIDECTOMY, PARTIAL      Family History  Problem Relation Age of  Onset  . Stroke Mother    Social History:  reports that she has never smoked. She has never used smokeless tobacco. She reports that she does not drink alcohol or use drugs.  Allergies:  Allergies  Allergen Reactions  . Amoxicillin Hives and Itching    Has patient had a PCN reaction causing immediate rash, facial/tongue/throat swelling, SOB or lightheadedness with hypotension: Yes Has patient had a PCN reaction causing severe rash involving mucus membranes or skin necrosis: Yes Has patient had a PCN reaction that required hospitalization No Has patient had a PCN reaction occurring within the last 10 years: Yes If all of the above answers are "NO", then may proceed with Cephalosporin use.     Medications:  Prior to Admission:  Prescriptions Prior to Admission  Medication Sig Dispense Refill Last Dose  . amLODipine (NORVASC) 10 MG tablet Take 10 mg by mouth daily.   06/12/2016 at Unknown time  . cloNIDine (CATAPRES) 0.1 MG tablet Take 0.1 mg by mouth daily.   06/12/2016 at Unknown time  . fish oil-omega-3 fatty acids 1000 MG capsule Take 1 g by mouth daily.   06/12/2016 at Unknown time  . LORazepam (ATIVAN) 0.5 MG tablet Take 0.5 mg by mouth 2 (two) times daily as needed. anxiety  0 Past Week at Unknown time  . metoprolol tartrate (LOPRESSOR) 25 MG tablet Take 25 mg by mouth 2 (two) times daily.   06/12/2016 at 1800  . naproxen sodium (ANAPROX) 220 MG tablet Take 220 mg by mouth 2 (two) times daily as needed (pain).   06/12/2016 at Unknown time   Scheduled: . amLODipine  2.5 mg Oral Daily  . imipenem-cilastatin  500 mg Intravenous Q8H  . metoprolol tartrate  25 mg Oral BID  . omega-3 acid ethyl esters  1 g Oral Daily  . pantoprazole  40 mg Intravenous Q12H  . sodium bicarbonate  650 mg Oral BID  . sodium chloride  2,000 mL Intravenous Once    ROS:                                                                                                                                        History obtained from the patient  General ROS: negative for - chills, fatigue, fever, night sweats, weight gain or weight loss Psychological ROS: negative for - behavioral disorder, hallucinations, memory difficulties, mood swings or suicidal ideation Ophthalmic ROS: negative for - blurry vision, double vision, eye pain or loss of vision ENT ROS: negative for - epistaxis, nasal discharge, oral lesions, sore throat, tinnitus or vertigo Allergy and Immunology ROS: negative for - hives or itchy/watery eyes Hematological and Lymphatic ROS: negative for - bleeding problems, bruising or swollen lymph nodes Endocrine ROS: negative for - galactorrhea, hair pattern changes, polydipsia/polyuria or temperature intolerance Respiratory ROS: negative for - cough, hemoptysis, shortness of breath or wheezing Cardiovascular ROS: negative for - chest pain, dyspnea on exertion, edema or irregular heartbeat Gastrointestinal ROS: negative for - abdominal pain, diarrhea, hematemesis, nausea/vomiting or stool incontinence Genito-Urinary ROS: negative for - dysuria, hematuria, incontinence or urinary frequency/urgency Musculoskeletal ROS: negative for - joint swelling or muscular weakness Neurological ROS: as noted in HPI Dermatological ROS: negative for rash and skin lesion changes  Neurologic Examination:  Blood pressure (!) 180/72, pulse 69, temperature 97.7 F (36.5 C), temperature source Oral, resp. rate 17, height 5' (1.524 m), weight 51.3 kg (113 lb 3.2 oz), SpO2 95 %.  HEENT-  Normocephalic, no lesions, without obvious abnormality.  Normal external eye and conjunctiva.  Normal TM's bilaterally.  Normal auditory canals and external ears. Normal external nose, mucus membranes and septum.  Normal  pharynx. Cardiovascular- S1, S2 normal, pulses palpable throughout   Lungs- chest clear, no wheezing, rales, normal symmetric air entry Abdomen- normal findings: bowel sounds normal Extremities- no edema Lymph-no adenopathy palpable Musculoskeletal-no joint tenderness, deformity or swelling Skin-warm and dry, no hyperpigmentation, vitiligo, or suspicious lesions  Neurological Examination Mental Status: Alert, oriented, thought content appropriate.  Speech fluent without evidence of aphasia.  Able to follow 3 step commands without difficulty. Cranial Nerves: II: Discs flat bilaterally; Visual fields grossly normal, pupils equal, round, reactive to light and accommodation III,IV, VI: ptosis not present, extra-ocular motions intact bilaterally V,VII: smile symmetric, facial light touch sensation normal bilaterally VIII: hearing normal bilaterally IX,X: uvula rises symmetrically XI: bilateral shoulder shrug XII: midline tongue extension Motor: Right : Upper extremity   5/5    Left:     Upper extremity   5/5  Lower extremity   5/5     Lower extremity   5/5 Tone and bulk:normal tone throughout; no atrophy noted Sensory: Pinprick and light touch intact throughout, bilaterally Deep Tendon Reflexes: 2+ and symmetric throughout Plantars: Right: downgoing   Left: downgoing Cerebellar: normal finger-to-nose and normal heel-to-shin test Gait: normal gait and station       Lab Results: Basic Metabolic Panel:  Recent Labs Lab 06/13/16 0153 06/13/16 0557 06/13/16 1054 06/13/16 1232 06/14/16 0521 06/15/16 0747 06/16/16 0437  NA 139  --  142 143 142 141 145  K 3.3*  --  4.0 4.7 4.5 3.4* 3.8  CL 112*  --  115* 114* 117* 117* 111  CO2 19*  --  18*  --  16* 17* 27  GLUCOSE 99  --  102* 91 100* 154* 131*  BUN 21*  --  19 19 19  28* 21*  CREATININE 1.57*  --  1.46* 1.30* 1.22* 1.13* 1.09*  CALCIUM 8.6*  --  8.0*  --  8.4* 8.8* 9.1  MG  --  1.1*  --   --  1.9 1.7  --   PHOS  --   --    --   --  3.5  --   --     Liver Function Tests:  Recent Labs Lab 06/13/16 0153 06/15/16 0747  AST 25 49*  ALT 15 95*  ALKPHOS 54 90  BILITOT 0.8 0.6  PROT 5.2* 5.0*  ALBUMIN 3.0* 2.4*   No results for input(s): LIPASE, AMYLASE in the last 168 hours. No results for input(s): AMMONIA in the last 168 hours.  CBC:  Recent Labs Lab 06/13/16 0153 06/13/16 0557 06/13/16 1054  06/13/16 1501 06/14/16 0000 06/14/16 0521 06/15/16 0747 06/16/16 0437  WBC 1.0* 8.5 8.5  --   --   --  21.2* 19.5* 18.1*  NEUTROABS 0.7*  --   --   --   --   --   --   --  16.4*  HGB 8.2* 8.1* 7.8*  < > 6.9* 10.9* 11.4* 11.6* 12.5  HCT 25.4* 24.9* 23.6*  < > 21.1* 32.9* 34.6* 35.0* 37.5  MCV 95.5 95.4 95.9  --   --   --  92.8 90.7 92.6  PLT 209 187 201  --   --   --  178 211 265  < > = values in this interval not displayed.  Cardiac Enzymes:  Recent Labs Lab 06/14/16 0521 06/14/16 1315 06/15/16 1106 06/15/16 1524 06/15/16 2147  TROPONINI 0.75* 1.07* 0.66* 0.53* 0.40*    Lipid Panel: No results for input(s): CHOL, TRIG, HDL, CHOLHDL, VLDL, LDLCALC in the last 168 hours.  CBG:  Recent Labs Lab 06/14/16 0838  GLUCAP 88    Microbiology: Results for orders placed or performed during the hospital encounter of 06/13/16  Blood Culture (routine x 2)     Status: Abnormal   Collection Time: 06/13/16  1:28 AM  Result Value Ref Range Status   Specimen Description BLOOD LEFT ARM  Final   Special Requests BOTTLES DRAWN AEROBIC AND ANAEROBIC  Final   Culture  Setup Time   Final    GRAM NEGATIVE RODS IN BOTH AEROBIC AND ANAEROBIC BOTTLES CRITICAL RESULT CALLED TO, READ BACK BY AND VERIFIED WITH: T.EGAN, PHARMD AT1720 ON 06/13/16 BY C. JESSUP, MLT.    Culture (A)  Final    ESCHERICHIA COLI SUSCEPTIBILITIES PERFORMED ON PREVIOUS CULTURE WITHIN THE LAST 5 DAYS.    Report Status 06/16/2016 FINAL  Final   Organism ID, Bacteria ESCHERICHIA COLI  Final      Susceptibility   Escherichia coli -  MIC*    AMPICILLIN <=2 SENSITIVE Sensitive     CEFAZOLIN <=4 SENSITIVE Sensitive     CEFEPIME <=1 SENSITIVE Sensitive     CEFTAZIDIME <=1 SENSITIVE Sensitive     CEFTRIAXONE <=1 SENSITIVE Sensitive     CIPROFLOXACIN <=0.25 SENSITIVE Sensitive     GENTAMICIN <=1 SENSITIVE Sensitive     IMIPENEM <=0.25 SENSITIVE Sensitive     TRIMETH/SULFA <=20 SENSITIVE Sensitive     AMPICILLIN/SULBACTAM <=2 SENSITIVE Sensitive     PIP/TAZO <=4 SENSITIVE Sensitive     Extended ESBL NEGATIVE Sensitive     * ESCHERICHIA COLI  Blood Culture ID Panel (Reflexed)     Status: Abnormal   Collection Time: 06/13/16  1:28 AM  Result Value Ref Range Status   Enterococcus species NOT DETECTED NOT DETECTED Final   Listeria monocytogenes NOT DETECTED NOT DETECTED Final   Staphylococcus species NOT DETECTED NOT DETECTED Final   Staphylococcus aureus NOT DETECTED NOT DETECTED Final   Streptococcus species NOT DETECTED NOT DETECTED Final   Streptococcus agalactiae NOT DETECTED NOT DETECTED Final   Streptococcus pneumoniae NOT DETECTED NOT DETECTED Final   Streptococcus pyogenes NOT DETECTED NOT DETECTED Final   Acinetobacter baumannii NOT DETECTED NOT DETECTED Final   Enterobacteriaceae species DETECTED (A) NOT DETECTED Final    Comment: CRITICAL RESULT CALLED TO, READ BACK BY AND VERIFIED WITH: T. EGAN, PHARMD AT 1720 ON 06/13/16 BY C. JESSUP, MLT.    Enterobacter cloacae complex NOT DETECTED NOT DETECTED Final   Escherichia coli DETECTED (A) NOT DETECTED Final    Comment: CRITICAL RESULT CALLED TO, READ BACK BY AND VERIFIED WITH: T. EGAN, PHARMD AT 1720 ON 06/13/16 BY C. JESSUP, MLT.    Klebsiella oxytoca NOT DETECTED NOT DETECTED Final   Klebsiella pneumoniae NOT DETECTED NOT DETECTED Final   Proteus species NOT DETECTED NOT DETECTED Final   Serratia marcescens NOT DETECTED NOT DETECTED Final   Carbapenem resistance NOT DETECTED NOT DETECTED Final   Haemophilus influenzae NOT DETECTED NOT DETECTED Final    Neisseria meningitidis NOT DETECTED NOT DETECTED Final   Pseudomonas aeruginosa NOT DETECTED NOT DETECTED Final  Candida albicans NOT DETECTED NOT DETECTED Final   Candida glabrata NOT DETECTED NOT DETECTED Final   Candida krusei NOT DETECTED NOT DETECTED Final   Candida parapsilosis NOT DETECTED NOT DETECTED Final   Candida tropicalis NOT DETECTED NOT DETECTED Final  Urine culture     Status: Abnormal   Collection Time: 06/13/16  1:55 AM  Result Value Ref Range Status   Specimen Description URINE, RANDOM  Final   Special Requests NONE  Final   Culture >=100,000 COLONIES/mL ESCHERICHIA COLI (A)  Final   Report Status 06/15/2016 FINAL  Final   Organism ID, Bacteria ESCHERICHIA COLI (A)  Final      Susceptibility   Escherichia coli - MIC*    AMPICILLIN <=2 SENSITIVE Sensitive     CEFAZOLIN <=4 SENSITIVE Sensitive     CEFTRIAXONE <=1 SENSITIVE Sensitive     CIPROFLOXACIN <=0.25 SENSITIVE Sensitive     GENTAMICIN <=1 SENSITIVE Sensitive     IMIPENEM <=0.25 SENSITIVE Sensitive     NITROFURANTOIN <=16 SENSITIVE Sensitive     TRIMETH/SULFA <=20 SENSITIVE Sensitive     AMPICILLIN/SULBACTAM <=2 SENSITIVE Sensitive     PIP/TAZO <=4 SENSITIVE Sensitive     Extended ESBL NEGATIVE Sensitive     * >=100,000 COLONIES/mL ESCHERICHIA COLI  Blood Culture (routine x 2)     Status: Abnormal   Collection Time: 06/13/16  2:15 AM  Result Value Ref Range Status   Specimen Description BLOOD LEFT WRIST  Final   Special Requests IN PEDIATRIC BOTTLE  Final   Culture  Setup Time   Final    GRAM NEGATIVE RODS AEROBIC BOTTLE ONLY CRITICAL RESULT CALLED TO, READ BACK BY AND VERIFIED WITH: T. EGAN, PHARMD AT 1720 ON 06/13/16 BY C. JESSUP, MLT.    Culture (A)  Final    ESCHERICHIA COLI SUSCEPTIBILITIES PERFORMED ON PREVIOUS CULTURE WITHIN THE LAST 5 DAYS.    Report Status 06/15/2016 FINAL  Final  Respiratory Panel by PCR     Status: None   Collection Time: 06/13/16  9:19 AM  Result Value Ref Range  Status   Adenovirus NOT DETECTED NOT DETECTED Final   Coronavirus 229E NOT DETECTED NOT DETECTED Final   Coronavirus HKU1 NOT DETECTED NOT DETECTED Final   Coronavirus NL63 NOT DETECTED NOT DETECTED Final   Coronavirus OC43 NOT DETECTED NOT DETECTED Final   Metapneumovirus NOT DETECTED NOT DETECTED Final   Rhinovirus / Enterovirus NOT DETECTED NOT DETECTED Final   Influenza A NOT DETECTED NOT DETECTED Final   Influenza B NOT DETECTED NOT DETECTED Final   Parainfluenza Virus 1 NOT DETECTED NOT DETECTED Final   Parainfluenza Virus 2 NOT DETECTED NOT DETECTED Final   Parainfluenza Virus 3 NOT DETECTED NOT DETECTED Final   Parainfluenza Virus 4 NOT DETECTED NOT DETECTED Final   Respiratory Syncytial Virus NOT DETECTED NOT DETECTED Final   Bordetella pertussis NOT DETECTED NOT DETECTED Final   Chlamydophila pneumoniae NOT DETECTED NOT DETECTED Final   Mycoplasma pneumoniae NOT DETECTED NOT DETECTED Final    Coagulation Studies: No results for input(s): LABPROT, INR in the last 72 hours.  Imaging: Ct Head Wo Contrast  Result Date: 06/15/2016 CLINICAL DATA:  80 year old female with sudden onset of confusion this morning. No associated head pain. EXAM: CT HEAD WITHOUT CONTRAST TECHNIQUE: Contiguous axial images were obtained from the base of the skull through the vertex without intravenous contrast. COMPARISON:  Head CT 12/08/2009. FINDINGS: Brain: Mild cerebral atrophy. Patchy and confluent areas of decreased attenuation are noted throughout  the deep and periventricular white matter of the cerebral hemispheres bilaterally, compatible with chronic microvascular ischemic disease. No evidence of acute infarction, hemorrhage, hydrocephalus, extra-axial collection or mass lesion/mass effect. Vascular: No hyperdense vessel. Calcified atherosclerotic plaque in the right vertebral artery (image 3 of series 2), new compared to the prior examination. Based on the appearance, there may be a hemodynamically  significant stenosis in this vessel. Skull: Normal. Negative for fracture or focal lesion. Sinuses/Orbits: No acute finding. Other: None. IMPRESSION: 1. No definite acute intracranial abnormalities. 2. There is a new densely calcified plaque in the right vertebral artery (as above) compared to prior study from 12/08/2009. Although not assessable on today's noncontrast CT examination, the appearance could suggest a hemodynamically significant stenosis. If there are signs or symptoms of vertebrobasilar insufficiency, further evaluation with noncontrast brain MRI/MRA could be considered. 3. Mild cerebral atrophy with mild chronic microvascular ischemic changes in the cerebral white matter, as above. Electronically Signed   By: Trudie Reedaniel  Entrikin M.D.   On: 06/15/2016 12:23   [name] Head Wo Contrast  Result Date: 06/16/2016 CLINICAL DATA:  Initial valuation for delirium. Evaluate for stroke. EXAM: MRI HEAD WITHOUT CONTRAST MRA HEAD WITHOUT CONTRAST TECHNIQUE: Multiplanar, multiecho pulse sequences of the brain and surrounding structures were obtained without intravenous contrast. Angiographic images of the head were obtained using MRA technique without contrast. COMPARISON:  Prior CT 07/15/2016. FINDINGS: MRI HEAD FINDINGS Mild diffuse prominence of the CSF containing spaces is compatible with generalized cerebral atrophy, within normal limits for patient age. Patchy and confluent T2/FLAIR hyperintensity within the periventricular and deep white matter both cerebral hemispheres most compatible with chronic microvascular ischemic disease, moderate in nature. There are patchy multi vocal folds ladder of restricted diffusion involving the right PCA territory, involving the right occipital lobe and mesial right temporal lobe, with additional 0 punctate infarct within the right thalamus. Associated T2/FLAIR signal abnormality. ADC signal appears to be somewhat normalizing at this point, compatible with acute/early subacute  infarct. No associated hemorrhage or mass effect. No other acute infarct. Gray-white matter differentiation otherwise maintained. Major intracranial vascular flow voids are grossly preserved. Left vertebral artery appears to be hypoplastic. Prominent atheromatous plaque at the right V4 segment. No mass lesion, midline shift, or mass effect. No hydrocephalus. No extra-axial fluid collection. Major dural sinuses are grossly patent. Craniocervical junction normal. Visualized upper cervical spine unremarkable. Pituitary gland within normal limits. No acute abnormality about the globes and orbits. Patient is status post lens extraction bilaterally. Paranasal sinuses are clear. Mild scattered opacity within the bilateral mastoid air cells, of doubtful clinical significance. Inner ear structures grossly normal. Bone marrow signal intensity within normal limits. No scalp soft tissue abnormality. MRA HEAD FINDINGS ANTERIOR CIRCULATION: Visualized distal cervical segments of the internal carotid arteries are widely patent with antegrade flow. Petrous, cavernous, and supraclinoid segments widely patent without flow-limiting stenosis. A1 segments patent bilaterally. The left A1 segment is hypoplastic. Anterior communicating artery normal. Short-segment severe stenosis left A2 segment present (series 5, image 64). Right ACA demonstrates mild irregularity without severe stenosis. M1 segments demonstrate mild atheromatous irregularity. Mild smooth narrowing of the distal M1 segments bilaterally without focal high-grade stenosis. Distal small vessel atheromatous irregularity within the MCA branches bilaterally. POSTERIOR CIRCULATION: Right vertebral artery dominant and patent to the vertebrobasilar junction without focal high-grade stenosis. Hypoplastic left vertebral artery terminates in PICA. Posterior inferior cerebral arteries themselves are patent proximally. Basilar artery demonstrates mild multi focal atheromatous  irregularity and stenoses. Superior cerebral arteries patent bilaterally.  Basilar tip mildly ectatic without focal aneurysm. Left P1 segment somewhat hypoplastic with a prominent left posterior communicating artery. Multifocal atheromatous irregularity with tandem severe nonocclusive stenoses within the left PCA (series 554, image 5). Left PCA is opacified to its distal aspect. There is fetal origin of the right PCA supplied via a patent right posterior communicating artery. Right PCA 8 attenuated proximally Ing and is not seen distally, suspected to be occluded given the right PCA infarcts. No aneurysm or vascular malformation. IMPRESSION: MRI HEAD IMPRESSION: 1. Acute/early subacute ischemic nonhemorrhagic right PCA territory infarcts. 2. Moderate chronic microvascular ischemic disease. MRA HEAD IMPRESSION: 1. Occlusion of the distal right PCA. Note is made of a fetal type right PCA. 2. Atheromatous irregularity with severe tandem nonocclusive the stenoses within the left PCA. 3. Severe nonocclusive left A2 stenosis. 4. Additional more mild atheromatous disease involving the anterior posterior circulation as above. Electronically Signed   By: Rise Mu M.D.   On: 06/16/2016 12:53   Mr Brain Wo Contrast  Result Date: 06/16/2016 CLINICAL DATA:  Initial valuation for delirium. Evaluate for stroke. EXAM: MRI HEAD WITHOUT CONTRAST MRA HEAD WITHOUT CONTRAST TECHNIQUE: Multiplanar, multiecho pulse sequences of the brain and surrounding structures were obtained without intravenous contrast. Angiographic images of the head were obtained using MRA technique without contrast. COMPARISON:  Prior CT 07/15/2016. FINDINGS: MRI HEAD FINDINGS Mild diffuse prominence of the CSF containing spaces is compatible with generalized cerebral atrophy, within normal limits for patient age. Patchy and confluent T2/FLAIR hyperintensity within the periventricular and deep white matter both cerebral hemispheres most compatible  with chronic microvascular ischemic disease, moderate in nature. There are patchy multi vocal folds ladder of restricted diffusion involving the right PCA territory, involving the right occipital lobe and mesial right temporal lobe, with additional 0 punctate infarct within the right thalamus. Associated T2/FLAIR signal abnormality. ADC signal appears to be somewhat normalizing at this point, compatible with acute/early subacute infarct. No associated hemorrhage or mass effect. No other acute infarct. Gray-white matter differentiation otherwise maintained. Major intracranial vascular flow voids are grossly preserved. Left vertebral artery appears to be hypoplastic. Prominent atheromatous plaque at the right V4 segment. No mass lesion, midline shift, or mass effect. No hydrocephalus. No extra-axial fluid collection. Major dural sinuses are grossly patent. Craniocervical junction normal. Visualized upper cervical spine unremarkable. Pituitary gland within normal limits. No acute abnormality about the globes and orbits. Patient is status post lens extraction bilaterally. Paranasal sinuses are clear. Mild scattered opacity within the bilateral mastoid air cells, of doubtful clinical significance. Inner ear structures grossly normal. Bone marrow signal intensity within normal limits. No scalp soft tissue abnormality. MRA HEAD FINDINGS ANTERIOR CIRCULATION: Visualized distal cervical segments of the internal carotid arteries are widely patent with antegrade flow. Petrous, cavernous, and supraclinoid segments widely patent without flow-limiting stenosis. A1 segments patent bilaterally. The left A1 segment is hypoplastic. Anterior communicating artery normal. Short-segment severe stenosis left A2 segment present (series 5, image 64). Right ACA demonstrates mild irregularity without severe stenosis. M1 segments demonstrate mild atheromatous irregularity. Mild smooth narrowing of the distal M1 segments bilaterally without  focal high-grade stenosis. Distal small vessel atheromatous irregularity within the MCA branches bilaterally. POSTERIOR CIRCULATION: Right vertebral artery dominant and patent to the vertebrobasilar junction without focal high-grade stenosis. Hypoplastic left vertebral artery terminates in PICA. Posterior inferior cerebral arteries themselves are patent proximally. Basilar artery demonstrates mild multi focal atheromatous irregularity and stenoses. Superior cerebral arteries patent bilaterally. Basilar tip mildly ectatic without focal aneurysm. Left  P1 segment somewhat hypoplastic with a prominent left posterior communicating artery. Multifocal atheromatous irregularity with tandem severe nonocclusive stenoses within the left PCA (series 554, image 5). Left PCA is opacified to its distal aspect. There is fetal origin of the right PCA supplied via a patent right posterior communicating artery. Right PCA 8 attenuated proximally Ing and is not seen distally, suspected to be occluded given the right PCA infarcts. No aneurysm or vascular malformation. IMPRESSION: MRI HEAD IMPRESSION: 1. Acute/early subacute ischemic nonhemorrhagic right PCA territory infarcts. 2. Moderate chronic microvascular ischemic disease. MRA HEAD IMPRESSION: 1. Occlusion of the distal right PCA. Note is made of a fetal type right PCA. 2. Atheromatous irregularity with severe tandem nonocclusive the stenoses within the left PCA. 3. Severe nonocclusive left A2 stenosis. 4. Additional more mild atheromatous disease involving the anterior posterior circulation as above. Electronically Signed   By: Rise Mu M.D.   On: 06/16/2016 12:53   Dg Chest Port 1 View  Result Date: 06/15/2016 CLINICAL DATA:  ARDS, weakness, hypertension EXAM: PORTABLE CHEST 1 VIEW COMPARISON:  Portable exam 0636 hours compared to 06/14/2006 D FINDINGS: RIGHT jugular central venous catheter with tip projecting over SVC. Rotation to LEFT. Enlargement of cardiac  silhouette with pulmonary vascular congestion. RIGHT pleural effusion present. Atelectasis versus consolidation in BILATERAL lower lobes. No pneumothorax. Bones demineralized. IMPRESSION: Persistent bibasilar atelectasis versus consolidation with increased RIGHT pleural effusion. Enlargement of cardiac silhouette. Electronically Signed   By: Ulyses Southward M.D.   On: 06/15/2016 08:03       Assessment and plan discussed with with attending physician and they are in agreement.    Felicie Morn PA-C [facility] [phone]  06/16/2016, 3:28 PM   Assessment: 80 y.o. female with right PCA territory infarction in the setting of GI bleed. She has an occluded right PCA, and it is possible that this represents hypoperfusion(e.g. rapid fall in blood pressure) in the setting of the previously occluded vessel. It is also possible that the occlusion is acute, given the other disease I would suspect this actually represents thrombosis of a large artery atherosclerosis as opposed to embolus, though difficult to exclude embolus.  Stroke Risk Factors - hypertension  Echo with no embolic source  1) antiplatelet therapy would certainly be preferable when okay from GI perspective 2) lipid panel with goal LDL less than 70 3) A1c 4) speech cognitive eval 5) stroke team to follow  Ritta Slot, MD [facility] [phone]  If 7pm- 7am, please page neurology on call as listed in AMION.

## 2016-06-17 ENCOUNTER — Encounter (HOSPITAL_COMMUNITY): Payer: BC Managed Care – PPO

## 2016-06-17 ENCOUNTER — Inpatient Hospital Stay (HOSPITAL_COMMUNITY): Payer: BC Managed Care – PPO

## 2016-06-17 DIAGNOSIS — N390 Urinary tract infection, site not specified: Secondary | ICD-10-CM

## 2016-06-17 DIAGNOSIS — I639 Cerebral infarction, unspecified: Secondary | ICD-10-CM

## 2016-06-17 DIAGNOSIS — I63431 Cerebral infarction due to embolism of right posterior cerebral artery: Secondary | ICD-10-CM

## 2016-06-17 DIAGNOSIS — K2960 Other gastritis without bleeding: Secondary | ICD-10-CM

## 2016-06-17 DIAGNOSIS — K269 Duodenal ulcer, unspecified as acute or chronic, without hemorrhage or perforation: Secondary | ICD-10-CM

## 2016-06-17 DIAGNOSIS — K259 Gastric ulcer, unspecified as acute or chronic, without hemorrhage or perforation: Secondary | ICD-10-CM

## 2016-06-17 DIAGNOSIS — B9620 Unspecified Escherichia coli [E. coli] as the cause of diseases classified elsewhere: Secondary | ICD-10-CM

## 2016-06-17 DIAGNOSIS — R778 Other specified abnormalities of plasma proteins: Secondary | ICD-10-CM

## 2016-06-17 DIAGNOSIS — A4151 Sepsis due to Escherichia coli [E. coli]: Secondary | ICD-10-CM

## 2016-06-17 LAB — CBC
HEMATOCRIT: 39.7 % (ref 36.0–46.0)
HEMOGLOBIN: 13.1 g/dL (ref 12.0–15.0)
MCH: 30.4 pg (ref 26.0–34.0)
MCHC: 33 g/dL (ref 30.0–36.0)
MCV: 92.1 fL (ref 78.0–100.0)
Platelets: 246 10*3/uL (ref 150–400)
RBC: 4.31 MIL/uL (ref 3.87–5.11)
RDW: 15.2 % (ref 11.5–15.5)
WBC: 11.7 10*3/uL — AB (ref 4.0–10.5)

## 2016-06-17 LAB — LIPID PANEL
CHOLESTEROL: 200 mg/dL (ref 0–200)
HDL: 39 mg/dL — AB (ref >40)
LDL Cholesterol: 120 mg/dL — ABNORMAL HIGH (ref 0–99)
TRIGLYCERIDES: 203 mg/dL — AB (ref <150)
Total CHOL/HDL Ratio: 5.1 RATIO
VLDL: 41 mg/dL — ABNORMAL HIGH (ref 0–40)

## 2016-06-17 LAB — BASIC METABOLIC PANEL
ANION GAP: 10 (ref 5–15)
BUN: 19 mg/dL (ref 6–20)
CALCIUM: 9 mg/dL (ref 8.9–10.3)
CHLORIDE: 104 mmol/L (ref 101–111)
CO2: 29 mmol/L (ref 22–32)
CREATININE: 1.03 mg/dL — AB (ref 0.44–1.00)
GFR calc non Af Amer: 51 mL/min — ABNORMAL LOW (ref >60)
GFR, EST AFRICAN AMERICAN: 59 mL/min — AB (ref >60)
Glucose, Bld: 96 mg/dL (ref 65–99)
Potassium: 3.3 mmol/L — ABNORMAL LOW (ref 3.5–5.1)
SODIUM: 143 mmol/L (ref 135–145)

## 2016-06-17 MED ORDER — CEFTRIAXONE SODIUM 2 G IJ SOLR
2.0000 g | INTRAMUSCULAR | Status: DC
  Filled 2016-06-17: qty 2

## 2016-06-17 MED ORDER — SULFAMETHOXAZOLE-TRIMETHOPRIM 800-160 MG PO TABS
1.0000 | ORAL_TABLET | Freq: Two times a day (BID) | ORAL | Status: DC
  Filled 2016-06-17: qty 1

## 2016-06-17 MED ORDER — SIMVASTATIN 20 MG PO TABS
20.0000 mg | ORAL_TABLET | Freq: Every day | ORAL | Status: DC
  Administered 2016-06-17 – 2016-06-21 (×5): 20 mg via ORAL
  Filled 2016-06-17 (×5): qty 1

## 2016-06-17 MED ORDER — POTASSIUM CHLORIDE CRYS ER 20 MEQ PO TBCR
40.0000 meq | EXTENDED_RELEASE_TABLET | Freq: Once | ORAL | Status: AC
  Administered 2016-06-17: 40 meq via ORAL
  Filled 2016-06-17: qty 2

## 2016-06-17 MED ORDER — CEFAZOLIN IN D5W 1 GM/50ML IV SOLN
1.0000 g | Freq: Two times a day (BID) | INTRAVENOUS | Status: DC
Start: 2016-06-17 — End: 2016-06-17
  Filled 2016-06-17 (×2): qty 50

## 2016-06-17 MED ORDER — ASPIRIN EC 81 MG PO TBEC
81.0000 mg | DELAYED_RELEASE_TABLET | Freq: Every day | ORAL | Status: DC
  Administered 2016-06-17 – 2016-06-21 (×5): 81 mg via ORAL
  Filled 2016-06-17 (×5): qty 1

## 2016-06-17 MED ORDER — POLYETHYLENE GLYCOL 3350 17 G PO PACK: 17.0000 g | PACK | Freq: Every day | ORAL | Status: DC | PRN

## 2016-06-17 MED ORDER — DEXTROSE 5 % IV SOLN
2.0000 g | Freq: Two times a day (BID) | INTRAVENOUS | Status: DC
  Administered 2016-06-17 – 2016-06-21 (×9): 2 g via INTRAVENOUS
  Filled 2016-06-17 (×10): qty 2

## 2016-06-17 NOTE — Evaluation (Addendum)
Speech Language Pathology Evaluation Patient Details Name: Lashane Heldnn V Eimer MRN: [id number] DOB: 04-01-1936 Today's Date: 06/17/2016 Time: [id number] SLP Time Calculation (min) (ACUTE ONLY): 22 min  Problem List:  Patient Active Problem List   Diagnosis Date Noted  . E coli bacteremia   . Iron deficiency anemia due to chronic blood loss   . CAP (community acquired pneumonia) 06/13/2016  . Sepsis (HCC) 06/13/2016  . Nausea vomiting and diarrhea 06/13/2016  . Neutropenia (HCC) 06/13/2016  . Anxiety 06/13/2016  . UGIB (upper gastrointestinal bleed) 06/13/2016  . Hypokalemia 06/13/2016  . Acute renal failure superimposed on stage 3 chronic kidney disease (HCC)   . Essential hypertension   . Leukopenia   . AKI (acute kidney injury) (HCC)   . Dehydration   . Encounter for central line placement   . Difficult intravenous access    Past Medical History:  Past Medical History:  Diagnosis Date  . Blood transfusion without reported diagnosis   . CKD (chronic kidney disease), stage III   . Hypertension   . PONV (postoperative nausea and vomiting)    nausea  . Stroke (HCC) 06/2016  . Thyroid disease    Past Surgical History:  Past Surgical History:  Procedure Laterality Date  . ABDOMINAL HYSTERECTOMY    . CHOLECYSTECTOMY N/A 08/13/2015   Procedure: LAPAROSCOPIC CHOLECYSTECTOMY WITH INTRAOPERATIVE CHOLANGIOGRAM;  Surgeon: Avel Peaceodd Rosenbower, MD;  [facility];  Service: General;  Laterality: N/A;  . ESOPHAGOGASTRODUODENOSCOPY N/A 06/14/2016   Procedure: ESOPHAGOGASTRODUODENOSCOPY (EGD);  Surgeon: Jeani HawkingPatrick Hung, MD;  [facility];  Service: Endoscopy;  Laterality: N/A;  . THYROIDECTOMY, PARTIAL     HPI:  80 y.o.femaleadmitted with UGIB and GNR bacteremia, acute cognitive changes.  MRI revealed right PCA territory infarction in the setting of GI bleed.   Pt has been tolerating a regular diet - no swallow eval warranted.  Assessment / Plan / Recommendation Clinical  Impression  Pt presents with baseline cognitive-linguistic function.  Articulation is clear; language intact.  No further confusion.  Higher-level cogntive assessment revealed normal insight, attention, problem solving and recall.  Pt/daughter agree that pt is at baseline - no SLP f/u will be warranted.      SLP Assessment  Patient does not need any further Speech Lanaguage Pathology Services    Follow Up Recommendations       Frequency and Duration           SLP Evaluation Cognition  Overall Cognitive Status: Within Functional Limits for tasks assessed Arousal/Alertness: Awake/alert Orientation Level: Oriented X4 Attention: Selective Selective Attention: Appears intact Memory: Appears intact Awareness: Appears intact Problem Solving: Appears intact Executive Function: Reasoning Reasoning: Appears intact Safety/Judgment: Appears intact    SLP Diagnosis   Leukopenia  CKD (chronic kidney disease), stage III  Essential hypertension  Dehydration  Nausea vomiting and diarrhea  Other iron deficiency anemias  CAP (community acquired pneumonia)  UTI (lower urinary tract infection)  AKI (acute kidney injury) (HCC) - Plan: US Renal, US Renal  Respiratory failure (HCC) - Plan: DG Chest Port 1 LeveringView, DG Chest Port 1 View  Encounter for central line placement - Plan: DG Chest Port 1 View, DG Chest Port 1 View  ARDS (adult respiratory distress syndrome) (HCC) - Plan: DG Chest Port 1 View, DG Chest Port 1 View  Confusion  Cough - Plan: DG Chest 2 View, DG Chest 2 View    Comprehension  Auditory Comprehension Overall Auditory Comprehension: Appears within functional limits for tasks assessed Visual Recognition/Discrimination Discrimination: Within Function  Limits Reading Comprehension Reading Status: Within funtional limits    Expression Expression Primary Mode of Expression: Verbal Verbal Expression Overall Verbal Expression: Appears within functional limits for  tasks assessed Written Expression Written Expression: Not tested   Oral / Motor  Oral Motor/Sensory Function Overall Oral Motor/Sensory Function: Within functional limits Motor Speech Overall Motor Speech: Appears within functional limits for tasks assessed   GO                    Blenda Mounts Laurice 06/17/2016, 12:27 PM

## 2016-06-17 NOTE — Consult Note (Addendum)
[facility]  Date of Admission:  06/13/2016  Date of Consult:  06/17/2016  Reason for Consult: Bacteremia Referring Physician: Erlinda Hong  Impression/Recommendation Bacteremia UTI + Troponins  NSAID association gastritis, duodenal and gastric ulcers CVA (thrombosis vs embolus)  Would Repeat her BCx Continue ceftriaxone, increase frequency to q12 Consider TEE  Comment I discussed with family and pt. She wishes to proceede with TEE.   Thank you so much for this interesting consult,   Bobby Rumpf (pager) [phone] [email or web address]  [name] is an 80 y.o. female.  HPI: 80 yo F with hx of CKD III, adm on 9-11 with n/v for 48h (1-2x/day) and 3-4 loose BM/day.  There some question of hematemesis at home (she was taking NSAIDS, goody's). She was found to have a Hgb drop of 5.4. She was also noted to have a temp of 101.3.  She was started on vanco/aztreonam/levaquin in ED, levaquin continued after admission. She was then changed to imipenem/vanco. Her BCx and UCx grew E coli (pan-sens).  She underwent EGD She was noted to have leukocytosis which was attributed to steroids she received in the hospital.  On hospital, she had mental status change, this was initially attributed to her sedation meds from her EGD. However her MRI showed a R pca acute-subacute CVA.    Past Medical History:  Diagnosis Date  . Blood transfusion without reported diagnosis   . CKD (chronic kidney disease), stage III   . Hypertension   . PONV (postoperative nausea and vomiting)    nausea  . Stroke ([place]) 06/2016  . Thyroid disease     Past Surgical History:  Procedure Laterality Date  . ABDOMINAL HYSTERECTOMY    . CHOLECYSTECTOMY N/A 08/13/2015   Procedure: LAPAROSCOPIC CHOLECYSTECTOMY WITH INTRAOPERATIVE CHOLANGIOGRAM;  Surgeon: Jackolyn Confer, MD;  Location: [facility];  Service: General;  Laterality: N/A;  . ESOPHAGOGASTRODUODENOSCOPY N/A 06/14/2016   Procedure:  ESOPHAGOGASTRODUODENOSCOPY (EGD);  Surgeon: Carol Ada, MD;  Location: [facility] ENDOSCOPY;  Service: Endoscopy;  Laterality: N/A;  . THYROIDECTOMY, PARTIAL       Allergies  Allergen Reactions  . Amoxicillin Hives and Itching    Has patient had a PCN reaction causing immediate rash, facial/tongue/throat swelling, SOB or lightheadedness with hypotension: Yes Has patient had a PCN reaction causing severe rash involving mucus membranes or skin necrosis: Yes Has patient had a PCN reaction that required hospitalization No Has patient had a PCN reaction occurring within the last 10 years: Yes If all of the above answers are "NO", then may proceed with Cephalosporin use.     Medications:  Scheduled: . amLODipine  2.5 mg Oral Daily  . aspirin EC  81 mg Oral Daily  .  ceFAZolin (ANCEF) IV  1 g Intravenous Q12H  . metoprolol tartrate  25 mg Oral BID  . omega-3 acid ethyl esters  1 g Oral Daily  . pantoprazole  40 mg Intravenous Q12H  . simvastatin  20 mg Oral q1800  . sodium chloride  2,000 mL Intravenous Once    Abtx:  Anti-infectives    Start     Dose/Rate Route Frequency Ordered Stop   06/17/16 1200  cefTRIAXone (ROCEPHIN) 2 g in dextrose 5 % 50 mL IVPB     2 g 100 mL/hr over 30 Minutes Intravenous Every 24 hours 06/17/16 1140     06/17/16 1000  sulfamethoxazole-trimethoprim (BACTRIM DS,SEPTRA DS) 800-160 MG per tablet 1 tablet  Status:  Discontinued     1 tablet Oral  Every 12 hours 06/17/16 0900 06/17/16 1140   06/16/16 1000  imipenem-cilastatin (PRIMAXIN) 500 mg in sodium chloride 0.9 % 100 mL IVPB  Status:  Discontinued     500 mg 200 mL/hr over 30 Minutes Intravenous Every 8 hours 06/16/16 0931 06/17/16 0859   06/15/16 0200  levofloxacin (LEVAQUIN) IVPB 500 mg  Status:  Discontinued     500 mg 100 mL/hr over 60 Minutes Intravenous Every 48 hours 06/13/16 0558 06/13/16 1343   06/14/16 0300  vancomycin (VANCOCIN) 500 mg in sodium chloride 0.9 % 100 mL IVPB  Status:  Discontinued      500 mg 100 mL/hr over 60 Minutes Intravenous Every 24 hours 06/13/16 1344 06/14/16 1007   06/13/16 1400  imipenem-cilastatin (PRIMAXIN) 500 mg in sodium chloride 0.9 % 100 mL IVPB  Status:  Discontinued     500 mg 200 mL/hr over 30 Minutes Intravenous Every 12 hours 06/13/16 1344 06/16/16 0931   06/13/16 0145  levofloxacin (LEVAQUIN) IVPB 750 mg     750 mg 100 mL/hr over 90 Minutes Intravenous  Once 06/13/16 0143 06/13/16 0334   06/13/16 0145  aztreonam (AZACTAM) 2 g in dextrose 5 % 50 mL IVPB     2 g 100 mL/hr over 30 Minutes Intravenous  Once 06/13/16 0143 06/13/16 0242   06/13/16 0145  vancomycin (VANCOCIN) IVPB 1000 mg/200 mL premix     1,000 mg 200 mL/hr over 60 Minutes Intravenous  Once 06/13/16 0143 06/13/16 0335      Total days of antibiotics: 4          Social History:  reports that she has never smoked. She has never used smokeless tobacco. She reports that she does not drink alcohol or use drugs.  Family History  Problem Relation Age of Onset  . Stroke Mother     General ROS: no dysuria, no cough, no SOB, normal BM, continued nausea, no fevers, see HPI.   Blood pressure (!) 151/67, pulse 77, temperature 98.2 F (36.8 C), temperature source Oral, resp. rate 16, height 5' (1.524 m), weight 48.9 kg (107 lb 11.2 oz), SpO2 91 %. General appearance: alert, cooperative and no distress Eyes: negative findings: conjunctivae and sclerae normal and pupils equal, round, reactive to light and accomodation Throat: normal findings: oropharynx pink & moist without lesions or evidence of thrush Neck: no adenopathy and supple, symmetrical, trachea midline Lungs: clear to auscultation bilaterally Heart: regular rate and rhythm Abdomen: normal findings: bowel sounds normal and soft, non-tender Extremities: edema none   Results for orders placed or performed during the hospital encounter of 06/13/16 (from the past 48 hour(s))  Troponin I (q 6hr x 3)     Status: Abnormal   Collection  Time: 06/15/16  3:24 PM  Result Value Ref Range   Troponin I 0.53 (HH) <0.03 ng/mL    Comment: CRITICAL VALUE NOTED.  VALUE IS CONSISTENT WITH PREVIOUSLY REPORTED AND CALLED VALUE.  Troponin I (q 6hr x 3)     Status: Abnormal   Collection Time: 06/15/16  9:47 PM  Result Value Ref Range   Troponin I 0.40 (HH) <0.03 ng/mL    Comment: CRITICAL VALUE NOTED.  VALUE IS CONSISTENT WITH PREVIOUSLY REPORTED AND CALLED VALUE.  Basic metabolic panel     Status: Abnormal   Collection Time: 06/16/16  4:37 AM  Result Value Ref Range   Sodium 145 135 - 145 mmol/L   Potassium 3.8 3.5 - 5.1 mmol/L   Chloride 111 101 - 111 mmol/L     CO2 27 22 - 32 mmol/L   Glucose, Bld 131 (H) 65 - 99 mg/dL   BUN 21 (H) 6 - 20 mg/dL   Creatinine, Ser 1.09 (H) 0.44 - 1.00 mg/dL   Calcium 9.1 8.9 - 10.3 mg/dL   GFR calc non Af Amer 47 (L) >60 mL/min   GFR calc Af Amer 55 (L) >60 mL/min    Comment: (NOTE) The eGFR has been calculated using the CKD EPI equation. This calculation has not been validated in all clinical situations. eGFR's persistently <60 mL/min signify possible Chronic Kidney Disease.    Anion gap 7 5 - 15  CBC with Differential/Platelet     Status: Abnormal   Collection Time: 06/16/16  4:37 AM  Result Value Ref Range   WBC 18.1 (H) 4.0 - 10.5 K/uL   RBC 4.05 3.87 - 5.11 MIL/uL   Hemoglobin 12.5 12.0 - 15.0 g/dL   HCT 37.5 36.0 - 46.0 %   MCV 92.6 78.0 - 100.0 fL   MCH 30.9 26.0 - 34.0 pg   MCHC 33.3 30.0 - 36.0 g/dL   RDW 15.5 11.5 - 15.5 %   Platelets 265 150 - 400 K/uL   Neutrophils Relative % 90 %   Neutro Abs 16.4 (H) 1.7 - 7.7 K/uL   Lymphocytes Relative 6 %   Lymphs Abs 1.0 0.7 - 4.0 K/uL   Monocytes Relative 4 %   Monocytes Absolute 0.6 0.1 - 1.0 K/uL   Eosinophils Relative 0 %   Eosinophils Absolute 0.0 0.0 - 0.7 K/uL   Basophils Relative 0 %   Basophils Absolute 0.0 0.0 - 0.1 K/uL  CBC     Status: Abnormal   Collection Time: 06/17/16  3:40 AM  Result Value Ref Range   WBC 11.7  (H) 4.0 - 10.5 K/uL   RBC 4.31 3.87 - 5.11 MIL/uL   Hemoglobin 13.1 12.0 - 15.0 g/dL   HCT 39.7 36.0 - 46.0 %   MCV 92.1 78.0 - 100.0 fL   MCH 30.4 26.0 - 34.0 pg   MCHC 33.0 30.0 - 36.0 g/dL   RDW 15.2 11.5 - 15.5 %   Platelets 246 150 - 400 K/uL  Basic metabolic panel     Status: Abnormal   Collection Time: 06/17/16  3:40 AM  Result Value Ref Range   Sodium 143 135 - 145 mmol/L   Potassium 3.3 (L) 3.5 - 5.1 mmol/L   Chloride 104 101 - 111 mmol/L   CO2 29 22 - 32 mmol/L   Glucose, Bld 96 65 - 99 mg/dL   BUN 19 6 - 20 mg/dL   Creatinine, Ser 1.03 (H) 0.44 - 1.00 mg/dL   Calcium 9.0 8.9 - 10.3 mg/dL   GFR calc non Af Amer 51 (L) >60 mL/min   GFR calc Af Amer 59 (L) >60 mL/min    Comment: (NOTE) The eGFR has been calculated using the CKD EPI equation. This calculation has not been validated in all clinical situations. eGFR's persistently <60 mL/min signify possible Chronic Kidney Disease.    Anion gap 10 5 - 15  Lipid panel     Status: Abnormal   Collection Time: 06/17/16  3:40 AM  Result Value Ref Range   Cholesterol 200 0 - 200 mg/dL   Triglycerides 203 (H) <150 mg/dL   HDL 39 (L) >40 mg/dL   Total CHOL/HDL Ratio 5.1 RATIO   VLDL 41 (H) 0 - 40 mg/dL   LDL Cholesterol 120 (H) 0 - 99 mg/dL      Comment:        Total Cholesterol/HDL:CHD Risk Coronary Heart Disease Risk Table                     Men   Women  1/2 Average Risk   3.4   3.3  Average Risk       5.0   4.4  2 X Average Risk   9.6   7.1  3 X Average Risk  23.4   11.0        Use the calculated Patient Ratio above and the CHD Risk Table to determine the patient's CHD Risk.        ATP III CLASSIFICATION (LDL):  <100     mg/dL   Optimal  100-129  mg/dL   Near or Above                    Optimal  130-159  mg/dL   Borderline  160-189  mg/dL   High  >190     mg/dL   Very High       Component Value Date/Time   SDES BLOOD LEFT WRIST 06/13/2016 0215   SPECREQUEST IN PEDIATRIC BOTTLE 4ML 06/13/2016 0215   CULT  (A) 06/13/2016 0215    ESCHERICHIA COLI SUSCEPTIBILITIES PERFORMED ON PREVIOUS CULTURE WITHIN THE LAST 5 DAYS.    REPTSTATUS 06/15/2016 FINAL 06/13/2016 0215   Dg Chest 2 View  Result Date: 06/17/2016 CLINICAL DATA:  Followup pleural effusion. EXAM: CHEST  2 VIEW COMPARISON:  06/15/2016 FINDINGS: Normal heart size. Bilateral pleural effusions are identified which appear stable to decreased in volume from previous exam. Interval improvement in pulmonary edema. IMPRESSION: Improving pulmonary edema with stable to decrease in volume of pleural effusions. Electronically Signed   By: Kerby Moors M.D.   On: 06/17/2016 09:04   Ct Head Wo Contrast  Result Date: 06/15/2016 CLINICAL DATA:  80 year old female with sudden onset of confusion this morning. No associated head pain. EXAM: CT HEAD WITHOUT CONTRAST TECHNIQUE: Contiguous axial images were obtained from the base of the skull through the vertex without intravenous contrast. COMPARISON:  Head CT 12/08/2009. FINDINGS: Brain: Mild cerebral atrophy. Patchy and confluent areas of decreased attenuation are noted throughout the deep and periventricular white matter of the cerebral hemispheres bilaterally, compatible with chronic microvascular ischemic disease. No evidence of acute infarction, hemorrhage, hydrocephalus, extra-axial collection or mass lesion/mass effect. Vascular: No hyperdense vessel. Calcified atherosclerotic plaque in the right vertebral artery (image 3 of series 2), new compared to the prior examination. Based on the appearance, there may be a hemodynamically significant stenosis in this vessel. Skull: Normal. Negative for fracture or focal lesion. Sinuses/Orbits: No acute finding. Other: None. IMPRESSION: 1. No definite acute intracranial abnormalities. 2. There is a new densely calcified plaque in the right vertebral artery (as above) compared to prior study from 12/08/2009. Although not assessable on today's noncontrast CT examination, the  appearance could suggest a hemodynamically significant stenosis. If there are signs or symptoms of vertebrobasilar insufficiency, further evaluation with noncontrast brain MRI/MRA could be considered. 3. Mild cerebral atrophy with mild chronic microvascular ischemic changes in the cerebral white matter, as above. Electronically Signed   By: Vinnie Langton M.D.   On: 06/15/2016 12:23   Mr Jodene Nam Head Wo Contrast  Result Date: 06/16/2016 CLINICAL DATA:  Initial valuation for delirium. Evaluate for stroke. EXAM: MRI HEAD WITHOUT CONTRAST MRA HEAD WITHOUT CONTRAST TECHNIQUE: Multiplanar, multiecho pulse sequences of the brain and surrounding structures were  obtained without intravenous contrast. Angiographic images of the head were obtained using MRA technique without contrast. COMPARISON:  Prior CT 07/15/2016. FINDINGS: MRI HEAD FINDINGS Mild diffuse prominence of the CSF containing spaces is compatible with generalized cerebral atrophy, within normal limits for patient age. Patchy and confluent T2/FLAIR hyperintensity within the periventricular and deep white matter both cerebral hemispheres most compatible with chronic microvascular ischemic disease, moderate in nature. There are patchy multi vocal folds ladder of restricted diffusion involving the right PCA territory, involving the right occipital lobe and mesial right temporal lobe, with additional 0 punctate infarct within the right thalamus. Associated T2/FLAIR signal abnormality. ADC signal appears to be somewhat normalizing at this point, compatible with acute/early subacute infarct. No associated hemorrhage or mass effect. No other acute infarct. Gray-white matter differentiation otherwise maintained. Major intracranial vascular flow voids are grossly preserved. Left vertebral artery appears to be hypoplastic. Prominent atheromatous plaque at the right V4 segment. No mass lesion, midline shift, or mass effect. No hydrocephalus. No extra-axial fluid  collection. Major dural sinuses are grossly patent. Craniocervical junction normal. Visualized upper cervical spine unremarkable. Pituitary gland within normal limits. No acute abnormality about the globes and orbits. Patient is status post lens extraction bilaterally. Paranasal sinuses are clear. Mild scattered opacity within the bilateral mastoid air cells, of doubtful clinical significance. Inner ear structures grossly normal. Bone marrow signal intensity within normal limits. No scalp soft tissue abnormality. MRA HEAD FINDINGS ANTERIOR CIRCULATION: Visualized distal cervical segments of the internal carotid arteries are widely patent with antegrade flow. Petrous, cavernous, and supraclinoid segments widely patent without flow-limiting stenosis. A1 segments patent bilaterally. The left A1 segment is hypoplastic. Anterior communicating artery normal. Short-segment severe stenosis left A2 segment present (series 5, image 64). Right ACA demonstrates mild irregularity without severe stenosis. M1 segments demonstrate mild atheromatous irregularity. Mild smooth narrowing of the distal M1 segments bilaterally without focal high-grade stenosis. Distal small vessel atheromatous irregularity within the MCA branches bilaterally. POSTERIOR CIRCULATION: Right vertebral artery dominant and patent to the vertebrobasilar junction without focal high-grade stenosis. Hypoplastic left vertebral artery terminates in PICA. Posterior inferior cerebral arteries themselves are patent proximally. Basilar artery demonstrates mild multi focal atheromatous irregularity and stenoses. Superior cerebral arteries patent bilaterally. Basilar tip mildly ectatic without focal aneurysm. Left P1 segment somewhat hypoplastic with a prominent left posterior communicating artery. Multifocal atheromatous irregularity with tandem severe nonocclusive stenoses within the left PCA (series 554, image 5). Left PCA is opacified to its distal aspect. There is  fetal origin of the right PCA supplied via a patent right posterior communicating artery. Right PCA 8 attenuated proximally Ing and is not seen distally, suspected to be occluded given the right PCA infarcts. No aneurysm or vascular malformation. IMPRESSION: MRI HEAD IMPRESSION: 1. Acute/early subacute ischemic nonhemorrhagic right PCA territory infarcts. 2. Moderate chronic microvascular ischemic disease. MRA HEAD IMPRESSION: 1. Occlusion of the distal right PCA. Note is made of a fetal type right PCA. 2. Atheromatous irregularity with severe tandem nonocclusive the stenoses within the left PCA. 3. Severe nonocclusive left A2 stenosis. 4. Additional more mild atheromatous disease involving the anterior posterior circulation as above. Electronically Signed   By: Jeannine Boga M.D.   On: 06/16/2016 12:53   Mr Brain Wo Contrast  Result Date: 06/16/2016 CLINICAL DATA:  Initial valuation for delirium. Evaluate for stroke. EXAM: MRI HEAD WITHOUT CONTRAST MRA HEAD WITHOUT CONTRAST TECHNIQUE: Multiplanar, multiecho pulse sequences of the brain and surrounding structures were obtained without intravenous contrast. Angiographic images of the  head were obtained using MRA technique without contrast. COMPARISON:  Prior CT 07/15/2016. FINDINGS: MRI HEAD FINDINGS Mild diffuse prominence of the CSF containing spaces is compatible with generalized cerebral atrophy, within normal limits for patient age. Patchy and confluent T2/FLAIR hyperintensity within the periventricular and deep white matter both cerebral hemispheres most compatible with chronic microvascular ischemic disease, moderate in nature. There are patchy multi vocal folds ladder of restricted diffusion involving the right PCA territory, involving the right occipital lobe and mesial right temporal lobe, with additional 0 punctate infarct within the right thalamus. Associated T2/FLAIR signal abnormality. ADC signal appears to be somewhat normalizing at this  point, compatible with acute/early subacute infarct. No associated hemorrhage or mass effect. No other acute infarct. Gray-white matter differentiation otherwise maintained. Major intracranial vascular flow voids are grossly preserved. Left vertebral artery appears to be hypoplastic. Prominent atheromatous plaque at the right V4 segment. No mass lesion, midline shift, or mass effect. No hydrocephalus. No extra-axial fluid collection. Major dural sinuses are grossly patent. Craniocervical junction normal. Visualized upper cervical spine unremarkable. Pituitary gland within normal limits. No acute abnormality about the globes and orbits. Patient is status post lens extraction bilaterally. Paranasal sinuses are clear. Mild scattered opacity within the bilateral mastoid air cells, of doubtful clinical significance. Inner ear structures grossly normal. Bone marrow signal intensity within normal limits. No scalp soft tissue abnormality. MRA HEAD FINDINGS ANTERIOR CIRCULATION: Visualized distal cervical segments of the internal carotid arteries are widely patent with antegrade flow. Petrous, cavernous, and supraclinoid segments widely patent without flow-limiting stenosis. A1 segments patent bilaterally. The left A1 segment is hypoplastic. Anterior communicating artery normal. Short-segment severe stenosis left A2 segment present (series 5, image 64). Right ACA demonstrates mild irregularity without severe stenosis. M1 segments demonstrate mild atheromatous irregularity. Mild smooth narrowing of the distal M1 segments bilaterally without focal high-grade stenosis. Distal small vessel atheromatous irregularity within the MCA branches bilaterally. POSTERIOR CIRCULATION: Right vertebral artery dominant and patent to the vertebrobasilar junction without focal high-grade stenosis. Hypoplastic left vertebral artery terminates in PICA. Posterior inferior cerebral arteries themselves are patent proximally. Basilar artery  demonstrates mild multi focal atheromatous irregularity and stenoses. Superior cerebral arteries patent bilaterally. Basilar tip mildly ectatic without focal aneurysm. Left P1 segment somewhat hypoplastic with a prominent left posterior communicating artery. Multifocal atheromatous irregularity with tandem severe nonocclusive stenoses within the left PCA (series 554, image 5). Left PCA is opacified to its distal aspect. There is fetal origin of the right PCA supplied via a patent right posterior communicating artery. Right PCA 8 attenuated proximally Ing and is not seen distally, suspected to be occluded given the right PCA infarcts. No aneurysm or vascular malformation. IMPRESSION: MRI HEAD IMPRESSION: 1. Acute/early subacute ischemic nonhemorrhagic right PCA territory infarcts. 2. Moderate chronic microvascular ischemic disease. MRA HEAD IMPRESSION: 1. Occlusion of the distal right PCA. Note is made of a fetal type right PCA. 2. Atheromatous irregularity with severe tandem nonocclusive the stenoses within the left PCA. 3. Severe nonocclusive left A2 stenosis. 4. Additional more mild atheromatous disease involving the anterior posterior circulation as above. Electronically Signed   By: Jeannine Boga M.D.   On: 06/16/2016 12:53   Recent Results (from the past 240 hour(s))  Blood Culture (routine x 2)     Status: Abnormal   Collection Time: 06/13/16  1:28 AM  Result Value Ref Range Status   Specimen Description BLOOD LEFT ARM  Final   Special Requests BOTTLES DRAWN AEROBIC AND ANAEROBIC 5ML  Final  Culture  Setup Time   Final    GRAM NEGATIVE RODS IN BOTH AEROBIC AND ANAEROBIC BOTTLES CRITICAL RESULT CALLED TO, READ BACK BY AND VERIFIED WITH: T.EGAN, PHARMD AT1720 ON 06/13/16 BY C. JESSUP, MLT.    Culture (A)  Final    ESCHERICHIA COLI SUSCEPTIBILITIES PERFORMED ON PREVIOUS CULTURE WITHIN THE LAST 5 DAYS.    Report Status 06/16/2016 FINAL  Final   Organism ID, Bacteria ESCHERICHIA COLI  Final       Susceptibility   Escherichia coli - MIC*    AMPICILLIN <=2 SENSITIVE Sensitive     CEFAZOLIN <=4 SENSITIVE Sensitive     CEFEPIME <=1 SENSITIVE Sensitive     CEFTAZIDIME <=1 SENSITIVE Sensitive     CEFTRIAXONE <=1 SENSITIVE Sensitive     CIPROFLOXACIN <=0.25 SENSITIVE Sensitive     GENTAMICIN <=1 SENSITIVE Sensitive     IMIPENEM <=0.25 SENSITIVE Sensitive     TRIMETH/SULFA <=20 SENSITIVE Sensitive     AMPICILLIN/SULBACTAM <=2 SENSITIVE Sensitive     PIP/TAZO <=4 SENSITIVE Sensitive     Extended ESBL NEGATIVE Sensitive     * ESCHERICHIA COLI  Blood Culture ID Panel (Reflexed)     Status: Abnormal   Collection Time: 06/13/16  1:28 AM  Result Value Ref Range Status   Enterococcus species NOT DETECTED NOT DETECTED Final   Listeria monocytogenes NOT DETECTED NOT DETECTED Final   Staphylococcus species NOT DETECTED NOT DETECTED Final   Staphylococcus aureus NOT DETECTED NOT DETECTED Final   Streptococcus species NOT DETECTED NOT DETECTED Final   Streptococcus agalactiae NOT DETECTED NOT DETECTED Final   Streptococcus pneumoniae NOT DETECTED NOT DETECTED Final   Streptococcus pyogenes NOT DETECTED NOT DETECTED Final   Acinetobacter baumannii NOT DETECTED NOT DETECTED Final   Enterobacteriaceae species DETECTED (A) NOT DETECTED Final    Comment: CRITICAL RESULT CALLED TO, READ BACK BY AND VERIFIED WITH: T. EGAN, PHARMD AT 1720 ON 06/13/16 BY C. JESSUP, MLT.    Enterobacter cloacae complex NOT DETECTED NOT DETECTED Final   Escherichia coli DETECTED (A) NOT DETECTED Final    Comment: CRITICAL RESULT CALLED TO, READ BACK BY AND VERIFIED WITH: T. EGAN, PHARMD AT 1720 ON 06/13/16 BY C. JESSUP, MLT.    Klebsiella oxytoca NOT DETECTED NOT DETECTED Final   Klebsiella pneumoniae NOT DETECTED NOT DETECTED Final   Proteus species NOT DETECTED NOT DETECTED Final   Serratia marcescens NOT DETECTED NOT DETECTED Final   Carbapenem resistance NOT DETECTED NOT DETECTED Final   Haemophilus  influenzae NOT DETECTED NOT DETECTED Final   Neisseria meningitidis NOT DETECTED NOT DETECTED Final   Pseudomonas aeruginosa NOT DETECTED NOT DETECTED Final   Candida albicans NOT DETECTED NOT DETECTED Final   Candida glabrata NOT DETECTED NOT DETECTED Final   Candida krusei NOT DETECTED NOT DETECTED Final   Candida parapsilosis NOT DETECTED NOT DETECTED Final   Candida tropicalis NOT DETECTED NOT DETECTED Final  Urine culture     Status: Abnormal   Collection Time: 06/13/16  1:55 AM  Result Value Ref Range Status   Specimen Description URINE, RANDOM  Final   Special Requests NONE  Final   Culture >=100,000 COLONIES/mL ESCHERICHIA COLI (A)  Final   Report Status 06/15/2016 FINAL  Final   Organism ID, Bacteria ESCHERICHIA COLI (A)  Final      Susceptibility   Escherichia coli - MIC*    AMPICILLIN <=2 SENSITIVE Sensitive     CEFAZOLIN <=4 SENSITIVE Sensitive     CEFTRIAXONE <=1 SENSITIVE Sensitive       CIPROFLOXACIN <=0.25 SENSITIVE Sensitive     GENTAMICIN <=1 SENSITIVE Sensitive     IMIPENEM <=0.25 SENSITIVE Sensitive     NITROFURANTOIN <=16 SENSITIVE Sensitive     TRIMETH/SULFA <=20 SENSITIVE Sensitive     AMPICILLIN/SULBACTAM <=2 SENSITIVE Sensitive     PIP/TAZO <=4 SENSITIVE Sensitive     Extended ESBL NEGATIVE Sensitive     * >=100,000 COLONIES/mL ESCHERICHIA COLI  Blood Culture (routine x 2)     Status: Abnormal   Collection Time: 06/13/16  2:15 AM  Result Value Ref Range Status   Specimen Description BLOOD LEFT WRIST  Final   Special Requests IN PEDIATRIC BOTTLE 4ML  Final   Culture  Setup Time   Final    GRAM NEGATIVE RODS AEROBIC BOTTLE ONLY CRITICAL RESULT CALLED TO, READ BACK BY AND VERIFIED WITH: T. EGAN, PHARMD AT 1720 ON 06/13/16 BY C. JESSUP, MLT.    Culture (A)  Final    ESCHERICHIA COLI SUSCEPTIBILITIES PERFORMED ON PREVIOUS CULTURE WITHIN THE LAST 5 DAYS.    Report Status 06/15/2016 FINAL  Final  Respiratory Panel by PCR     Status: None   Collection Time:  06/13/16  9:19 AM  Result Value Ref Range Status   Adenovirus NOT DETECTED NOT DETECTED Final   Coronavirus 229E NOT DETECTED NOT DETECTED Final   Coronavirus HKU1 NOT DETECTED NOT DETECTED Final   Coronavirus NL63 NOT DETECTED NOT DETECTED Final   Coronavirus OC43 NOT DETECTED NOT DETECTED Final   Metapneumovirus NOT DETECTED NOT DETECTED Final   Rhinovirus / Enterovirus NOT DETECTED NOT DETECTED Final   Influenza A NOT DETECTED NOT DETECTED Final   Influenza B NOT DETECTED NOT DETECTED Final   Parainfluenza Virus 1 NOT DETECTED NOT DETECTED Final   Parainfluenza Virus 2 NOT DETECTED NOT DETECTED Final   Parainfluenza Virus 3 NOT DETECTED NOT DETECTED Final   Parainfluenza Virus 4 NOT DETECTED NOT DETECTED Final   Respiratory Syncytial Virus NOT DETECTED NOT DETECTED Final   Bordetella pertussis NOT DETECTED NOT DETECTED Final   Chlamydophila pneumoniae NOT DETECTED NOT DETECTED Final   Mycoplasma pneumoniae NOT DETECTED NOT DETECTED Final      06/17/2016, 12:03 PM     LOS: 4 days    Records and images were personally reviewed where available.  

## 2016-06-17 NOTE — Progress Notes (Signed)
PROGRESS NOTE    AHMIYA ABEE  UJW:[id number] DOB: Sep 01, 1936 DOA: 06/13/2016 PCP: Pearson Grippe, MD    Brief Narrative: HISTORY OF PRESENT ILLNESS:   80 y.o. female admitted 9/11 with presumed UGIB as well as shock w/ GNR (ECOLI) bacteremia. EGD showed findings c/w NSAID (9/12) related gastritis. I suspect that the bacteremia was d/t bacterial translocation from gut vs UT source. Will dc vanc, cont imipenem until sensitivities are available. She does have some delirium s/p EGD from meds but this is rapidly clearing. For today: adv diet, cont PPI, dc vanc dc NSAIDs, & cont primaxin. Will give lasix x 1 for what is likely ALI and follow CXR, Will order ECHO to assess for wall motion abnormality although suspect that trop bump is simply r/t sepsis. She will move to tele this afternoon if stays stable.   Assessment & Plan:   Principal Problem:   Sepsis (HCC) Active Problems:   Acute renal failure superimposed on stage 3 chronic kidney disease (HCC)   Essential hypertension   CAP (community acquired pneumonia)   Nausea vomiting and diarrhea   Neutropenia (HCC)   Anxiety   UGIB (upper gastrointestinal bleed)   Hypokalemia   Leukopenia   AKI (acute kidney injury) (HCC)   Dehydration   Encounter for central line placement   Difficult intravenous access   E coli bacteremia   Iron deficiency anemia due to chronic blood loss  DISCUSSION: 80 y.o. female admitted 9/11 with presumed UGIB as well as shock w/ GNR (ECOLI) bacteremia. EGD showed findings c/w NSAID (9/12) related gastritis. I suspect that the bacteremia was d/t bacterial translocation from gut vs UT source. Will dc vanc, cont imipenem until sensitivities are available. She does have some delirium s/p EGD from meds but this is rapidly clearing. For today: adv diet, cont PPI, dc vanc dc NSAIDs, & cont primaxin. Will give lasix x 1 for what is likely ALI and follow CXR, Will order ECHO to assess for wall motion abnormality although suspect  that trop bump is simply r/t sepsis. She will move to tele this afternoon if stays stable.    Hemorrhagic and septic shock-->resolved.  Hx HTN.  Mild troponin bump in setting of sepsis, trending down, ECHO with Normal EF Goal MAP > 65.  E coli.  bacteremia -->ecoli almost certainly from gut translocation: wbc rising (likely from steroids) PCT rising but should level out and start to drop soon.  Cont PCT algo  Dc vanc. Continue with ceftriaxone, await ID recommendation.   PNA, pleural effusion;  IV lasix today. Repeat chest x ray 9-15.  WBC trending down, pleural effusion decreasing.  Change antibiotics to ceftriaxone.   Acute delirium-->d/t sedating meds for EGD Cont supportive care  Frequent re-orientation  Avoid IV benzos.   Acute stroke;  MRI with acute-subacute stroke, occlusion of Right PCA.  Neurology consulted, recommending ID consultation, does patient needs treatment for endocarditis due to bacteremia ? Should we proceed with TEE to rule out endocarditis.  Discussed with Dr [name], ok to use baby aspirin. If we are thinking Endocarditis will need to discontinue aspirin.   HTN resume lower dose Norvasc.   Acute blood loss anemia - from UGIB (NSAID related gastritis) Endoscopy: Normal esophagus.- Non-bleeding gastric ulcers with no stigmata of bleeding. Biopsied. - Multiple non-bleeding duodenal ulcers with no stigmata of bleeding. Cont daily cbc SCD's only. Dc asa NO NSAIDS F/u post-surgical path continue with protonix.    AKI-->improved Left renal cyst-->no hydro.  NAG metabolic acidosis  in setting of hyperchloremia from volume resuscitation  Hold IV fluids due to pleural effusion.  Started  oral bicarb.  Improved.   Acute hypoxic respiratory failure. CAP vs ALI --> ALI in setting of bacteremia  Continue supplemental O2 as needed to maintain SpO2 > 92% received IV lasix 9-13. Will repeat dose today.  Weight 55--51.  Repeat chest x ray 9-15.   Hx  hypothyroidism. Adrenal insuff tsh ok; not on rx  Will discontinue stress dose steroids today 9-14.     DVT prophylaxis: scd Code Status: full code.  Family Communication: family updated twice  Disposition Plan: home when Work up for stroke completed.    SIGNIFICANT EVENTS: 9/11 > admitted with presumed UGIB and shock (combo of sepsis of unclear etiology as well as hypovolemic) > initially planned for emergent EGD > aborted due to hypotension > transferred to ICU. 9/12 BC + Ecoli. Hemodynamically stable. EGD completed: NSAID related gastritis. ABX narrowed.   Consultants:   Ccm    Procedures:  ECHO; Left ventricle: The cavity size was normal. Wall thickness was   normal. Systolic function was normal. The estimated ejection   fraction was in the range of 60% to 65%. Wall motion was normal;   there were no regional wall motion abnormalities. Doppler   parameters are consistent with abnormal left ventricular   relaxation (grade 1 diastolic dysfunction). - Mitral valve: There was mild regurgitation. - Pulmonary arteries: Systolic pressure was mildly increased. PA   peak pressure: 35 mm Hg (S). - Pericardium, extracardiac: There was a left pleural effusion.     Antimicrobials: Primaxin    Subjective: Feeling well, back to baseline.    Objective: Vitals:   06/16/16 1059 06/16/16 1303 06/16/16 2025 06/17/16 0434  BP: (!) 179/84 (!) 180/72 (!) 179/73 (!) 169/81  Pulse: 70 69 76 70  Resp:  17 18 16   Temp:  97.7 F (36.5 C) 98 F (36.7 C) 98.2 F (36.8 C)  TempSrc:  Oral Oral Oral  SpO2:  95% 95% 91%  Weight:    48.9 kg (107 lb 11.2 oz)  Height:        Intake/Output Summary (Last 24 hours) at 06/17/16 1140 Last data filed at 06/17/16 0800  Gross per 24 hour  Intake              700 ml  Output             4051 ml  Net            -3351 ml   Filed Weights   06/14/16 0501 06/16/16 0430 06/17/16 0434  Weight: 55 kg (121 lb 4.8 oz) 51.3 kg (113 lb 3.2 oz) 48.9  kg (107 lb 11.2 oz)    Examination:  General exam: Appears calm and comfortable  Respiratory system: Clear to auscultation. Respiratory effort normal. Cardiovascular system: S1 & S2 heard, RRR. No JVD, murmurs, rubs, gallops or clicks. No pedal edema. Gastrointestinal system: Abdomen is nondistended, soft and nontender. No organomegaly or masses felt. Normal bowel sounds heard. Central nervous system: Alert and oriented. No focal neurological deficits. Extremities: Symmetric 5 x 5 power. Skin: No rashes, lesions or ulcers Psychiatry: Judgement and insight appear normal. Mood & affect appropriate.     Data Reviewed: I have personally reviewed following labs and imaging studies  CBC:  Recent Labs Lab 06/13/16 0153  06/13/16 1054  06/14/16 0000 06/14/16 0521 06/15/16 0747 06/16/16 0437 06/17/16 0340  WBC 1.0*  < > 8.5  --   --  21.2* 19.5* 18.1* 11.7*  NEUTROABS 0.7*  --   --   --   --   --   --  16.4*  --   HGB 8.2*  < > 7.8*  < > 10.9* 11.4* 11.6* 12.5 13.1  HCT 25.4*  < > 23.6*  < > 32.9* 34.6* 35.0* 37.5 39.7  MCV 95.5  < > 95.9  --   --  92.8 90.7 92.6 92.1  PLT 209  < > 201  --   --  178 211 265 246  < > = values in this interval not displayed. Basic Metabolic Panel:  Recent Labs Lab 06/13/16 0557 06/13/16 1054 06/13/16 1232 06/14/16 0521 06/15/16 0747 06/16/16 0437 06/17/16 0340  NA  --  142 143 142 141 145 143  K  --  4.0 4.7 4.5 3.4* 3.8 3.3*  CL  --  115* 114* 117* 117* 111 104  CO2  --  18*  --  16* 17* 27 29  GLUCOSE  --  102* 91 100* 154* 131* 96  BUN  --  19 19 19  28* 21* 19  CREATININE  --  1.46* 1.30* 1.22* 1.13* 1.09* 1.03*  CALCIUM  --  8.0*  --  8.4* 8.8* 9.1 9.0  MG 1.1*  --   --  1.9 1.7  --   --   PHOS  --   --   --  3.5  --   --   --    GFR: Estimated Creatinine Clearance: 32.3 mL/min (by C-G formula based on SCr of 1.03 mg/dL (H)). Liver Function Tests:  Recent Labs Lab 06/13/16 0153 06/15/16 0747  AST 25 49*  ALT 15 95*    ALKPHOS 54 90  BILITOT 0.8 0.6  PROT 5.2* 5.0*  ALBUMIN 3.0* 2.4*   No results for input(s): LIPASE, AMYLASE in the last 168 hours. No results for input(s): AMMONIA in the last 168 hours. Coagulation Profile:  Recent Labs Lab 06/13/16 0557  INR 1.20   Cardiac Enzymes:  Recent Labs Lab 06/14/16 0521 06/14/16 1315 06/15/16 1106 06/15/16 1524 06/15/16 2147  TROPONINI 0.75* 1.07* 0.66* 0.53* 0.40*   BNP (last 3 results) No results for input(s): PROBNP in the last 8760 hours. HbA1C: No results for input(s): HGBA1C in the last 72 hours. CBG:  Recent Labs Lab 06/14/16 0838  GLUCAP 88   Lipid Profile:  Recent Labs  06/17/16 0340  CHOL 200  HDL 39*  LDLCALC 120*  TRIG 203*  CHOLHDL 5.1   Thyroid Function Tests: No results for input(s): TSH, T4TOTAL, FREET4, T3FREE, THYROIDAB in the last 72 hours. Anemia Panel: No results for input(s): VITAMINB12, FOLATE, FERRITIN, TIBC, IRON, RETICCTPCT in the last 72 hours. Sepsis Labs:  Recent Labs Lab 06/13/16 0156 06/13/16 0424 06/13/16 0557 06/13/16 0600 06/13/16 1054 06/14/16 0521 06/15/16 0747  PROCALCITON  --   --  30.85  --   --  42.20 28.79  LATICACIDVEN 2.91* 1.50  --  1.3 1.6  --   --     Recent Results (from the past 240 hour(s))  Blood Culture (routine x 2)     Status: Abnormal   Collection Time: 06/13/16  1:28 AM  Result Value Ref Range Status   Specimen Description BLOOD LEFT ARM  Final   Special Requests BOTTLES DRAWN AEROBIC AND ANAEROBIC  Final   Culture  Setup Time   Final    GRAM NEGATIVE RODS IN BOTH AEROBIC AND ANAEROBIC BOTTLES CRITICAL RESULT CALLED TO,  READ BACK BY AND VERIFIED WITH: T.EGAN, PHARMD AT1720 ON 06/13/16 BY C. JESSUP, MLT.    Culture (A)  Final    ESCHERICHIA COLI SUSCEPTIBILITIES PERFORMED ON PREVIOUS CULTURE WITHIN THE LAST 5 DAYS.    Report Status 06/16/2016 FINAL  Final   Organism ID, Bacteria ESCHERICHIA COLI  Final      Susceptibility   Escherichia coli - MIC*     AMPICILLIN <=2 SENSITIVE Sensitive     CEFAZOLIN <=4 SENSITIVE Sensitive     CEFEPIME <=1 SENSITIVE Sensitive     CEFTAZIDIME <=1 SENSITIVE Sensitive     CEFTRIAXONE <=1 SENSITIVE Sensitive     CIPROFLOXACIN <=0.25 SENSITIVE Sensitive     GENTAMICIN <=1 SENSITIVE Sensitive     IMIPENEM <=0.25 SENSITIVE Sensitive     TRIMETH/SULFA <=20 SENSITIVE Sensitive     AMPICILLIN/SULBACTAM <=2 SENSITIVE Sensitive     PIP/TAZO <=4 SENSITIVE Sensitive     Extended ESBL NEGATIVE Sensitive     * ESCHERICHIA COLI  Blood Culture ID Panel (Reflexed)     Status: Abnormal   Collection Time: 06/13/16  1:28 AM  Result Value Ref Range Status   Enterococcus species NOT DETECTED NOT DETECTED Final   Listeria monocytogenes NOT DETECTED NOT DETECTED Final   Staphylococcus species NOT DETECTED NOT DETECTED Final   Staphylococcus aureus NOT DETECTED NOT DETECTED Final   Streptococcus species NOT DETECTED NOT DETECTED Final   Streptococcus agalactiae NOT DETECTED NOT DETECTED Final   Streptococcus pneumoniae NOT DETECTED NOT DETECTED Final   Streptococcus pyogenes NOT DETECTED NOT DETECTED Final   Acinetobacter baumannii NOT DETECTED NOT DETECTED Final   Enterobacteriaceae species DETECTED (A) NOT DETECTED Final    Comment: CRITICAL RESULT CALLED TO, READ BACK BY AND VERIFIED WITH: T. EGAN, PHARMD AT 1720 ON 06/13/16 BY C. JESSUP, MLT.    Enterobacter cloacae complex NOT DETECTED NOT DETECTED Final   Escherichia coli DETECTED (A) NOT DETECTED Final    Comment: CRITICAL RESULT CALLED TO, READ BACK BY AND VERIFIED WITH: T. EGAN, PHARMD AT 1720 ON 06/13/16 BY C. JESSUP, MLT.    Klebsiella oxytoca NOT DETECTED NOT DETECTED Final   Klebsiella pneumoniae NOT DETECTED NOT DETECTED Final   Proteus species NOT DETECTED NOT DETECTED Final   Serratia marcescens NOT DETECTED NOT DETECTED Final   Carbapenem resistance NOT DETECTED NOT DETECTED Final   Haemophilus influenzae NOT DETECTED NOT DETECTED Final   Neisseria  meningitidis NOT DETECTED NOT DETECTED Final   Pseudomonas aeruginosa NOT DETECTED NOT DETECTED Final   Candida albicans NOT DETECTED NOT DETECTED Final   Candida glabrata NOT DETECTED NOT DETECTED Final   Candida krusei NOT DETECTED NOT DETECTED Final   Candida parapsilosis NOT DETECTED NOT DETECTED Final   Candida tropicalis NOT DETECTED NOT DETECTED Final  Urine culture     Status: Abnormal   Collection Time: 06/13/16  1:55 AM  Result Value Ref Range Status   Specimen Description URINE, RANDOM  Final   Special Requests NONE  Final   Culture >=100,000 COLONIES/mL ESCHERICHIA COLI (A)  Final   Report Status 06/15/2016 FINAL  Final   Organism ID, Bacteria ESCHERICHIA COLI (A)  Final      Susceptibility   Escherichia coli - MIC*    AMPICILLIN <=2 SENSITIVE Sensitive     CEFAZOLIN <=4 SENSITIVE Sensitive     CEFTRIAXONE <=1 SENSITIVE Sensitive     CIPROFLOXACIN <=0.25 SENSITIVE Sensitive     GENTAMICIN <=1 SENSITIVE Sensitive     IMIPENEM <=0.25 SENSITIVE Sensitive  NITROFURANTOIN <=16 SENSITIVE Sensitive     TRIMETH/SULFA <=20 SENSITIVE Sensitive     AMPICILLIN/SULBACTAM <=2 SENSITIVE Sensitive     PIP/TAZO <=4 SENSITIVE Sensitive     Extended ESBL NEGATIVE Sensitive     * >=100,000 COLONIES/mL ESCHERICHIA COLI  Blood Culture (routine x 2)     Status: Abnormal   Collection Time: 06/13/16  2:15 AM  Result Value Ref Range Status   Specimen Description BLOOD LEFT WRIST  Final   Special Requests IN PEDIATRIC BOTTLE  Final   Culture  Setup Time   Final    GRAM NEGATIVE RODS AEROBIC BOTTLE ONLY CRITICAL RESULT CALLED TO, READ BACK BY AND VERIFIED WITH: T. EGAN, PHARMD AT 1720 ON 06/13/16 BY C. JESSUP, MLT.    Culture (A)  Final    ESCHERICHIA COLI SUSCEPTIBILITIES PERFORMED ON PREVIOUS CULTURE WITHIN THE LAST 5 DAYS.    Report Status 06/15/2016 FINAL  Final  Respiratory Panel by PCR     Status: None   Collection Time: 06/13/16  9:19 AM  Result Value Ref Range Status    Adenovirus NOT DETECTED NOT DETECTED Final   Coronavirus 229E NOT DETECTED NOT DETECTED Final   Coronavirus HKU1 NOT DETECTED NOT DETECTED Final   Coronavirus NL63 NOT DETECTED NOT DETECTED Final   Coronavirus OC43 NOT DETECTED NOT DETECTED Final   Metapneumovirus NOT DETECTED NOT DETECTED Final   Rhinovirus / Enterovirus NOT DETECTED NOT DETECTED Final   Influenza A NOT DETECTED NOT DETECTED Final   Influenza B NOT DETECTED NOT DETECTED Final   Parainfluenza Virus 1 NOT DETECTED NOT DETECTED Final   Parainfluenza Virus 2 NOT DETECTED NOT DETECTED Final   Parainfluenza Virus 3 NOT DETECTED NOT DETECTED Final   Parainfluenza Virus 4 NOT DETECTED NOT DETECTED Final   Respiratory Syncytial Virus NOT DETECTED NOT DETECTED Final   Bordetella pertussis NOT DETECTED NOT DETECTED Final   Chlamydophila pneumoniae NOT DETECTED NOT DETECTED Final   Mycoplasma pneumoniae NOT DETECTED NOT DETECTED Final         Radiology Studies: Dg Chest 2 View  Result Date: 06/17/2016 CLINICAL DATA:  Followup pleural effusion. EXAM: CHEST  2 VIEW COMPARISON:  06/15/2016 FINDINGS: Normal heart size. Bilateral pleural effusions are identified which appear stable to decreased in volume from previous exam. Interval improvement in pulmonary edema. IMPRESSION: Improving pulmonary edema with stable to decrease in volume of pleural effusions. Electronically Signed   By: Signa Kell M.D.   On: 06/17/2016 09:04   Ct Head Wo Contrast  Result Date: 06/15/2016 CLINICAL DATA:  80 year old female with sudden onset of confusion this morning. No associated head pain. EXAM: CT HEAD WITHOUT CONTRAST TECHNIQUE: Contiguous axial images were obtained from the base of the skull through the vertex without intravenous contrast. COMPARISON:  Head CT 12/08/2009. FINDINGS: Brain: Mild cerebral atrophy. Patchy and confluent areas of decreased attenuation are noted throughout the deep and periventricular white matter of the cerebral  hemispheres bilaterally, compatible with chronic microvascular ischemic disease. No evidence of acute infarction, hemorrhage, hydrocephalus, extra-axial collection or mass lesion/mass effect. Vascular: No hyperdense vessel. Calcified atherosclerotic plaque in the right vertebral artery (image 3 of series 2), new compared to the prior examination. Based on the appearance, there may be a hemodynamically significant stenosis in this vessel. Skull: Normal. Negative for fracture or focal lesion. Sinuses/Orbits: No acute finding. Other: None. IMPRESSION: 1. No definite acute intracranial abnormalities. 2. There is a new densely calcified plaque in the right vertebral artery (as above) compared  to prior study from 12/08/2009. Although not assessable on today's noncontrast CT examination, the appearance could suggest a hemodynamically significant stenosis. If there are signs or symptoms of vertebrobasilar insufficiency, further evaluation with noncontrast brain MRI/MRA could be considered. 3. Mild cerebral atrophy with mild chronic microvascular ischemic changes in the cerebral white matter, as above. Electronically Signed   By: Trudie Reed M.D.   On: 06/15/2016 12:23   [name] Head Wo Contrast  Result Date: 06/16/2016 CLINICAL DATA:  Initial valuation for delirium. Evaluate for stroke. EXAM: MRI HEAD WITHOUT CONTRAST MRA HEAD WITHOUT CONTRAST TECHNIQUE: Multiplanar, multiecho pulse sequences of the brain and surrounding structures were obtained without intravenous contrast. Angiographic images of the head were obtained using MRA technique without contrast. COMPARISON:  Prior CT 07/15/2016. FINDINGS: MRI HEAD FINDINGS Mild diffuse prominence of the CSF containing spaces is compatible with generalized cerebral atrophy, within normal limits for patient age. Patchy and confluent T2/FLAIR hyperintensity within the periventricular and deep white matter both cerebral hemispheres most compatible with chronic microvascular  ischemic disease, moderate in nature. There are patchy multi vocal folds ladder of restricted diffusion involving the right PCA territory, involving the right occipital lobe and mesial right temporal lobe, with additional 0 punctate infarct within the right thalamus. Associated T2/FLAIR signal abnormality. ADC signal appears to be somewhat normalizing at this point, compatible with acute/early subacute infarct. No associated hemorrhage or mass effect. No other acute infarct. Gray-white matter differentiation otherwise maintained. Major intracranial vascular flow voids are grossly preserved. Left vertebral artery appears to be hypoplastic. Prominent atheromatous plaque at the right V4 segment. No mass lesion, midline shift, or mass effect. No hydrocephalus. No extra-axial fluid collection. Major dural sinuses are grossly patent. Craniocervical junction normal. Visualized upper cervical spine unremarkable. Pituitary gland within normal limits. No acute abnormality about the globes and orbits. Patient is status post lens extraction bilaterally. Paranasal sinuses are clear. Mild scattered opacity within the bilateral mastoid air cells, of doubtful clinical significance. Inner ear structures grossly normal. Bone marrow signal intensity within normal limits. No scalp soft tissue abnormality. MRA HEAD FINDINGS ANTERIOR CIRCULATION: Visualized distal cervical segments of the internal carotid arteries are widely patent with antegrade flow. Petrous, cavernous, and supraclinoid segments widely patent without flow-limiting stenosis. A1 segments patent bilaterally. The left A1 segment is hypoplastic. Anterior communicating artery normal. Short-segment severe stenosis left A2 segment present (series 5, image 64). Right ACA demonstrates mild irregularity without severe stenosis. M1 segments demonstrate mild atheromatous irregularity. Mild smooth narrowing of the distal M1 segments bilaterally without focal high-grade stenosis.  Distal small vessel atheromatous irregularity within the MCA branches bilaterally. POSTERIOR CIRCULATION: Right vertebral artery dominant and patent to the vertebrobasilar junction without focal high-grade stenosis. Hypoplastic left vertebral artery terminates in PICA. Posterior inferior cerebral arteries themselves are patent proximally. Basilar artery demonstrates mild multi focal atheromatous irregularity and stenoses. Superior cerebral arteries patent bilaterally. Basilar tip mildly ectatic without focal aneurysm. Left P1 segment somewhat hypoplastic with a prominent left posterior communicating artery. Multifocal atheromatous irregularity with tandem severe nonocclusive stenoses within the left PCA (series 554, image 5). Left PCA is opacified to its distal aspect. There is fetal origin of the right PCA supplied via a patent right posterior communicating artery. Right PCA 8 attenuated proximally Ing and is not seen distally, suspected to be occluded given the right PCA infarcts. No aneurysm or vascular malformation. IMPRESSION: MRI HEAD IMPRESSION: 1. Acute/early subacute ischemic nonhemorrhagic right PCA territory infarcts. 2. Moderate chronic microvascular ischemic disease. MRA HEAD  IMPRESSION: 1. Occlusion of the distal right PCA. Note is made of a fetal type right PCA. 2. Atheromatous irregularity with severe tandem nonocclusive the stenoses within the left PCA. 3. Severe nonocclusive left A2 stenosis. 4. Additional more mild atheromatous disease involving the anterior posterior circulation as above. Electronically Signed   By: Rise Mu M.D.   On: 06/16/2016 12:53   Mr Brain Wo Contrast  Result Date: 06/16/2016 CLINICAL DATA:  Initial valuation for delirium. Evaluate for stroke. EXAM: MRI HEAD WITHOUT CONTRAST MRA HEAD WITHOUT CONTRAST TECHNIQUE: Multiplanar, multiecho pulse sequences of the brain and surrounding structures were obtained without intravenous contrast. Angiographic images of  the head were obtained using MRA technique without contrast. COMPARISON:  Prior CT 07/15/2016. FINDINGS: MRI HEAD FINDINGS Mild diffuse prominence of the CSF containing spaces is compatible with generalized cerebral atrophy, within normal limits for patient age. Patchy and confluent T2/FLAIR hyperintensity within the periventricular and deep white matter both cerebral hemispheres most compatible with chronic microvascular ischemic disease, moderate in nature. There are patchy multi vocal folds ladder of restricted diffusion involving the right PCA territory, involving the right occipital lobe and mesial right temporal lobe, with additional 0 punctate infarct within the right thalamus. Associated T2/FLAIR signal abnormality. ADC signal appears to be somewhat normalizing at this point, compatible with acute/early subacute infarct. No associated hemorrhage or mass effect. No other acute infarct. Gray-white matter differentiation otherwise maintained. Major intracranial vascular flow voids are grossly preserved. Left vertebral artery appears to be hypoplastic. Prominent atheromatous plaque at the right V4 segment. No mass lesion, midline shift, or mass effect. No hydrocephalus. No extra-axial fluid collection. Major dural sinuses are grossly patent. Craniocervical junction normal. Visualized upper cervical spine unremarkable. Pituitary gland within normal limits. No acute abnormality about the globes and orbits. Patient is status post lens extraction bilaterally. Paranasal sinuses are clear. Mild scattered opacity within the bilateral mastoid air cells, of doubtful clinical significance. Inner ear structures grossly normal. Bone marrow signal intensity within normal limits. No scalp soft tissue abnormality. MRA HEAD FINDINGS ANTERIOR CIRCULATION: Visualized distal cervical segments of the internal carotid arteries are widely patent with antegrade flow. Petrous, cavernous, and supraclinoid segments widely patent without  flow-limiting stenosis. A1 segments patent bilaterally. The left A1 segment is hypoplastic. Anterior communicating artery normal. Short-segment severe stenosis left A2 segment present (series 5, image 64). Right ACA demonstrates mild irregularity without severe stenosis. M1 segments demonstrate mild atheromatous irregularity. Mild smooth narrowing of the distal M1 segments bilaterally without focal high-grade stenosis. Distal small vessel atheromatous irregularity within the MCA branches bilaterally. POSTERIOR CIRCULATION: Right vertebral artery dominant and patent to the vertebrobasilar junction without focal high-grade stenosis. Hypoplastic left vertebral artery terminates in PICA. Posterior inferior cerebral arteries themselves are patent proximally. Basilar artery demonstrates mild multi focal atheromatous irregularity and stenoses. Superior cerebral arteries patent bilaterally. Basilar tip mildly ectatic without focal aneurysm. Left P1 segment somewhat hypoplastic with a prominent left posterior communicating artery. Multifocal atheromatous irregularity with tandem severe nonocclusive stenoses within the left PCA (series 554, image 5). Left PCA is opacified to its distal aspect. There is fetal origin of the right PCA supplied via a patent right posterior communicating artery. Right PCA 8 attenuated proximally Ing and is not seen distally, suspected to be occluded given the right PCA infarcts. No aneurysm or vascular malformation. IMPRESSION: MRI HEAD IMPRESSION: 1. Acute/early subacute ischemic nonhemorrhagic right PCA territory infarcts. 2. Moderate chronic microvascular ischemic disease. MRA HEAD IMPRESSION: 1. Occlusion of the distal right PCA.  Note is made of a fetal type right PCA. 2. Atheromatous irregularity with severe tandem nonocclusive the stenoses within the left PCA. 3. Severe nonocclusive left A2 stenosis. 4. Additional more mild atheromatous disease involving the anterior posterior circulation as  above. Electronically Signed   By: Rise Mu M.D.   On: 06/16/2016 12:53        Scheduled Meds: . amLODipine  2.5 mg Oral Daily  . aspirin EC  81 mg Oral Daily  . cefTRIAXone (ROCEPHIN)  IV  2 g Intravenous Q24H  . metoprolol tartrate  25 mg Oral BID  . omega-3 acid ethyl esters  1 g Oral Daily  . pantoprazole  40 mg Intravenous Q12H  . potassium chloride  40 mEq Oral Once  . simvastatin  20 mg Oral q1800  . sodium chloride  2,000 mL Intravenous Once   Continuous Infusions:     LOS: 4 days    Time spent:35 minutes.     Alba Cory, MD [facility] Pager [phone]  If 7PM-7AM, please contact night-coverage [email or web address] Password [email or web address] 06/17/2016, 11:40 AM

## 2016-06-17 NOTE — Progress Notes (Signed)
Physical Therapy Treatment/ Discharge Patient Details Name: [name] MRN: [id number] DOB: Jul 28, 1936 Today's Date: 06/17/2016    History of Present Illness Pt adm with UGI bleed. Received blood and underwent EGD. Right PCA infarct noted on MRI.  PMH - HTN, ckd    PT Comments    Pt reports return to baseline cognition and mobility although fatigued from lack of sleep and interruptions in acute environment. Pt with 5/5 bil LE strength, no balance deficits noted, able to perform long hall ambulation and stairs without assist. Pt educated for need to maintain up in chair for meals as well as ambulating 3x/day to recover stamina to baseline. No further therapy needs at this time with pt and daughter aware and agreeable.   Follow Up Recommendations  No PT follow up     Equipment Recommendations  None recommended by PT    Recommendations for Other Services       Precautions / Restrictions Precautions Precautions: None    Mobility  Bed Mobility Overal bed mobility: Independent                Transfers Overall transfer level: Independent                  Ambulation/Gait Ambulation/Gait assistance: Modified independent (Device/Increase time) Ambulation Distance (Feet): 450 Feet Assistive device: None Gait Pattern/deviations: WFL(Within Functional Limits)   Gait velocity interpretation: Below normal speed for age/gender     Stairs Stairs: Yes Stairs assistance: Modified independent (Device/Increase time) Stair Management: One rail Right;Alternating pattern;Forwards Number of Stairs: 3    Wheelchair Mobility    Modified Rankin (Stroke Patients Only)       Balance Overall balance assessment: No apparent balance deficits (not formally assessed)                                  Cognition Arousal/Alertness: Awake/alert Behavior During Therapy: WFL for tasks assessed/performed Overall Cognitive Status: Within Functional Limits for tasks  assessed                      Exercises      General Comments        Pertinent Vitals/Pain Pain Assessment: No/denies pain Pain Score: 3  Pain Location: HA Pain Descriptors / Indicators: Aching Pain Intervention(s): Limited activity within patient's tolerance    Home Living     Available Help at Discharge: Family;Available 24 hours/day Type of Home: House              Prior Function            PT Goals (current goals can now be found in the care plan section) Progress towards PT goals: Progressing toward goals;Goals met/education completed, patient discharged from PT    Frequency   PT Diagnosis      Leukopenia  CKD (chronic kidney disease), stage III  Essential hypertension  Dehydration  Nausea vomiting and diarrhea  Other iron deficiency anemias  CAP (community acquired pneumonia)  UTI (lower urinary tract infection)  AKI (acute kidney injury) ([place]) - Plan: US Renal, US Renal  Respiratory failure ([place]) - Plan: DG Chest Port 1 [place], DG Chest Port 1 [place]  Encounter for central line placement - Plan: DG Chest Port 1 [place], DG Chest Port 1 [place]  ARDS (adult respiratory distress syndrome) ([place]) - Plan: DG Chest Port 1 [place], DG Chest Port 1 [place]  Confusion  Cough -  Plan: DG Chest 2 View, DG Chest 2 View  Difficulty in walking, not elsewhere classified- NOTE: only code applicable to this visit, unable to delete others at this time     PT Plan Discharge plan needs to be updated    Co-evaluation             End of Session   Activity Tolerance: Patient tolerated treatment well Patient left: in chair;with call bell/phone within reach;with family/visitor present     Time: 3875-6433 PT Time Calculation (min) (ACUTE ONLY): 12 min  Charges:  $Gait Training: 8-22 mins                    G Codes:      Melford Aase 2016-07-07, 1:13 PM Elwyn Reach, [place]

## 2016-06-17 NOTE — Progress Notes (Signed)
STROKE TEAM PROGRESS NOTE   HISTORY OF PRESENT ILLNESS (per record) Syona Heldnn V Starace is an 80 y.o. female brought to hospital with UGIB and GNR bacteremia. Patient has had encephalopathy since admission. MRI was obtained for Delirium while in hospital and revealed a right PCA infarct. MRA showed Occlusion of the distal right PCA. Note is made of a fetal type right PCA. Along with  Atheromatous irregularity with severe tandem nonocclusive the stenoses within the left PCA, Severe nonocclusive left A2 stenosis. Neurology was asked to see due to MRI findings.   Currently she is Afebrile with WBC 18.1--down from 21.2.   Currently patient has not confused and feels back to baseline. Daughter at bedside also concurs that she is at baseline. In discussing her recent stroke, patient has no idea when this might of happened as she does not feel that she had any symptoms.   Date last known well: Unable to determine Time last known well: Unable to determine tPA Given: No: no LSN   SUBJECTIVE (INTERVAL HISTORY) Her son is at the bedside.  Overall she feels her condition is completely resolved. However, due to bacteremia and stroke, we have to rule out endocarditis for treatment purpose. Pt is in agreement.    OBJECTIVE Temp:  [97.7 F (36.5 C)-98.2 F (36.8 C)] 98.2 F (36.8 C) (09/15 0434) Pulse Rate:  [69-76] 70 (09/15 0434) Cardiac Rhythm: Supraventricular tachycardia (09/14 2202) Resp:  [16-18] 16 (09/15 0434) BP: (169-180)/(72-84) 169/81 (09/15 0434) SpO2:  [91 %-95 %] 91 % (09/15 0434) Weight:  [48.9 kg (107 lb 11.2 oz)] 48.9 kg (107 lb 11.2 oz) (09/15 0434)  CBC:  Recent Labs Lab 06/13/16 0153  06/16/16 0437 06/17/16 0340  WBC 1.0*  < > 18.1* 11.7*  NEUTROABS 0.7*  --  16.4*  --   HGB 8.2*  < > 12.5 13.1  HCT 25.4*  < > 37.5 39.7  MCV 95.5  < > 92.6 92.1  PLT 209  < > 265 246  < > = values in this interval not displayed.  Basic Metabolic Panel:  Recent Labs Lab 06/14/16 0521  06/15/16 0747 06/16/16 0437 06/17/16 0340  NA 142 141 145 143  K 4.5 3.4* 3.8 3.3*  CL 117* 117* 111 104  CO2 16* 17* 27 29  GLUCOSE 100* 154* 131* 96  BUN 19 28* 21* 19  CREATININE 1.22* 1.13* 1.09* 1.03*  CALCIUM 8.4* 8.8* 9.1 9.0  MG 1.9 1.7  --   --   PHOS 3.5  --   --   --     Lipid Panel:    Component Value Date/Time   CHOL 200 06/17/2016 0340   TRIG 203 (H) 06/17/2016 0340   HDL 39 (L) 06/17/2016 0340   CHOLHDL 5.1 06/17/2016 0340   VLDL 41 (H) 06/17/2016 0340   LDLCALC 120 (H) 06/17/2016 0340   HgbA1c: No results found for: HGBA1C Urine Drug Screen: No results found for: LABOPIA, COCAINSCRNUR, LABBENZ, AMPHETMU, THCU, LABBARB    IMAGING I have personally reviewed the radiological images below and agree with the radiology interpretations.  Ct Head Wo Contrast 06/15/2016 1. No definite acute intracranial abnormalities.  2. There is a new densely calcified plaque in the right vertebral artery (as above) compared to prior study from 12/08/2009. Although not assessable on today's noncontrast CT examination, the appearance could suggest a hemodynamically significant stenosis. If there are signs or symptoms of vertebrobasilar insufficiency, further evaluation with noncontrast brain MRI/MRA could be considered.  3. Mild  cerebral atrophy with mild chronic microvascular ischemic changes in the cerebral white matter, as above.   [name] Head Wo Contrast  06/16/2016  MRI HEAD  1. Acute/early subacute ischemic nonhemorrhagic right PCA territory infarcts.  2. Moderate chronic microvascular ischemic disease.   MRA HEAD  1. Occlusion of the distal right PCA. Note is made of a fetal type right PCA.  2. Atheromatous irregularity with severe tandem nonocclusive the stenoses within the left PCA.  3. Severe nonocclusive left A2 stenosis.  4. Additional more mild atheromatous disease involving the anterior posterior circulation as above.   Transthoracic  echocardiogram 06/15/2016 Study Conclusions - Left ventricle: The cavity size was normal. Wall thickness was   normal. Systolic function was normal. The estimated ejection   fraction was in the range of 60% to 65%. Wall motion was normal;   there were no regional wall motion abnormalities. Doppler   parameters are consistent with abnormal left ventricular   relaxation (grade 1 diastolic dysfunction). - Mitral valve: There was mild regurgitation. - Pulmonary arteries: Systolic pressure was mildly increased. PA   peak pressure: 35 mm Hg (S). - Pericardium, extracardiac: There was a left pleural effusion.  CUS pending    PHYSICAL EXAM HEENT-  Normocephalic, no lesions, without obvious abnormality.  Normal external eye and conjunctiva.  Normal TM's bilaterally.  Normal auditory canals and external ears. Normal external nose, mucus membranes and septum.  Normal pharynx. Cardiovascular- S1, S2 normal, pulses palpable throughout   Lungs- chest clear, no wheezing, rales, normal symmetric air entry Abdomen- normal findings: bowel sounds normal Extremities- no edema Lymph-no adenopathy palpable Musculoskeletal-no joint tenderness, deformity or swelling Skin-warm and dry, no hyperpigmentation, vitiligo, or suspicious lesions  Neurological Examination Mental Status: Alert, oriented, thought content appropriate.  Speech fluent without evidence of aphasia.  Able to follow 3 step commands without difficulty. Cranial Nerves: II: Discs flat bilaterally; Visual fields grossly normal, pupils equal, round, reactive to light and accommodation III,IV, VI: ptosis not present, extra-ocular motions intact bilaterally V,VII: smile symmetric, facial light touch sensation normal bilaterally VIII: hearing normal bilaterally IX,X: uvula rises symmetrically XI: bilateral shoulder shrug XII: midline tongue extension Motor: Right :  Upper extremity   5/5                                      Left:     Upper  extremity   5/5             Lower extremity   5/5                                                  Lower extremity   5/5 Tone and bulk:normal tone throughout; no atrophy noted Sensory: Pinprick and light touch intact throughout, bilaterally Deep Tendon Reflexes: 2+ and symmetric throughout Plantars: Right: downgoing                                Left: downgoing Cerebellar: normal finger-to-nose and normal heel-to-shin test Gait: normal gait and station   ASSESSMENT/PLAN  Ms. JESSIKAH DICKER is a 80 y.o. female with history of hypertension, chronic kidney disease, thyroid disease, recent upper GI bleed and gram negative rods bacteremia  presenting with altered mental status. She did not receive IV t-PA due to unknown time of onset and upper GI bleed.   Stroke:  Right PCA scattered infarcts, embolic secondary to an unknown source but endocarditis needs to be ruled out.  Resultant  No neuro deficit  MRI - Acute/early subacute scattered right PCA territory infarcts.   MRA - Occlusion of the distal right PCA. Left PCA high grade stenosis. Severe nonocclusive left A2 stenosis. Bilateral MCA atherosclerosis. No mycotic aneurysm seen.  Carotid Doppler - pending  2D Echo - EF 60-65%. No cardiac source of emboli identified.  LDL- 120  HgbA1c pending  VTE prophylaxis - SCDs  Diet regular Room service appropriate? Yes; Fluid consistency: Thin  No antithrombotic prior to admission, now on aspirin 81 mg daily given no mycotic aneurysm seen.   Patient counseled to be compliant with her antithrombotic medications  Ongoing aggressive stroke risk factor management  Therapy recommendations: No PT or OT follow-up recommended  Disposition: pending  Bacteremia  Blood culture showed Escherichia coli  On imipenem  ID on board, agree with TEE  Due to right PCA stroke, endocarditis needs to be ruled out which could potentially change antibiotics course  Recommend TEE on Monday, patient is  in agreement  Upper GI bleeding  Hb 6.8 -> 13.1  GI okay with aspirin 81  No more bleeding  Hypertension  Blood pressures running mildly high.  Permissive hypertension (OK if < 220/120) but gradually normalize in 5-7 days  Long-term BP goal normotensive  Hyperlipidemia  Home meds:  Lovaza 1 gm daily.  LDL 120, goal < 70  Now on Zocor 20 mg daily.  Continue statin at discharge  Other Stroke Risk Factors  Advanced age  Family hx stroke (Mother)  Other Active Problems  Leukocytosis - 18.1 -> 11.7  Hypokalemia - 3.3   (supplemented)  Chronic kidney disease - creatinine 1.03  Hospital day # 4  Marvel Plan, MD PhD Stroke Neurology 06/17/2016 5:03 PM   To contact Stroke Continuity provider, please refer to [email or web address]. After hours, contact General Neurology

## 2016-06-17 NOTE — Progress Notes (Signed)
This note is an error.

## 2016-06-18 ENCOUNTER — Inpatient Hospital Stay (HOSPITAL_COMMUNITY): Payer: BC Managed Care – PPO

## 2016-06-18 DIAGNOSIS — I639 Cerebral infarction, unspecified: Secondary | ICD-10-CM

## 2016-06-18 LAB — VAS US CAROTID
LCCADDIAS: -18 cm/s
LCCAPDIAS: 18 cm/s
LEFT ECA DIAS: -7 cm/s
LEFT VERTEBRAL DIAS: -11 cm/s
LICADDIAS: -32 cm/s
LICADSYS: -103 cm/s
LICAPDIAS: -17 cm/s
LICAPSYS: -75 cm/s
Left CCA dist sys: -70 cm/s
Left CCA prox sys: 66 cm/s
RIGHT ECA DIAS: -8 cm/s
RIGHT VERTEBRAL DIAS: 17 cm/s
Right CCA prox dias: 20 cm/s
Right CCA prox sys: 67 cm/s
Right cca dist sys: -83 cm/s

## 2016-06-18 LAB — BASIC METABOLIC PANEL
Anion gap: 6 (ref 5–15)
BUN: 16 mg/dL (ref 6–20)
CHLORIDE: 104 mmol/L (ref 101–111)
CO2: 30 mmol/L (ref 22–32)
CREATININE: 1.02 mg/dL — AB (ref 0.44–1.00)
Calcium: 9.2 mg/dL (ref 8.9–10.3)
GFR calc non Af Amer: 51 mL/min — ABNORMAL LOW (ref >60)
GFR, EST AFRICAN AMERICAN: 59 mL/min — AB (ref >60)
Glucose, Bld: 107 mg/dL — ABNORMAL HIGH (ref 65–99)
POTASSIUM: 4 mmol/L (ref 3.5–5.1)
Sodium: 140 mmol/L (ref 135–145)

## 2016-06-18 LAB — HEMOGLOBIN A1C
Hgb A1c MFr Bld: 5.1 % (ref 4.8–5.6)
MEAN PLASMA GLUCOSE: 100 mg/dL

## 2016-06-18 MED ORDER — AMLODIPINE BESYLATE 5 MG PO TABS
5.0000 mg | ORAL_TABLET | Freq: Every day | ORAL | Status: DC
  Administered 2016-06-18 – 2016-06-21 (×4): 5 mg via ORAL
  Filled 2016-06-18 (×4): qty 1

## 2016-06-18 MED ORDER — METOPROLOL TARTRATE 5 MG/5ML IV SOLN: 5.0000 mg | Freq: Four times a day (QID) | INTRAVENOUS | Status: DC | PRN

## 2016-06-18 MED ORDER — ENSURE ENLIVE PO LIQD
237.0000 mL | Freq: Two times a day (BID) | ORAL | Status: DC
Start: 2016-06-18 — End: 2016-06-21
  Administered 2016-06-18 (×2): 237 mL via ORAL

## 2016-06-18 MED ORDER — POLYETHYLENE GLYCOL 3350 17 G PO PACK
17.0000 g | PACK | Freq: Two times a day (BID) | ORAL | Status: DC
  Administered 2016-06-18 – 2016-06-19 (×3): 17 g via ORAL
  Filled 2016-06-18 (×5): qty 1

## 2016-06-18 MED ORDER — SENNOSIDES-DOCUSATE SODIUM 8.6-50 MG PO TABS
1.0000 | ORAL_TABLET | Freq: Two times a day (BID) | ORAL | Status: DC
  Administered 2016-06-18 – 2016-06-21 (×6): 1 via ORAL
  Filled 2016-06-18 (×7): qty 1

## 2016-06-18 NOTE — Progress Notes (Signed)
PROGRESS NOTE    [name]  QIO:[id number] DOB: 04-11-1936 DOA: 06/13/2016 PCP: Pearson GrippeJames Kim, MD    Brief Narrative: HISTORY OF PRESENT ILLNESS:   80 y.o. female admitted 9/11 with presumed UGIB as well as shock w/ GNR (ECOLI) bacteremia. EGD showed findings c/w NSAID (9/12) related gastritis. I suspect that the bacteremia was d/t bacterial translocation from gut vs UT source. Will dc vanc, cont imipenem until sensitivities are available. She does have some delirium s/p EGD from meds but this is rapidly clearing. For today: adv diet, cont PPI, dc vanc dc NSAIDs, & cont primaxin. Will give lasix x 1 for what is likely ALI and follow CXR, Will order ECHO to assess for wall motion abnormality although suspect that trop bump is simply r/t sepsis. She will move to tele this afternoon if stays stable.   Assessment & Plan:   Principal Problem:   Sepsis (HCC) Active Problems:   Acute renal failure superimposed on stage 3 chronic kidney disease (HCC)   Essential hypertension   CAP (community acquired pneumonia)   Nausea vomiting and diarrhea   Neutropenia (HCC)   Anxiety   UGIB (upper gastrointestinal bleed)   Hypokalemia   Leukopenia   AKI (acute kidney injury) (HCC)   Dehydration   Encounter for central line placement   Difficult intravenous access   E coli bacteremia   Iron deficiency anemia due to chronic blood loss   Cerebrovascular accident (CVA) due to embolism of right posterior cerebral artery [facility](HCC)  DISCUSSION: 80 y.o. female admitted 9/11 with presumed UGIB as well as shock w/ GNR (ECOLI) bacteremia. EGD showed findings c/w NSAID (9/12) related gastritis. I suspect that the bacteremia was d/t bacterial translocation from gut vs UT source. Will dc vanc, cont imipenem until sensitivities are available. She does have some delirium s/p EGD from meds but this is rapidly clearing. For today: adv diet, cont PPI, dc vanc dc NSAIDs, & cont primaxin. Will give lasix x 1 for what is likely  ALI and follow CXR, Will order ECHO to assess for wall motion abnormality although suspect that trop bump is simply r/t sepsis. She will move to tele this afternoon if stays stable.    Hemorrhagic and septic shock-->resolved.  Hx HTN.  Mild troponin bump in setting of sepsis, trending down, ECHO with Normal EF Goal MAP > 65.  E coli.  bacteremia -->ecoli almost certainly from gut translocation: wbc rising (likely from steroids) PCT rising but should level out and start to drop soon.  Cont PCT algo  Dc vanc. Continue with ceftriaxone, plan for TEE on Monday   PNA, pleural effusion;  IV lasix today. Repeat chest x ray 9-15.  WBC trending down, pleural effusion decreasing.  Change antibiotics to ceftriaxone.   Acute delirium-->d/t sedating meds for EGD Cont supportive care  Frequent re-orientation  Avoid IV benzos.   Acute stroke;  MRI with acute-subacute stroke, occlusion of Right PCA.  Neurology consulted, and recommending TEE. Discussed with Dr Elnoria HowardHung, ok to use baby aspirin. If we are thinking Endocarditis will need to discontinue aspirin.  Plan for TEE on Monday to rule out Endocarditis.   HTN will increase norvasc to 5 mg daily   Acute blood loss anemia - from UGIB (NSAID related gastritis) Endoscopy: Normal esophagus.- Non-bleeding gastric ulcers with no stigmata of bleeding. Biopsied. - Multiple non-bleeding duodenal ulcers with no stigmata of bleeding. Cont daily cbc SCD's only. Dc asa NO NSAIDS F/u post-surgical path continue with protonix.  AKI-->improved Left renal cyst-->no hydro.  NAG metabolic acidosis in setting of hyperchloremia from volume resuscitation  Hold IV fluids due to pleural effusion.  Started  oral bicarb.  Improved.   Acute hypoxic respiratory failure. CAP vs ALI --> ALI in setting of bacteremia  Continue supplemental O2 as needed to maintain SpO2 > 92% received IV lasix 9-13. Will repeat dose today.  Weight 55--51.  Repeat chest x ray  9-15.   Hx hypothyroidism. Adrenal insuff tsh ok; not on rx  Will discontinue stress dose steroids today 9-14.   Constipation; Miralax ordered.    DVT prophylaxis: scd Code Status: full code.  Family Communication: family updated twice  Disposition Plan: home when Work up for stroke completed.    SIGNIFICANT EVENTS: 9/11 > admitted with presumed UGIB and shock (combo of sepsis of unclear etiology as well as hypovolemic) > initially planned for emergent EGD > aborted due to hypotension > transferred to ICU. 9/12 BC + Ecoli. Hemodynamically stable. EGD completed: NSAID related gastritis. ABX narrowed.   Consultants:   Ccm    Procedures:  ECHO; Left ventricle: The cavity size was normal. Wall thickness was   normal. Systolic function was normal. The estimated ejection   fraction was in the range of 60% to 65%. Wall motion was normal;   there were no regional wall motion abnormalities. Doppler   parameters are consistent with abnormal left ventricular   relaxation (grade 1 diastolic dysfunction). - Mitral valve: There was mild regurgitation. - Pulmonary arteries: Systolic pressure was mildly increased. PA   peak pressure: 35 mm Hg (S). - Pericardium, extracardiac: There was a left pleural effusion.     Antimicrobials: Primaxin    Subjective: Report constipation , no BM since Sunday. Mild abdominal pain.    Objective: Vitals:   06/17/16 1143 06/17/16 1418 06/17/16 2000 06/18/16 0522  BP: (!) 151/67 (!) 151/63 (!) 148/56 (!) 174/74  Pulse: 77 73 71 67  Resp:  17 18 18  Temp:  98.4 F (36.9 C) 98.2 F (36.8 C) 98 F (36.7 C)  TempSrc:  Oral Oral Oral  SpO2:  97% 94% 96%  Weight:    48.9 kg (107 lb 12.8 oz)  Height:        Intake/Output Summary (Last 24 hours) at 06/18/16 1320 Last data filed at 06/18/16 1202  Gross per 24 hour  Intake              100 ml  Output              95 0 ml  Net             -850 ml   Filed Weights   06/16/16 0430 06/17/16  0434 06/18/16 0522  Weight: 51.3 kg (113 lb 3.2 oz) 48.9 kg (107 lb 11.2 oz) 48.9 kg (107 lb 12.8 oz)    Examination:  General exam: Appears calm and comfortable  Respiratory system: Clear to auscultation. Respiratory effort normal. Cardiovascular system: S1 & S2 heard, RRR. No JVD, murmurs, rubs, gallops or clicks. No pedal edema. Gastrointestinal system: Abdomen is nondistended, soft and nontender. No organomegaly or masses felt. Normal bowel sounds heard. Central nervous system: Alert and oriented. No focal neurological deficits. Extremities: Symmetric 5 x 5 power. Skin: No rashes, lesions or ulcers Psychiatry: Judgement and insight appear normal. Mood & affect appropriate.     Data Reviewed: I have personally reviewed following labs and imaging studies  CBC:  Recent Labs Lab 06/13/16 0153  06/13/16 1054  06/14/16 0000 06/14/16 0521 06/15/16 0747 06/16/16 0437 06/17/16 0340  WBC 1.0*  < > 8.5  --   --  21.2* 19.5* 18.1* 11.7*  NEUTROABS 0.7*  --   --   --   --   --   --  16.4*  --   HGB 8.2*  < > 7.8*  < > 10.9* 11.4* 11.6* 12.5 13.1  HCT 25.4*  < > 23.6*  < > 32.9* 34.6* 35.0* 37.5 39.7  MCV 95.5  < > 95.9  --   --  92.8 90.7 92.6 92.1  PLT 209  < > 201  --   --  178 211 265 246  < > = values in this interval not displayed. Basic Metabolic Panel:  Recent Labs Lab 06/13/16 0557  06/14/16 0521 06/15/16 0747 06/16/16 0437 06/17/16 0340 06/18/16 0305  NA  --   < > 142 141 145 143 140  K  --   < > 4.5 3.4* 3.8 3.3* 4.0  CL  --   < > 117* 117* 111 104 104  CO2  --   < > 16* 17* 27 29 30   GLUCOSE  --   < > 100* 154* 131* 96 107*  BUN  --   < > 19 28* 21* 19 16  CREATININE  --   < > 1.22* 1.13* 1.09* 1.03* 1.02*  CALCIUM  --   < > 8.4* 8.8* 9.1 9.0 9.2  MG 1.1*  --  1.9 1.7  --   --   --   PHOS  --   --  3.5  --   --   --   --   < > = values in this interval not displayed. GFR: Estimated Creatinine Clearance: 32.7 mL/min (by C-G formula based on SCr of 1.02  mg/dL (H)). Liver Function Tests:  Recent Labs Lab 06/13/16 0153 06/15/16 0747  AST 25 49*  ALT 15 95*  ALKPHOS 54 90  BILITOT 0.8 0.6  PROT 5.2* 5.0*  ALBUMIN 3.0* 2.4*   No results for input(s): LIPASE, AMYLASE in the last 168 hours. No results for input(s): AMMONIA in the last 168 hours. Coagulation Profile:  Recent Labs Lab 06/13/16 0557  INR 1.20   Cardiac Enzymes:  Recent Labs Lab 06/14/16 0521 06/14/16 1315 06/15/16 1106 06/15/16 1524 06/15/16 2147  TROPONINI 0.75* 1.07* 0.66* 0.53* 0.40*   BNP (last 3 results) No results for input(s): PROBNP in the last 8760 hours. HbA1C:  Recent Labs  06/17/16 0340  HGBA1C 5.1   CBG:  Recent Labs Lab 06/14/16 0838  GLUCAP 88   Lipid Profile:  Recent Labs  06/17/16 0340  CHOL 200  HDL 39*  LDLCALC 120*  TRIG 203*  CHOLHDL 5.1   Thyroid Function Tests: No results for input(s): TSH, T4TOTAL, FREET4, T3FREE, THYROIDAB in the last 72 hours. Anemia Panel: No results for input(s): VITAMINB12, FOLATE, FERRITIN, TIBC, IRON, RETICCTPCT in the last 72 hours. Sepsis Labs:  Recent Labs Lab 06/13/16 0156 06/13/16 0424 06/13/16 0557 06/13/16 0600 06/13/16 1054 06/14/16 0521 06/15/16 0747  PROCALCITON  --   --  30.85  --   --  42.20 28.79  LATICACIDVEN 2.91* 1.50  --  1.3 1.6  --   --     Recent Results (from the past 240 hour(s))  Blood Culture (routine x 2)     Status: Abnormal   Collection Time: 06/13/16  1:28 AM  Result Value Ref Range Status   Specimen Description  BLOOD LEFT ARM  Final   Special Requests BOTTLES DRAWN AEROBIC AND ANAEROBIC  Final   Culture  Setup Time   Final    GRAM NEGATIVE RODS IN BOTH AEROBIC AND ANAEROBIC BOTTLES CRITICAL RESULT CALLED TO, READ BACK BY AND VERIFIED WITH: T.EGAN, PHARMD AT1720 ON 06/13/16 BY C. JESSUP, MLT.    Culture (A)  Final    ESCHERICHIA COLI SUSCEPTIBILITIES PERFORMED ON PREVIOUS CULTURE WITHIN THE LAST 5 DAYS.    Report Status 06/16/2016 FINAL   Final   Organism ID, Bacteria ESCHERICHIA COLI  Final      Susceptibility   Escherichia coli - MIC*    AMPICILLIN <=2 SENSITIVE Sensitive     CEFAZOLIN <=4 SENSITIVE Sensitive     CEFEPIME <=1 SENSITIVE Sensitive     CEFTAZIDIME <=1 SENSITIVE Sensitive     CEFTRIAXONE <=1 SENSITIVE Sensitive     CIPROFLOXACIN <=0.25 SENSITIVE Sensitive     GENTAMICIN <=1 SENSITIVE Sensitive     IMIPENEM <=0.25 SENSITIVE Sensitive     TRIMETH/SULFA <=20 SENSITIVE Sensitive     AMPICILLIN/SULBACTAM <=2 SENSITIVE Sensitive     PIP/TAZO <=4 SENSITIVE Sensitive     Extended ESBL NEGATIVE Sensitive     * ESCHERICHIA COLI  Blood Culture ID Panel (Reflexed)     Status: Abnormal   Collection Time: 06/13/16  1:28 AM  Result Value Ref Range Status   Enterococcus species NOT DETECTED NOT DETECTED Final   Listeria monocytogenes NOT DETECTED NOT DETECTED Final   Staphylococcus species NOT DETECTED NOT DETECTED Final   Staphylococcus aureus NOT DETECTED NOT DETECTED Final   Streptococcus species NOT DETECTED NOT DETECTED Final   Streptococcus agalactiae NOT DETECTED NOT DETECTED Final   Streptococcus pneumoniae NOT DETECTED NOT DETECTED Final   Streptococcus pyogenes NOT DETECTED NOT DETECTED Final   Acinetobacter baumannii NOT DETECTED NOT DETECTED Final   Enterobacteriaceae species DETECTED (A) NOT DETECTED Final    Comment: CRITICAL RESULT CALLED TO, READ BACK BY AND VERIFIED WITH: T. EGAN, PHARMD AT 1720 ON 06/13/16 BY C. JESSUP, MLT.    Enterobacter cloacae complex NOT DETECTED NOT DETECTED Final   Escherichia coli DETECTED (A) NOT DETECTED Final    Comment: CRITICAL RESULT CALLED TO, READ BACK BY AND VERIFIED WITH: T. EGAN, PHARMD AT 1720 ON 06/13/16 BY C. JESSUP, MLT.    Klebsiella oxytoca NOT DETECTED NOT DETECTED Final   Klebsiella pneumoniae NOT DETECTED NOT DETECTED Final   Proteus species NOT DETECTED NOT DETECTED Final   Serratia marcescens NOT DETECTED NOT DETECTED Final   Carbapenem  resistance NOT DETECTED NOT DETECTED Final   Haemophilus influenzae NOT DETECTED NOT DETECTED Final   Neisseria meningitidis NOT DETECTED NOT DETECTED Final   Pseudomonas aeruginosa NOT DETECTED NOT DETECTED Final   Candida albicans NOT DETECTED NOT DETECTED Final   Candida glabrata NOT DETECTED NOT DETECTED Final   Candida krusei NOT DETECTED NOT DETECTED Final   Candida parapsilosis NOT DETECTED NOT DETECTED Final   Candida tropicalis NOT DETECTED NOT DETECTED Final  Urine culture     Status: Abnormal   Collection Time: 06/13/16  1:55 AM  Result Value Ref Range Status   Specimen Description URINE, RANDOM  Final   Special Requests NONE  Final   Culture >=100,000 COLONIES/mL ESCHERICHIA COLI (A)  Final   Report Status 06/15/2016 FINAL  Final   Organism ID, Bacteria ESCHERICHIA COLI (A)  Final      Susceptibility   Escherichia coli - MIC*    AMPICILLIN <=2  SENSITIVE Sensitive     CEFAZOLIN <=4 SENSITIVE Sensitive     CEFTRIAXONE <=1 SENSITIVE Sensitive     CIPROFLOXACIN <=0.25 SENSITIVE Sensitive     GENTAMICIN <=1 SENSITIVE Sensitive     IMIPENEM <=0.25 SENSITIVE Sensitive     NITROFURANTOIN <=16 SENSITIVE Sensitive     TRIMETH/SULFA <=20 SENSITIVE Sensitive     AMPICILLIN/SULBACTAM <=2 SENSITIVE Sensitive     PIP/TAZO <=4 SENSITIVE Sensitive     Extended ESBL NEGATIVE Sensitive     * >=100,000 COLONIES/mL ESCHERICHIA COLI  Blood Culture (routine x 2)     Status: Abnormal   Collection Time: 06/13/16  2:15 AM  Result Value Ref Range Status   Specimen Description BLOOD LEFT WRIST  Final   Special Requests IN PEDIATRIC BOTTLE  Final   Culture  Setup Time   Final    GRAM NEGATIVE RODS AEROBIC BOTTLE ONLY CRITICAL RESULT CALLED TO, READ BACK BY AND VERIFIED WITH: T. EGAN, PHARMD AT 1720 ON 06/13/16 BY C. JESSUP, MLT.    Culture (A)  Final    ESCHERICHIA COLI SUSCEPTIBILITIES PERFORMED ON PREVIOUS CULTURE WITHIN THE LAST 5 DAYS.    Report Status 06/15/2016 FINAL  Final    Respiratory Panel by PCR     Status: None   Collection Time: 06/13/16  9:19 AM  Result Value Ref Range Status   Adenovirus NOT DETECTED NOT DETECTED Final   Coronavirus 229E NOT DETECTED NOT DETECTED Final   Coronavirus HKU1 NOT DETECTED NOT DETECTED Final   Coronavirus NL63 NOT DETECTED NOT DETECTED Final   Coronavirus OC43 NOT DETECTED NOT DETECTED Final   Metapneumovirus NOT DETECTED NOT DETECTED Final   Rhinovirus / Enterovirus NOT DETECTED NOT DETECTED Final   Influenza A NOT DETECTED NOT DETECTED Final   Influenza B NOT DETECTED NOT DETECTED Final   Parainfluenza Virus 1 NOT DETECTED NOT DETECTED Final   Parainfluenza Virus 2 NOT DETECTED NOT DETECTED Final   Parainfluenza Virus 3 NOT DETECTED NOT DETECTED Final   Parainfluenza Virus 4 NOT DETECTED NOT DETECTED Final   Respiratory Syncytial Virus NOT DETECTED NOT DETECTED Final   Bordetella pertussis NOT DETECTED NOT DETECTED Final   Chlamydophila pneumoniae NOT DETECTED NOT DETECTED Final   Mycoplasma pneumoniae NOT DETECTED NOT DETECTED Final  Culture, blood (routine x 2)     Status: None (Preliminary result)   Collection Time: 06/17/16  6:07 PM  Result Value Ref Range Status   Specimen Description BLOOD LEFT ANTECUBITAL  Final   Special Requests BOTTLES DRAWN AEROBIC AND ANAEROBIC 5CC  Final   Culture NO GROWTH < 24 HOURS  Final   Report Status PENDING  Incomplete  Culture, blood (routine x 2)     Status: None (Preliminary result)   Collection Time: 06/17/16  6:18 PM  Result Value Ref Range Status   Specimen Description BLOOD LEFT HAND  Final   Special Requests BOTTLES DRAWN AEROBIC AND ANAEROBIC 5CC  Final   Culture NO GROWTH < 24 HOURS  Final   Report Status PENDING  Incomplete         Radiology Studies: Dg Chest 2 View  Result Date: 06/17/2016 CLINICAL DATA:  Followup pleural effusion. EXAM: CHEST  2 VIEW COMPARISON:  06/15/2016 FINDINGS: Normal heart size. Bilateral pleural effusions are identified which  appear stable to decreased in volume from previous exam. Interval improvement in pulmonary edema. IMPRESSION: Improving pulmonary edema with stable to decrease in volume of pleural effusions. Electronically Signed   By: Signa Kell  M.D.   On: 06/17/2016 09:04        Scheduled Meds: . amLODipine  5 mg Oral Daily  . aspirin EC  81 mg Oral Daily  . cefTRIAXone (ROCEPHIN)  IV  2 g Intravenous Q12H  . feeding supplement (ENSURE ENLIVE)  237 mL Oral BID BM  . metoprolol tartrate  25 mg Oral BID  . omega-3 acid ethyl esters  1 g Oral Daily  . pantoprazole  40 mg Intravenous Q12H  . polyethylene glycol  17 g Oral BID  . senna-docusate  1 tablet Oral BID  . simvastatin  20 mg Oral q1800  . sodium chloride  2,000 mL Intravenous Once   Continuous Infusions:     LOS: 5 days    Time spent:35 minutes.     Alba Cory, MD [facility] Pager [phone]  If 7PM-7AM, please contact night-coverage [email or web address] Password [email or web address] 06/18/2016, 1:20 PM

## 2016-06-18 NOTE — Progress Notes (Signed)
Carotid Duplex Completed. No evidence of stenosis noted in bilateral carotid arteries. Vertebral arteries demonstrated antegrade flow. [name], BS, RDMS, RVT

## 2016-06-18 NOTE — Progress Notes (Signed)
STROKE TEAM PROGRESS NOTE   HISTORY OF PRESENT ILLNESS (per record) [name] is an 80 y.o. female brought to hospital with UGIB and GNR bacteremia. Patient has had encephalopathy since admission. MRI was obtained for Delirium while in hospital and revealed a right PCA infarct. MRA showed Occlusion of the distal right PCA. Note is made of a fetal type right PCA. Along with  Atheromatous irregularity with severe tandem nonocclusive the stenoses within the left PCA, Severe nonocclusive left A2 stenosis. Neurology was asked to see due to MRI findings.   Currently she is Afebrile with WBC 18.1--down from 21.2.   Currently patient has not confused and feels back to baseline. Daughter at bedside also concurs that she is at baseline. In discussing her recent stroke, patient has no idea when this might of happened as she does not feel that she had any symptoms.   Date last known well: Unable to determine Time last known well: Unable to determine tPA Given: No: no LSN   SUBJECTIVE (INTERVAL HISTORY) Her daughter and husband are at the bedside.  Overall she feels her condition is completely resolved. However, due to bacteremia and stroke, we have to rule out endocarditis for treatment purpose. Pt is in agreement.    OBJECTIVE Temp:  [98 F (36.7 C)-98.4 F (36.9 C)] 98 F (36.7 C) (09/16 0522) Pulse Rate:  [67-73] 67 (09/16 0522) Cardiac Rhythm: Normal sinus rhythm (09/16 0700) Resp:  [17-18] 18 (09/16 0522) BP: (148-174)/(56-74) 174/74 (09/16 0522) SpO2:  [94 %-97 %] 96 % (09/16 0522) Weight:  [107 lb 12.8 oz (48.9 kg)] 107 lb 12.8 oz (48.9 kg) (09/16 0522)  CBC:  Recent Labs Lab 06/13/16 0153  06/16/16 0437 06/17/16 0340  WBC 1.0*  < > 18.1* 11.7*  NEUTROABS 0.7*  --  16.4*  --   HGB 8.2*  < > 12.5 13.1  HCT 25.4*  < > 37.5 39.7  MCV 95.5  < > 92.6 92.1  PLT 209  < > 265 246  < > = values in this interval not displayed.  Basic Metabolic Panel:  Recent Labs Lab  06/14/16 0521 06/15/16 0747  06/17/16 0340 06/18/16 0305  NA 142 141  < > 143 140  K 4.5 3.4*  < > 3.3* 4.0  CL 117* 117*  < > 104 104  CO2 16* 17*  < > 29 30  GLUCOSE 100* 154*  < > 96 107*  BUN 19 28*  < > 19 16  CREATININE 1.22* 1.13*  < > 1.03* 1.02*  CALCIUM 8.4* 8.8*  < > 9.0 9.2  MG 1.9 1.7  --   --   --   PHOS 3.5  --   --   --   --   < > = values in this interval not displayed.  Lipid Panel:     Component Value Date/Time   CHOL 200 06/17/2016 0340   TRIG 203 (H) 06/17/2016 0340   HDL 39 (L) 06/17/2016 0340   CHOLHDL 5.1 06/17/2016 0340   VLDL 41 (H) 06/17/2016 0340   LDLCALC 120 (H) 06/17/2016 0340   HgbA1c:  Lab Results  Component Value Date   HGBA1C 5.1 06/17/2016   Urine Drug Screen: No results found for: LABOPIA, COCAINSCRNUR, LABBENZ, AMPHETMU, THCU, LABBARB    IMAGING I have personally reviewed the radiological images below and agree with the radiology interpretations.  Ct Head Wo Contrast 06/15/2016 1. No definite acute intracranial abnormalities.  2. There is a new densely  calcified plaque in the right vertebral artery (as above) compared to prior study from 12/08/2009. Although not assessable on today's noncontrast CT examination, the appearance could suggest a hemodynamically significant stenosis. If there are signs or symptoms of vertebrobasilar insufficiency, further evaluation with noncontrast brain MRI/MRA could be considered.  3. Mild cerebral atrophy with mild chronic microvascular ischemic changes in the cerebral white matter, as above.   [name] Head Wo Contrast  06/16/2016  MRI HEAD  1. Acute/early subacute ischemic nonhemorrhagic right PCA territory infarcts.  2. Moderate chronic microvascular ischemic disease.   MRA HEAD  1. Occlusion of the distal right PCA. Note is made of a fetal type right PCA.  2. Atheromatous irregularity with severe tandem nonocclusive the stenoses within the left PCA.  3. Severe nonocclusive left A2 stenosis.   4. Additional more mild atheromatous disease involving the anterior posterior circulation as above.   Transthoracic echocardiogram 06/15/2016 Study Conclusions - Left ventricle: The cavity size was normal. Wall thickness was   normal. Systolic function was normal. The estimated ejection   fraction was in the range of 60% to 65%. Wall motion was normal;   there were no regional wall motion abnormalities. Doppler   parameters are consistent with abnormal left ventricular   relaxation (grade 1 diastolic dysfunction). - Mitral valve: There was mild regurgitation. - Pulmonary arteries: Systolic pressure was mildly increased. PA   peak pressure: 35 mm Hg (S). - Pericardium, extracardiac: There was a left pleural effusion.  CUS pending    PHYSICAL EXAM HEENT-  Normocephalic, no lesions, without obvious abnormality.  Normal external eye and conjunctiva.  Normal TM's bilaterally.  Normal auditory canals and external ears. Normal external nose, mucus membranes and septum.  Normal pharynx. Cardiovascular- S1, S2 normal, pulses palpable throughout   Lungs- chest clear, no wheezing, rales, normal symmetric air entry Abdomen- normal findings: bowel sounds normal Extremities- no edema Lymph-no adenopathy palpable Musculoskeletal-no joint tenderness, deformity or swelling Skin-warm and dry, no hyperpigmentation, vitiligo, or suspicious lesions  Neurological Examination Mental Status: Alert, oriented, thought content appropriate.  Speech fluent without evidence of aphasia.  Able to follow 3 step commands without difficulty.diminished recall 1/3 only. Cranial Nerves: II: Discs flat bilaterally; Visual fields grossly normal, pupils equal, round, reactive to light and accommodation III,IV, VI: ptosis not present, extra-ocular motions intact bilaterally. Partial left superior quadrantanopsia V,VII: smile symmetric, facial light touch sensation normal bilaterally VIII: hearing normal  bilaterally IX,X: uvula rises symmetrically XI: bilateral shoulder shrug XII: midline tongue extension Motor: Right :  Upper extremity   5/5                                      Left:     Upper extremity   5/5             Lower extremity   5/5                                                  Lower extremity   5/5 Tone and bulk:normal tone throughout; no atrophy noted Sensory: Pinprick and light touch intact throughout, bilaterally Deep Tendon Reflexes: 2+ and symmetric throughout Plantars: Right: downgoing  Left: downgoing Cerebellar: normal finger-to-nose and normal heel-to-shin test Gait: normal gait and station   ASSESSMENT/PLAN  Ms. Jameriah Heldnn V Fritze is a 80 y.o. female with history of hypertension, chronic kidney disease, thyroid disease, recent upper GI bleed and gram negative rods bacteremia presenting with altered mental status. She did not receive IV t-PA due to unknown time of onset and upper GI bleed.   Stroke:  Right PCA scattered infarcts, embolic secondary to an unknown source but endocarditis needs to be ruled out.  Resultant  No neuro deficit  MRI - Acute/early subacute scattered right PCA territory infarcts.   MRA - Occlusion of the distal right PCA. Left PCA high grade stenosis. Severe nonocclusive left A2 stenosis. Bilateral MCA atherosclerosis. No mycotic aneurysm seen.  Carotid Doppler - no significant extracranial stenosis.  2D Echo - EF 60-65%. No cardiac source of emboli identified.  LDL- 120  HgbA1c  5.1  VTE prophylaxis - SCDs Diet regular Room service appropriate? Yes; Fluid consistency: Thin Diet NPO time specified Except for: Sips with Meds  No antithrombotic prior to admission, now on aspirin 81 mg daily given no mycotic aneurysm seen.   Patient counseled to be compliant with her antithrombotic medications  Ongoing aggressive stroke risk factor management  Therapy recommendations: No PT or OT follow-up  recommended  Disposition: pending  Bacteremia  Blood culture showed Escherichia coli  On imipenem  ID on board, agree with TEE  Due to right PCA stroke, endocarditis needs to be ruled out which could potentially change antibiotics course  Recommend TEE on Monday, patient is in agreement  Upper GI bleeding  Hb 6.8 -> 13.1  GI okay with aspirin 81  No more bleeding  Hypertension  Blood pressures running mildly high.  Permissive hypertension (OK if < 220/120) but gradually normalize in 5-7 days  Long-term BP goal normotensive  Hyperlipidemia  Home meds:  Lovaza 1 gm daily.  LDL 120, goal < 70  Now on Zocor 20 mg daily.  Continue statin at discharge  Other Stroke Risk Factors  Advanced age  Family hx stroke (Mother)  Other Active Problems  Leukocytosis - 18.1 -> 11.7  Hypokalemia - 3.3   (supplemented)  Chronic kidney disease - creatinine 1.03  Hospital day # 5 I have personally examined this patient, reviewed notes, independently viewed imaging studies, participated in medical decision making and plan of care.ROS completed by me personally and pertinent positives fully documented  I have made any additions or clarifications directly to the above note. Long discussion at the bedside with the patient and family and answered questions. Greater than 50% time during this 25 minute visit was spent on counseling and coordination of care about stroke risk and endocarditis  Delia HeadyPramod Leiah Giannotti, MD Medical Director [facility] Pager: [phone] 06/18/2016 1:40 PM  Delia HeadyPramod Zan Orlick, MD Stroke Neurology 06/18/2016 1:37 PM   To contact Stroke Continuity provider, please refer to [email or web address]. After hours, contact General Neurology

## 2016-06-19 DIAGNOSIS — R410 Disorientation, unspecified: Secondary | ICD-10-CM

## 2016-06-19 LAB — CBC
HEMATOCRIT: 37.3 % (ref 36.0–46.0)
Hemoglobin: 12 g/dL (ref 12.0–15.0)
MCH: 30.5 pg (ref 26.0–34.0)
MCHC: 32.2 g/dL (ref 30.0–36.0)
MCV: 94.7 fL (ref 78.0–100.0)
Platelets: 234 10*3/uL (ref 150–400)
RBC: 3.94 MIL/uL (ref 3.87–5.11)
RDW: 14.6 % (ref 11.5–15.5)
WBC: 8.3 10*3/uL (ref 4.0–10.5)

## 2016-06-19 LAB — BASIC METABOLIC PANEL
ANION GAP: 7 (ref 5–15)
BUN: 22 mg/dL — ABNORMAL HIGH (ref 6–20)
CALCIUM: 9.6 mg/dL (ref 8.9–10.3)
CO2: 28 mmol/L (ref 22–32)
Chloride: 105 mmol/L (ref 101–111)
Creatinine, Ser: 0.94 mg/dL (ref 0.44–1.00)
GFR, EST NON AFRICAN AMERICAN: 57 mL/min — AB (ref >60)
GLUCOSE: 102 mg/dL — AB (ref 65–99)
POTASSIUM: 4.1 mmol/L (ref 3.5–5.1)
Sodium: 140 mmol/L (ref 135–145)

## 2016-06-19 NOTE — Progress Notes (Signed)
   [facility] has been requested to perform a transesophageal echocardiogram on [name] for evaluation of bacteremia.  After careful review of history and examination, the risks and benefits of transesophageal echocardiogram have been explained including risks of esophageal damage, perforation (1:10,000 risk), bleeding, pharyngeal hematoma as well as other potential complications associated with conscious sedation including aspiration, arrhythmia, respiratory failure and death. Alternatives to treatment were discussed, questions were answered. Patient is willing to proceed.   Tereso NewcomerScott Abdulkarim Eberlin, PA-C  06/19/2016 5:28 PM

## 2016-06-19 NOTE — Progress Notes (Signed)
STROKE TEAM PROGRESS NOTE   HISTORY OF PRESENT ILLNESS (per record) [name] is an 80 y.o. female brought to hospital with UGIB and GNR bacteremia. Patient has had encephalopathy since admission. MRI was obtained for Delirium while in hospital and revealed a right PCA infarct. MRA showed Occlusion of the distal right PCA. Note is made of a fetal type right PCA. Along with  Atheromatous irregularity with severe tandem nonocclusive the stenoses within the left PCA, Severe nonocclusive left A2 stenosis. Neurology was asked to see due to MRI findings.   Currently she is Afebrile with WBC 18.1--down from 21.2.   Currently patient has not confused and feels back to baseline. Daughter at bedside also concurs that she is at baseline. In discussing her recent stroke, patient has no idea when this might of happened as she does not feel that she had any symptoms.   Date last known well: Unable to determine Time last known well: Unable to determine tPA Given: No: no LSN   SUBJECTIVE (INTERVAL HISTORY) Her family is not  at the bedside.  Overall she feels her condition is completely resolved.     OBJECTIVE Temp:  [98.2 F (36.8 C)-99.2 F (37.3 C)] 98.3 F (36.8 C) (09/17 0512) Pulse Rate:  [67-78] 67 (09/17 0512) Cardiac Rhythm: Normal sinus rhythm (09/17 0923) Resp:  [18] 18 (09/17 0512) BP: (140-171)/(61-72) 158/64 (09/17 0512) SpO2:  [95 %-99 %] 95 % (09/17 0512) Weight:  [108 lb 11.2 oz (49.3 kg)] 108 lb 11.2 oz (49.3 kg) (09/17 0512)  CBC:  Recent Labs Lab 06/13/16 0153  06/16/16 0437 06/17/16 0340 06/19/16 0316  WBC 1.0*  < > 18.1* 11.7* 8.3  NEUTROABS 0.7*  --  16.4*  --   --   HGB 8.2*  < > 12.5 13.1 12.0  HCT 25.4*  < > 37.5 39.7 37.3  MCV 95.5  < > 92.6 92.1 94.7  PLT 209  < > 265 246 234  < > = values in this interval not displayed.  Basic Metabolic Panel:  Recent Labs Lab 06/14/16 0521 06/15/16 0747  06/18/16 0305 06/19/16 0316  NA 142 141  < > 140 140   K 4.5 3.4*  < > 4.0 4.1  CL 117* 117*  < > 104 105  CO2 16* 17*  < > 30 28  GLUCOSE 100* 154*  < > 107* 102*  BUN 19 28*  < > 16 22*  CREATININE 1.22* 1.13*  < > 1.02* 0.94  CALCIUM 8.4* 8.8*  < > 9.2 9.6  MG 1.9 1.7  --   --   --   PHOS 3.5  --   --   --   --   < > = values in this interval not displayed.  Lipid Panel:     Component Value Date/Time   CHOL 200 06/17/2016 0340   TRIG 203 (H) 06/17/2016 0340   HDL 39 (L) 06/17/2016 0340   CHOLHDL 5.1 06/17/2016 0340   VLDL 41 (H) 06/17/2016 0340   LDLCALC 120 (H) 06/17/2016 0340   HgbA1c:  Lab Results  Component Value Date   HGBA1C 5.1 06/17/2016   Urine Drug Screen: No results found for: LABOPIA, COCAINSCRNUR, LABBENZ, AMPHETMU, THCU, LABBARB    IMAGING I have personally reviewed the radiological images below and agree with the radiology interpretations.  Ct Head Wo Contrast 06/15/2016 1. No definite acute intracranial abnormalities.  2. There is a new densely calcified plaque in the right vertebral artery (as  above) compared to prior study from 12/08/2009. Although not assessable on today's noncontrast CT examination, the appearance could suggest a hemodynamically significant stenosis. If there are signs or symptoms of vertebrobasilar insufficiency, further evaluation with noncontrast brain MRI/MRA could be considered.  3. Mild cerebral atrophy with mild chronic microvascular ischemic changes in the cerebral white matter, as above.   Mr [name] Head Wo Contrast  06/16/2016  MRI HEAD  1. Acute/early subacute ischemic nonhemorrhagic right PCA territory infarcts.  2. Moderate chronic microvascular ischemic disease.   MRA HEAD  1. Occlusion of the distal right PCA. Note is made of a fetal type right PCA.  2. Atheromatous irregularity with severe tandem nonocclusive the stenoses within the left PCA.  3. Severe nonocclusive left A2 stenosis.  4. Additional more mild atheromatous disease involving the anterior posterior  circulation as above.   Transthoracic echocardiogram 06/15/2016 Study Conclusions - Left ventricle: The cavity size was normal. Wall thickness was   normal. Systolic function was normal. The estimated ejection   fraction was in the range of 60% to 65%. Wall motion was normal;   there were no regional wall motion abnormalities. Doppler   parameters are consistent with abnormal left ventricular   relaxation (grade 1 diastolic dysfunction). - Mitral valve: There was mild regurgitation. - Pulmonary arteries: Systolic pressure was mildly increased. PA   peak pressure: 35 mm Hg (S). - Pericardium, extracardiac: There was a left pleural effusion.  CUS No evidence of stenosis noted in bilateral carotid arteries. Vertebral arteries demonstrated antegrade flow  PHYSICAL EXAM HEENT-  Normocephalic, no lesions, without obvious abnormality.  Normal external eye and conjunctiva.  Normal TM's bilaterally.  Normal auditory canals and external ears. Normal external nose, mucus membranes and septum.  Normal pharynx. Cardiovascular- S1, S2 normal, pulses palpable throughout   Lungs- chest clear, no wheezing, rales, normal symmetric air entry Abdomen- normal findings: bowel sounds normal Extremities- no edema Lymph-no adenopathy palpable Musculoskeletal-no joint tenderness, deformity or swelling Skin-warm and dry, no hyperpigmentation, vitiligo, or suspicious lesions  Neurological Examination Mental Status: Alert, oriented, thought content appropriate.  Speech fluent without evidence of aphasia.  Able to follow 3 step commands without difficulty.diminished recall 1/3 only. Cranial Nerves: II: Discs flat bilaterally; Visual fields grossly normal, pupils equal, round, reactive to light and accommodation III,IV, VI: ptosis not present, extra-ocular motions intact bilaterally. Partial left superior quadrantanopsia V,VII: smile symmetric, facial light touch sensation normal bilaterally VIII: hearing  normal bilaterally IX,X: uvula rises symmetrically XI: bilateral shoulder shrug XII: midline tongue extension Motor: Right :  Upper extremity   5/5                                      Left:     Upper extremity   5/5             Lower extremity   5/5                                                  Lower extremity   5/5 Tone and bulk:normal tone throughout; no atrophy noted Sensory: Pinprick and light touch intact throughout, bilaterally Deep Tendon Reflexes: 2+ and symmetric throughout Plantars: Right: downgoing  Left: downgoing Cerebellar: normal finger-to-nose and normal heel-to-shin test Gait: normal gait and station   ASSESSMENT/PLAN  Ms. Shamaria Heldnn V Zajkowski is a 80 y.o. female with history of hypertension, chronic kidney disease, thyroid disease, recent upper GI bleed and gram negative rods bacteremia presenting with altered mental status. She did not receive IV t-PA due to unknown time of onset and upper GI bleed.   Stroke:  Right PCA scattered infarcts, embolic secondary to an unknown source but endocarditis needs to be ruled out.  Resultant  No neuro deficit  MRI - Acute/early subacute scattered right PCA territory infarcts.   MRA - Occlusion of the distal right PCA. Left PCA high grade stenosis. Severe nonocclusive left A2 stenosis. Bilateral MCA atherosclerosis. No mycotic aneurysm seen.  Carotid Doppler - no significant extracranial stenosis.  2D Echo - EF 60-65%. No cardiac source of emboli identified.  LDL- 120  HgbA1c  5.1  VTE prophylaxis - SCDs Diet regular Room service appropriate? Yes; Fluid consistency: Thin Diet NPO time specified Except for: Sips with Meds  No antithrombotic prior to admission, now on aspirin 81 mg daily given no mycotic aneurysm seen.   Patient counseled to be compliant with her antithrombotic medications  Ongoing aggressive stroke risk factor management  Therapy recommendations: No PT or OT follow-up  recommended  Disposition: pending  Bacteremia  Blood culture showed Escherichia coli  On imipenem  ID on board, agree with TEE  Due to right PCA stroke, endocarditis needs to be ruled out which could potentially change antibiotics course  Recommend TEE on Monday, patient is in agreement  Upper GI bleeding  Hb 6.8 -> 13.1  GI okay with aspirin 81  No more bleeding  Hypertension  Blood pressures running mildly high.  Permissive hypertension (OK if < 220/120) but gradually normalize in 5-7 days  Long-term BP goal normotensive  Hyperlipidemia  Home meds:  Lovaza 1 gm daily.  LDL 120, goal < 70  Now on Zocor 20 mg daily.  Continue statin at discharge  Other Stroke Risk Factors  Advanced age  Family hx stroke (Mother)  Other Active Problems  Leukocytosis - 18.1 -> 11.7  Hypokalemia - 3.3   (supplemented)  Chronic kidney disease - creatinine 1.03  Hospital day # 6 I have personally examined this patient, reviewed notes, independently viewed imaging studies, participated in medical decision making and plan of care.ROS completed by me personally and pertinent positives fully documented  I have made any additions or clarifications directly to the above note.   Delia HeadyPramod Jamelia Varano, MD Medical Director [facility] Pager: [phone] 06/19/2016 1:05 PM      To contact Stroke Continuity provider, please refer to [email or web address]. After hours, contact General Neurology

## 2016-06-19 NOTE — Progress Notes (Signed)
PROGRESS NOTE    [name]  WUJ:[id number] DOB: 1935-12-23 DOA: 06/13/2016 PCP: Pearson GrippeJames Kim, MD    Brief Narrative: HISTORY OF PRESENT ILLNESS:   80 y.o. female admitted 9/11 with presumed UGIB as well as shock w/ GNR (ECOLI) bacteremia. EGD showed findings c/w NSAID (9/12) related gastritis. I suspect that the bacteremia was d/t bacterial translocation from gut vs UT source. Will dc vanc, cont imipenem until sensitivities are available. She does have some delirium s/p EGD from meds but this is rapidly clearing. For today: adv diet, cont PPI, dc vanc dc NSAIDs, & cont primaxin. Will give lasix x 1 for what is likely ALI and follow CXR, Will order ECHO to assess for wall motion abnormality although suspect that trop bump is simply r/t sepsis. She will move to tele this afternoon if stays stable.   Assessment & Plan:   Principal Problem:   Sepsis (HCC) Active Problems:   Acute renal failure superimposed on stage 3 chronic kidney disease (HCC)   Essential hypertension   CAP (community acquired pneumonia)   Nausea vomiting and diarrhea   Neutropenia (HCC)   Anxiety   UGIB (upper gastrointestinal bleed)   Hypokalemia   Leukopenia   AKI (acute kidney injury) (HCC)   Dehydration   Encounter for central line placement   Difficult intravenous access   E coli bacteremia   Iron deficiency anemia due to chronic blood loss   Embolic stroke involving right posterior cerebral artery [facility](HCC)  DISCUSSION: 80 y.o. female admitted 9/11 with presumed UGIB as well as shock w/ GNR (ECOLI) bacteremia. EGD showed findings c/w NSAID (9/12) related gastritis. I suspect that the bacteremia was d/t bacterial translocation from gut vs UT source. Will dc vanc, cont imipenem until sensitivities are available. She does have some delirium s/p EGD from meds but this is rapidly clearing. For today: adv diet, cont PPI, dc vanc dc NSAIDs, & cont primaxin. Will give lasix x 1 for what is likely ALI and follow CXR, Will  order ECHO to assess for wall motion abnormality although suspect that trop bump is simply r/t sepsis. She will move to tele this afternoon if stays stable.    Hemorrhagic and septic shock-->resolved.  Hx HTN.  Mild troponin bump in setting of sepsis, trending down, ECHO with Normal EF Goal MAP > 65.  E coli.  bacteremia -->ecoli almost certainly from gut translocation: wbc rising (likely from steroids) PCT rising but should level out and start to drop soon.  Cont PCT algo  Dc vanc. Continue with ceftriaxone, plan for TEE on Monday , need to be careful with sedatives.  Repeated Blood cultures; 9-16 no growth to date.   PNA, pleural effusion;  IV lasix today. Repeat chest x ray 9-15.  WBC trending down, pleural effusion decreasing.  Change antibiotics to ceftriaxone.   Acute delirium-->d/t sedating meds for EGD Cont supportive care  Frequent re-orientation  Avoid IV benzos.   Acute stroke;  MRI with acute-subacute stroke, occlusion of Right PCA.  Neurology consulted, and recommending TEE. Discussed with Dr Elnoria HowardHung, ok to use baby aspirin. If we are thinking Endocarditis will need to discontinue aspirin.  Plan for TEE on Monday to rule out Endocarditis.  On statins.   HTN Continue with  norvasc to 5 mg daily   Acute blood loss anemia - from UGIB (NSAID related gastritis) Endoscopy: Normal esophagus.- Non-bleeding gastric ulcers with no stigmata of bleeding. Biopsied. - Multiple non-bleeding duodenal ulcers with no stigmata of bleeding. Cont daily  cbc SCD's only. Dc asa NO NSAIDS F/u post-surgical path continue with protonix.    AKI-->improved Left renal cyst-->no hydro.  NAG metabolic acidosis in setting of hyperchloremia from volume resuscitation  Hold IV fluids due to pleural effusion.  Improved.   Acute hypoxic respiratory failure. CAP vs ALI --> ALI in setting of bacteremia  Continue supplemental O2 as needed to maintain SpO2 > 92% received IV lasix 9-13. Will  repeat dose today.  Weight 55--51.  Repeat chest x ray 9-15.   Hx hypothyroidism. Adrenal insuff tsh ok; not on rx  stress dose steroids discontinue  9-14.   Constipation; Miralax ordered. Had BM    DVT prophylaxis: scd Code Status: full code.  Family Communication: family updated twice  Disposition Plan: home when Work up for stroke completed.    SIGNIFICANT EVENTS: 9/11 > admitted with presumed UGIB and shock (combo of sepsis of unclear etiology as well as hypovolemic) > initially planned for emergent EGD > aborted due to hypotension > transferred to ICU. 9/12 BC + Ecoli. Hemodynamically stable. EGD completed: NSAID related gastritis. ABX narrowed.   Consultants:   Ccm    Procedures:  ECHO; Left ventricle: The cavity size was normal. Wall thickness was   normal. Systolic function was normal. The estimated ejection   fraction was in the range of 60% to 65%. Wall motion was normal;   there were no regional wall motion abnormalities. Doppler   parameters are consistent with abnormal left ventricular   relaxation (grade 1 diastolic dysfunction). - Mitral valve: There was mild regurgitation. - Pulmonary arteries: Systolic pressure was mildly increased. PA   peak pressure: 35 mm Hg (S). - Pericardium, extracardiac: There was a left pleural effusion.     Antimicrobials: Primaxin    Subjective: Report constipation , no BM since Sunday. Mild abdominal pain.    Objective: Vitals:   06/18/16 1900 06/18/16 2008 06/18/16 2154 06/19/16 0512  BP: (!) 171/67  (!) 159/72 (!) 158/64  Pulse: 78   67  Resp: 18   18  Temp: 99.2 F (37.3 C) 98.2 F (36.8 C)  98.3 F (36.8 C)  TempSrc: Oral Oral  Oral  SpO2: 99%   95%  Weight:    49.3 kg (108 lb 11.2 oz)  Height:        Intake/Output Summary (Last 24 hours) at 06/19/16 1248 Last data filed at 06/19/16 0906  Gross per 24 hour  Intake              610 ml  Output             10 50 ml  Net             -440 ml   Filed  Weights   06/17/16 0434 06/18/16 0522 06/19/16 0512  Weight: 48.9 kg (107 lb 11.2 oz) 48.9 kg (107 lb 12.8 oz) 49.3 kg (108 lb 11.2 oz)    Examination:  General exam: Appears calm and comfortable  Respiratory system: Clear to auscultation. Respiratory effort normal. Cardiovascular system: S1 & S2 heard, RRR. No JVD, murmurs, rubs, gallops or clicks. No pedal edema. Gastrointestinal system: Abdomen is nondistended, soft and nontender. No organomegaly or masses felt. Normal bowel sounds heard. Central nervous system: Alert and oriented. No focal neurological deficits. Extremities: Symmetric 5 x 5 power. Skin: No rashes, lesions or ulcers Psychiatry: Judgement and insight appear normal. Mood & affect appropriate.     Data Reviewed: I have personally reviewed following labs and imaging studies  CBC:  Recent Labs Lab 06/13/16 0153  06/14/16 0521 06/15/16 0747 06/16/16 0437 06/17/16 0340 06/19/16 0316  WBC 1.0*  < > 21.2* 19.5* 18.1* 11.7* 8.3  NEUTROABS 0.7*  --   --   --  16.4*  --   --   HGB 8.2*  < > 11.4* 11.6* 12.5 13.1 12.0  HCT 25.4*  < > 34.6* 35.0* 37.5 39.7 37.3  MCV 95.5  < > 92.8 90.7 92.6 92.1 94.7  PLT 209  < > 178 211 265 246 234  < > = values in this interval not displayed. Basic Metabolic Panel:  Recent Labs Lab 06/13/16 0557  06/14/16 0521 06/15/16 0747 06/16/16 0437 06/17/16 0340 06/18/16 0305 06/19/16 0316  NA  --   < > 142 141 145 143 140 140  K  --   < > 4.5 3.4* 3.8 3.3* 4.0 4.1  CL  --   < > 117* 117* 111 104 104 105  CO2  --   < > 16* 17* 27 29 30 28   GLUCOSE  --   < > 100* 154* 131* 96 107* 102*  BUN  --   < > 19 28* 21* 19 16 22*  CREATININE  --   < > 1.22* 1.13* 1.09* 1.03* 1.02* 0.94  CALCIUM  --   < > 8.4* 8.8* 9.1 9.0 9.2 9.6  MG 1.1*  --  1.9 1.7  --   --   --   --   PHOS  --   --  3.5  --   --   --   --   --   < > = values in this interval not displayed. GFR: Estimated Creatinine Clearance: 35.4 mL/min (by C-G formula based on  SCr of 0.94 mg/dL). Liver Function Tests:  Recent Labs Lab 06/13/16 0153 06/15/16 0747  AST 25 49*  ALT 15 95*  ALKPHOS 54 90  BILITOT 0.8 0.6  PROT 5.2* 5.0*  ALBUMIN 3.0* 2.4*   No results for input(s): LIPASE, AMYLASE in the last 168 hours. No results for input(s): AMMONIA in the last 168 hours. Coagulation Profile:  Recent Labs Lab 06/13/16 0557  INR 1.20   Cardiac Enzymes:  Recent Labs Lab 06/14/16 0521 06/14/16 1315 06/15/16 1106 06/15/16 1524 06/15/16 2147  TROPONINI 0.75* 1.07* 0.66* 0.53* 0.40*   BNP (last 3 results) No results for input(s): PROBNP in the last 8760 hours. HbA1C:  Recent Labs  06/17/16 0340  HGBA1C 5.1   CBG:  Recent Labs Lab 06/14/16 0838  GLUCAP 88   Lipid Profile:  Recent Labs  06/17/16 0340  CHOL 200  HDL 39*  LDLCALC 120*  TRIG 203*  CHOLHDL 5.1   Thyroid Function Tests: No results for input(s): TSH, T4TOTAL, FREET4, T3FREE, THYROIDAB in the last 72 hours. Anemia Panel: No results for input(s): VITAMINB12, FOLATE, FERRITIN, TIBC, IRON, RETICCTPCT in the last 72 hours. Sepsis Labs:  Recent Labs Lab 06/13/16 0156 06/13/16 0424 06/13/16 0557 06/13/16 0600 06/13/16 1054 06/14/16 0521 06/15/16 0747  PROCALCITON  --   --  30.85  --   --  42.20 28.79  LATICACIDVEN 2.91* 1.50  --  1.3 1.6  --   --     Recent Results (from the past 240 hour(s))  Blood Culture (routine x 2)     Status: Abnormal   Collection Time: 06/13/16  1:28 AM  Result Value Ref Range Status   Specimen Description BLOOD LEFT ARM  Final   Special Requests  BOTTLES DRAWN AEROBIC AND ANAEROBIC  Final   Culture  Setup Time   Final    GRAM NEGATIVE RODS IN BOTH AEROBIC AND ANAEROBIC BOTTLES CRITICAL RESULT CALLED TO, READ BACK BY AND VERIFIED WITH: T.EGAN, PHARMD AT1720 ON 06/13/16 BY C. JESSUP, MLT.    Culture (A)  Final    ESCHERICHIA COLI SUSCEPTIBILITIES PERFORMED ON PREVIOUS CULTURE WITHIN THE LAST 5 DAYS.    Report Status  06/16/2016 FINAL  Final   Organism ID, Bacteria ESCHERICHIA COLI  Final      Susceptibility   Escherichia coli - MIC*    AMPICILLIN <=2 SENSITIVE Sensitive     CEFAZOLIN <=4 SENSITIVE Sensitive     CEFEPIME <=1 SENSITIVE Sensitive     CEFTAZIDIME <=1 SENSITIVE Sensitive     CEFTRIAXONE <=1 SENSITIVE Sensitive     CIPROFLOXACIN <=0.25 SENSITIVE Sensitive     GENTAMICIN <=1 SENSITIVE Sensitive     IMIPENEM <=0.25 SENSITIVE Sensitive     TRIMETH/SULFA <=20 SENSITIVE Sensitive     AMPICILLIN/SULBACTAM <=2 SENSITIVE Sensitive     PIP/TAZO <=4 SENSITIVE Sensitive     Extended ESBL NEGATIVE Sensitive     * ESCHERICHIA COLI  Blood Culture ID Panel (Reflexed)     Status: Abnormal   Collection Time: 06/13/16  1:28 AM  Result Value Ref Range Status   Enterococcus species NOT DETECTED NOT DETECTED Final   Listeria monocytogenes NOT DETECTED NOT DETECTED Final   Staphylococcus species NOT DETECTED NOT DETECTED Final   Staphylococcus aureus NOT DETECTED NOT DETECTED Final   Streptococcus species NOT DETECTED NOT DETECTED Final   Streptococcus agalactiae NOT DETECTED NOT DETECTED Final   Streptococcus pneumoniae NOT DETECTED NOT DETECTED Final   Streptococcus pyogenes NOT DETECTED NOT DETECTED Final   Acinetobacter baumannii NOT DETECTED NOT DETECTED Final   Enterobacteriaceae species DETECTED (A) NOT DETECTED Final    Comment: CRITICAL RESULT CALLED TO, READ BACK BY AND VERIFIED WITH: T. EGAN, PHARMD AT 1720 ON 06/13/16 BY C. JESSUP, MLT.    Enterobacter cloacae complex NOT DETECTED NOT DETECTED Final   Escherichia coli DETECTED (A) NOT DETECTED Final    Comment: CRITICAL RESULT CALLED TO, READ BACK BY AND VERIFIED WITH: T. EGAN, PHARMD AT 1720 ON 06/13/16 BY C. JESSUP, MLT.    Klebsiella oxytoca NOT DETECTED NOT DETECTED Final   Klebsiella pneumoniae NOT DETECTED NOT DETECTED Final   Proteus species NOT DETECTED NOT DETECTED Final   Serratia marcescens NOT DETECTED NOT DETECTED Final    Carbapenem resistance NOT DETECTED NOT DETECTED Final   Haemophilus influenzae NOT DETECTED NOT DETECTED Final   Neisseria meningitidis NOT DETECTED NOT DETECTED Final   Pseudomonas aeruginosa NOT DETECTED NOT DETECTED Final   Candida albicans NOT DETECTED NOT DETECTED Final   Candida glabrata NOT DETECTED NOT DETECTED Final   Candida krusei NOT DETECTED NOT DETECTED Final   Candida parapsilosis NOT DETECTED NOT DETECTED Final   Candida tropicalis NOT DETECTED NOT DETECTED Final  Urine culture     Status: Abnormal   Collection Time: 06/13/16  1:55 AM  Result Value Ref Range Status   Specimen Description URINE, RANDOM  Final   Special Requests NONE  Final   Culture >=100,000 COLONIES/mL ESCHERICHIA COLI (A)  Final   Report Status 06/15/2016 FINAL  Final   Organism ID, Bacteria ESCHERICHIA COLI (A)  Final      Susceptibility   Escherichia coli - MIC*    AMPICILLIN <=2 SENSITIVE Sensitive     CEFAZOLIN <=4 SENSITIVE  Sensitive     CEFTRIAXONE <=1 SENSITIVE Sensitive     CIPROFLOXACIN <=0.25 SENSITIVE Sensitive     GENTAMICIN <=1 SENSITIVE Sensitive     IMIPENEM <=0.25 SENSITIVE Sensitive     NITROFURANTOIN <=16 SENSITIVE Sensitive     TRIMETH/SULFA <=20 SENSITIVE Sensitive     AMPICILLIN/SULBACTAM <=2 SENSITIVE Sensitive     PIP/TAZO <=4 SENSITIVE Sensitive     Extended ESBL NEGATIVE Sensitive     * >=100,000 COLONIES/mL ESCHERICHIA COLI  Blood Culture (routine x 2)     Status: Abnormal   Collection Time: 06/13/16  2:15 AM  Result Value Ref Range Status   Specimen Description BLOOD LEFT WRIST  Final   Special Requests IN PEDIATRIC BOTTLE  Final   Culture  Setup Time   Final    GRAM NEGATIVE RODS AEROBIC BOTTLE ONLY CRITICAL RESULT CALLED TO, READ BACK BY AND VERIFIED WITH: T. EGAN, PHARMD AT 1720 ON 06/13/16 BY C. JESSUP, MLT.    Culture (A)  Final    ESCHERICHIA COLI SUSCEPTIBILITIES PERFORMED ON PREVIOUS CULTURE WITHIN THE LAST 5 DAYS.    Report Status 06/15/2016 FINAL   Final  Respiratory Panel by PCR     Status: None   Collection Time: 06/13/16  9:19 AM  Result Value Ref Range Status   Adenovirus NOT DETECTED NOT DETECTED Final   Coronavirus 229E NOT DETECTED NOT DETECTED Final   Coronavirus HKU1 NOT DETECTED NOT DETECTED Final   Coronavirus NL63 NOT DETECTED NOT DETECTED Final   Coronavirus OC43 NOT DETECTED NOT DETECTED Final   Metapneumovirus NOT DETECTED NOT DETECTED Final   Rhinovirus / Enterovirus NOT DETECTED NOT DETECTED Final   Influenza A NOT DETECTED NOT DETECTED Final   Influenza B NOT DETECTED NOT DETECTED Final   Parainfluenza Virus 1 NOT DETECTED NOT DETECTED Final   Parainfluenza Virus 2 NOT DETECTED NOT DETECTED Final   Parainfluenza Virus 3 NOT DETECTED NOT DETECTED Final   Parainfluenza Virus 4 NOT DETECTED NOT DETECTED Final   Respiratory Syncytial Virus NOT DETECTED NOT DETECTED Final   Bordetella pertussis NOT DETECTED NOT DETECTED Final   Chlamydophila pneumoniae NOT DETECTED NOT DETECTED Final   Mycoplasma pneumoniae NOT DETECTED NOT DETECTED Final  Culture, blood (routine x 2)     Status: None (Preliminary result)   Collection Time: 06/17/16  6:07 PM  Result Value Ref Range Status   Specimen Description BLOOD LEFT ANTECUBITAL  Final   Special Requests BOTTLES DRAWN AEROBIC AND ANAEROBIC 5CC  Final   Culture NO GROWTH 2 DAYS  Final   Report Status PENDING  Incomplete  Culture, blood (routine x 2)     Status: None (Preliminary result)   Collection Time: 06/17/16  6:18 PM  Result Value Ref Range Status   Specimen Description BLOOD LEFT HAND  Final   Special Requests BOTTLES DRAWN AEROBIC AND ANAEROBIC 5CC  Final   Culture NO GROWTH 2 DAYS  Final   Report Status PENDING  Incomplete         Radiology Studies: No results found.      Scheduled Meds: . amLODipine  5 mg Oral Daily  . aspirin EC  81 mg Oral Daily  . cefTRIAXone (ROCEPHIN)  IV  2 g Intravenous Q12H  . feeding supplement (ENSURE ENLIVE)  237 mL Oral  BID BM  . metoprolol tartrate  25 mg Oral BID  . omega-3 acid ethyl esters  1 g Oral Daily  . pantoprazole  40 mg Intravenous Q12H  .  polyethylene glycol  17 g Oral BID  . senna-docusate  1 tablet Oral BID  . simvastatin  20 mg Oral q1800  . sodium chloride  2,000 mL Intravenous Once   Continuous Infusions:     LOS: 6 days    Time spent:35 minutes.     Alba Cory, MD [facility] Pager [phone]  If 7PM-7AM, please contact night-coverage [email or web address] Password [facility] 06/19/2016, 12:48 PM

## 2016-06-20 DIAGNOSIS — K2900 Acute gastritis without bleeding: Secondary | ICD-10-CM

## 2016-06-20 LAB — BASIC METABOLIC PANEL
Anion gap: 9 (ref 5–15)
BUN: 19 mg/dL (ref 6–20)
CHLORIDE: 102 mmol/L (ref 101–111)
CO2: 28 mmol/L (ref 22–32)
Calcium: 10 mg/dL (ref 8.9–10.3)
Creatinine, Ser: 0.96 mg/dL (ref 0.44–1.00)
GFR calc Af Amer: >60 mL/min (ref >60)
GFR calc non Af Amer: 55 mL/min — ABNORMAL LOW (ref >60)
GLUCOSE: 101 mg/dL — AB (ref 65–99)
POTASSIUM: 4 mmol/L (ref 3.5–5.1)
Sodium: 139 mmol/L (ref 135–145)

## 2016-06-20 LAB — CBC
HCT: 40.2 % (ref 36.0–46.0)
Hemoglobin: 12.9 g/dL (ref 12.0–15.0)
MCH: 30.4 pg (ref 26.0–34.0)
MCHC: 32.1 g/dL (ref 30.0–36.0)
MCV: 94.8 fL (ref 78.0–100.0)
PLATELETS: 245 10*3/uL (ref 150–400)
RBC: 4.24 MIL/uL (ref 3.87–5.11)
RDW: 14.5 % (ref 11.5–15.5)
WBC: 9.4 10*3/uL (ref 4.0–10.5)

## 2016-06-20 MED ORDER — PANTOPRAZOLE SODIUM 40 MG PO TBEC
40.0000 mg | DELAYED_RELEASE_TABLET | Freq: Two times a day (BID) | ORAL | Status: DC
  Administered 2016-06-20 – 2016-06-21 (×2): 40 mg via ORAL
  Filled 2016-06-20 (×2): qty 1

## 2016-06-20 MED ORDER — SODIUM CHLORIDE 0.9 % IV SOLN: INTRAVENOUS | Status: DC

## 2016-06-20 NOTE — Progress Notes (Signed)
PROGRESS NOTE    [name]  ZOX:[id number] DOB: [birth year] DOA: 06/13/2016 PCP: Pearson Grippe, MD    Brief Narrative: HISTORY OF PRESENT ILLNESS:   80 y.o. female admitted 9/11 with presumed UGIB as well as shock w/ GNR (ECOLI) bacteremia. EGD showed findings c/w NSAID (9/12) related gastritis. I suspect that the bacteremia was d/t bacterial translocation from gut vs UT source. Will dc vanc, cont imipenem until sensitivities are available. She does have some delirium s/p EGD from meds but this is rapidly clearing. For today: adv diet, cont PPI, dc vanc dc NSAIDs, & cont primaxin. Will give lasix x 1 for what is likely ALI and follow CXR, Will order ECHO to assess for wall motion abnormality although suspect that trop bump is simply r/t sepsis. She will move to tele this afternoon if stays stable.   Assessment & Plan:   Principal Problem:   Sepsis (HCC) Active Problems:   Acute renal failure superimposed on stage 3 chronic kidney disease (HCC)   Essential hypertension   CAP (community acquired pneumonia)   Nausea vomiting and diarrhea   Neutropenia (HCC)   Anxiety   UGIB (upper gastrointestinal bleed)   Hypokalemia   Leukopenia   AKI (acute kidney injury) (HCC)   Dehydration   Encounter for central line placement   Difficult intravenous access   E coli bacteremia   Iron deficiency anemia due to chronic blood loss   Embolic stroke involving right posterior cerebral artery (HCC)   Confusion  DISCUSSION: 80 y.o. female admitted 9/11 with presumed UGIB as well as shock w/ GNR (ECOLI) bacteremia. EGD showed findings c/w NSAID (9/12) related gastritis. I suspect that the bacteremia was d/t bacterial translocation from gut vs UT source. Will dc vanc, cont imipenem until sensitivities are available. She does have some delirium s/p EGD from meds but this is rapidly clearing. For today: adv diet, cont PPI, dc vanc dc NSAIDs, & cont primaxin. Will give lasix x 1 for what is likely ALI and  follow CXR, Will order ECHO to assess for wall motion abnormality although suspect that trop bump is simply r/t sepsis. She will move to tele this afternoon if stays stable.    Hemorrhagic and septic shock-->resolved.  Hx HTN.  Mild troponin bump in setting of sepsis, trending down, ECHO with Normal EF Goal MAP > 65.  E coli.  bacteremia -->ecoli almost certainly from gut translocation: wbc rising (likely from steroids) PCT rising but should level out and start to drop soon.  Cont PCT algo  Dc vanc. Continue with ceftriaxone, plan for TEE  Tuesday, schedule was full for today  , need to be careful with sedatives.  Repeated Blood cultures; 9-16 no growth to date.   PNA, pleural effusion;  IV lasix today. Repeat chest x ray 9-15.  WBC trending down, pleural effusion decreasing.  Change antibiotics to ceftriaxone.   Acute delirium-->d/t sedating meds for EGD Cont supportive care  Frequent re-orientation  Avoid IV benzos.   Acute stroke;  MRI with acute-subacute stroke, occlusion of Right PCA.  Neurology consulted, and recommending TEE. Discussed with Dr [name], ok to use baby aspirin. If we are thinking Endocarditis will need to discontinue aspirin.  Awaiting TEE to rule out Endocarditis.  On statins.   HTN Continue with  norvasc to 5 mg daily   Acute blood loss anemia - from UGIB (NSAID related gastritis) Endoscopy: Normal esophagus.- Non-bleeding gastric ulcers with no stigmata of bleeding. Biopsied. - Multiple non-bleeding duodenal ulcers with  no stigmata of bleeding. Cont daily cbc SCD's only. Dc asa NO NSAIDS F/u post-surgical path continue with protonix.    AKI-->improved Left renal cyst-->no hydro.  NAG metabolic acidosis in setting of hyperchloremia from volume resuscitation  Hold IV fluids due to pleural effusion.  Improved.   Acute hypoxic respiratory failure. CAP vs ALI --> ALI in setting of bacteremia  Continue supplemental O2 as needed to maintain SpO2 >  92% received IV lasix 9-13. Will repeat dose today.  Weight 55--51.  Repeat chest x ray 9-15.   Hx hypothyroidism. Adrenal insuff tsh ok; not on rx  stress dose steroids discontinue  9-14.   Constipation; Miralax ordered. Had BM    DVT prophylaxis: scd Code Status: full code.  Family Communication: family updated twice  Disposition Plan: home when Work up for stroke completed.    SIGNIFICANT EVENTS: 9/11 > admitted with presumed UGIB and shock (combo of sepsis of unclear etiology as well as hypovolemic) > initially planned for emergent EGD > aborted due to hypotension > transferred to ICU. 9/12 BC + Ecoli. Hemodynamically stable. EGD completed: NSAID related gastritis. ABX narrowed.   Consultants:   Ccm    Procedures:  ECHO; Left ventricle: The cavity size was normal. Wall thickness was   normal. Systolic function was normal. The estimated ejection   fraction was in the range of 60% to 65%. Wall motion was normal;   there were no regional wall motion abnormalities. Doppler   parameters are consistent with abnormal left ventricular   relaxation (grade 1 diastolic dysfunction). - Mitral valve: There was mild regurgitation. - Pulmonary arteries: Systolic pressure was mildly increased. PA   peak pressure: 35 mm Hg (S). - Pericardium, extracardiac: There was a left pleural effusion.     Antimicrobials: Primaxin    Subjective: Having BM, no as black as before.  No new complaints. Awaiting TEE.    Objective: Vitals:   06/19/16 1502 06/19/16 2039 06/20/16 0455 06/20/16 0652  BP: (!) 157/63 (!) 150/64 (!) 170/93 (!) 161/76  Pulse: 74 78 72   Resp: 14 16 17    Temp: 98.2 F (36.8 C) 98.1 F (36.7 C) 98.1 F (36.7 C)   TempSrc: Oral Oral Oral   SpO2: 100% 96% 99%   Weight:   48.2 kg (106 lb 4.8 oz)   Height:        Intake/Output Summary (Last 24 hours) at 06/20/16 1216 Last data filed at 06/20/16 1100  Gross per 24 hour  Intake              320 ml  Output              1300 ml  Net             -980 ml   Filed Weights   06/18/16 0522 06/19/16 0512 06/20/16 0455  Weight: 48.9 kg (107 lb 12.8 oz) 49.3 kg (108 lb 11.2 oz) 48.2 kg (106 lb 4.8 oz)    Examination:  General exam: Appears calm and comfortable  Respiratory system: Clear to auscultation. Respiratory effort normal. Cardiovascular system: S1 & S2 heard, RRR. No JVD, murmurs, rubs, gallops or clicks. No pedal edema. Gastrointestinal system: Abdomen is nondistended, soft and nontender. No organomegaly or masses felt. Normal bowel sounds heard. Central nervous system: Alert and oriented. No focal neurological deficits. Extremities: Symmetric 5 x 5 power. Skin: No rashes, lesions or ulcers Psychiatry: Judgement and insight appear normal. Mood & affect appropriate.     Data Reviewed:  I have personally reviewed following labs and imaging studies  CBC:  Recent Labs Lab 06/15/16 0747 06/16/16 0437 06/17/16 0340 06/19/16 0316 06/20/16 0246  WBC 19.5* 18.1* 11.7* 8.3 9.4  NEUTROABS  --  16.4*  --   --   --   HGB 11.6* 12.5 13.1 12.0 12.9  HCT 35.0* 37.5 39.7 37.3 40.2  MCV 90.7 92.6 92.1 94.7 94.8  PLT 211 265 246 234 245   Basic Metabolic Panel:  Recent Labs Lab 06/14/16 0521 06/15/16 0747 06/16/16 0437 06/17/16 0340 06/18/16 0305 06/19/16 0316 06/20/16 0246  NA 142 141 145 143 140 140 139  K 4.5 3.4* 3.8 3.3* 4.0 4.1 4.0  CL 117* 117* 111 104 104 105 102  CO2 16* 17* 27 29 30 28 28   GLUCOSE 100* 154* 131* 96 107* 102* 101*  BUN 19 28* 21* 19 16 22* 19  CREATININE 1.22* 1.13* 1.09* 1.03* 1.02* 0.94 0.96  CALCIUM 8.4* 8.8* 9.1 9.0 9.2 9.6 10.0  MG 1.9 1.7  --   --   --   --   --   PHOS 3.5  --   --   --   --   --   --    GFR: Estimated Creatinine Clearance: 34.7 mL/min (by C-G formula based on SCr of 0.96 mg/dL). Liver Function Tests:  Recent Labs Lab 06/15/16 0747  AST 49*  ALT 95*  ALKPHOS 90  BILITOT 0.6  PROT 5.0*  ALBUMIN 2.4*   No results for  input(s): LIPASE, AMYLASE in the last 168 hours. No results for input(s): AMMONIA in the last 168 hours. Coagulation Profile: No results for input(s): INR, PROTIME in the last 168 hours. Cardiac Enzymes:  Recent Labs Lab 06/14/16 0521 06/14/16 1315 06/15/16 1106 06/15/16 1524 06/15/16 2147  TROPONINI 0.75* 1.07* 0.66* 0.53* 0.40*   BNP (last 3 results) No results for input(s): PROBNP in the last 8760 hours. HbA1C: No results for input(s): HGBA1C in the last 72 hours. CBG:  Recent Labs Lab 06/14/16 0838  GLUCAP 88   Lipid Profile: No results for input(s): CHOL, HDL, LDLCALC, TRIG, CHOLHDL, LDLDIRECT in the last 72 hours. Thyroid Function Tests: No results for input(s): TSH, T4TOTAL, FREET4, T3FREE, THYROIDAB in the last 72 hours. Anemia Panel: No results for input(s): VITAMINB12, FOLATE, FERRITIN, TIBC, IRON, RETICCTPCT in the last 72 hours. Sepsis Labs:  Recent Labs Lab 06/14/16 0521 06/15/16 0747  PROCALCITON 42.20 28.79    Recent Results (from the past 240 hour(s))  Blood Culture (routine x 2)     Status: Abnormal   Collection Time: 06/13/16  1:28 AM  Result Value Ref Range Status   Specimen Description BLOOD LEFT ARM  Final   Special Requests BOTTLES DRAWN AEROBIC AND ANAEROBIC  Final   Culture  Setup Time   Final    GRAM NEGATIVE RODS IN BOTH AEROBIC AND ANAEROBIC BOTTLES CRITICAL RESULT CALLED TO, READ BACK BY AND VERIFIED WITH: T.EGAN, PHARMD AT1720 ON 06/13/16 BY C. JESSUP, MLT.    Culture (A)  Final    ESCHERICHIA COLI SUSCEPTIBILITIES PERFORMED ON PREVIOUS CULTURE WITHIN THE LAST 5 DAYS.    Report Status 06/16/2016 FINAL  Final   Organism ID, Bacteria ESCHERICHIA COLI  Final      Susceptibility   Escherichia coli - MIC*    AMPICILLIN <=2 SENSITIVE Sensitive     CEFAZOLIN <=4 SENSITIVE Sensitive     CEFEPIME <=1 SENSITIVE Sensitive     CEFTAZIDIME <=1 SENSITIVE Sensitive  CEFTRIAXONE <=1 SENSITIVE Sensitive     CIPROFLOXACIN <=0.25  SENSITIVE Sensitive     GENTAMICIN <=1 SENSITIVE Sensitive     IMIPENEM <=0.25 SENSITIVE Sensitive     TRIMETH/SULFA <=20 SENSITIVE Sensitive     AMPICILLIN/SULBACTAM <=2 SENSITIVE Sensitive     PIP/TAZO <=4 SENSITIVE Sensitive     Extended ESBL NEGATIVE Sensitive     * ESCHERICHIA COLI  Blood Culture ID Panel (Reflexed)     Status: Abnormal   Collection Time: 06/13/16  1:28 AM  Result Value Ref Range Status   Enterococcus species NOT DETECTED NOT DETECTED Final   Listeria monocytogenes NOT DETECTED NOT DETECTED Final   Staphylococcus species NOT DETECTED NOT DETECTED Final   Staphylococcus aureus NOT DETECTED NOT DETECTED Final   Streptococcus species NOT DETECTED NOT DETECTED Final   Streptococcus agalactiae NOT DETECTED NOT DETECTED Final   Streptococcus pneumoniae NOT DETECTED NOT DETECTED Final   Streptococcus pyogenes NOT DETECTED NOT DETECTED Final   Acinetobacter baumannii NOT DETECTED NOT DETECTED Final   Enterobacteriaceae species DETECTED (A) NOT DETECTED Final    Comment: CRITICAL RESULT CALLED TO, READ BACK BY AND VERIFIED WITH: T. EGAN, PHARMD AT 1720 ON 06/13/16 BY C. JESSUP, MLT.    Enterobacter cloacae complex NOT DETECTED NOT DETECTED Final   Escherichia coli DETECTED (A) NOT DETECTED Final    Comment: CRITICAL RESULT CALLED TO, READ BACK BY AND VERIFIED WITH: T. EGAN, PHARMD AT 1720 ON 06/13/16 BY C. JESSUP, MLT.    Klebsiella oxytoca NOT DETECTED NOT DETECTED Final   Klebsiella pneumoniae NOT DETECTED NOT DETECTED Final   Proteus species NOT DETECTED NOT DETECTED Final   Serratia marcescens NOT DETECTED NOT DETECTED Final   Carbapenem resistance NOT DETECTED NOT DETECTED Final   Haemophilus influenzae NOT DETECTED NOT DETECTED Final   Neisseria meningitidis NOT DETECTED NOT DETECTED Final   Pseudomonas aeruginosa NOT DETECTED NOT DETECTED Final   Candida albicans NOT DETECTED NOT DETECTED Final   Candida glabrata NOT DETECTED NOT DETECTED Final   Candida  krusei NOT DETECTED NOT DETECTED Final   Candida parapsilosis NOT DETECTED NOT DETECTED Final   Candida tropicalis NOT DETECTED NOT DETECTED Final  Urine culture     Status: Abnormal   Collection Time: 06/13/16  1:55 AM  Result Value Ref Range Status   Specimen Description URINE, RANDOM  Final   Special Requests NONE  Final   Culture >=100,000 COLONIES/mL ESCHERICHIA COLI (A)  Final   Report Status 06/15/2016 FINAL  Final   Organism ID, Bacteria ESCHERICHIA COLI (A)  Final      Susceptibility   Escherichia coli - MIC*    AMPICILLIN <=2 SENSITIVE Sensitive     CEFAZOLIN <=4 SENSITIVE Sensitive     CEFTRIAXONE <=1 SENSITIVE Sensitive     CIPROFLOXACIN <=0.25 SENSITIVE Sensitive     GENTAMICIN <=1 SENSITIVE Sensitive     IMIPENEM <=0.25 SENSITIVE Sensitive     NITROFURANTOIN <=16 SENSITIVE Sensitive     TRIMETH/SULFA <=20 SENSITIVE Sensitive     AMPICILLIN/SULBACTAM <=2 SENSITIVE Sensitive     PIP/TAZO <=4 SENSITIVE Sensitive     Extended ESBL NEGATIVE Sensitive     * >=100,000 COLONIES/mL ESCHERICHIA COLI  Blood Culture (routine x 2)     Status: Abnormal   Collection Time: 06/13/16  2:15 AM  Result Value Ref Range Status   Specimen Description BLOOD LEFT WRIST  Final   Special Requests IN PEDIATRIC BOTTLE 4ML  Final   Culture  Setup Time  Final    GRAM NEGATIVE RODS AEROBIC BOTTLE ONLY CRITICAL RESULT CALLED TO, READ BACK BY AND VERIFIED WITH: T. EGAN, PHARMD AT 1720 ON 06/13/16 BY C. JESSUP, MLT.    Culture (A)  Final    ESCHERICHIA COLI SUSCEPTIBILITIES PERFORMED ON PREVIOUS CULTURE WITHIN THE LAST 5 DAYS.    Report Status 06/15/2016 FINAL  Final  Respiratory Panel by PCR     Status: None   Collection Time: 06/13/16  9:19 AM  Result Value Ref Range Status   Adenovirus NOT DETECTED NOT DETECTED Final   Coronavirus 229E NOT DETECTED NOT DETECTED Final   Coronavirus HKU1 NOT DETECTED NOT DETECTED Final   Coronavirus NL63 NOT DETECTED NOT DETECTED Final   Coronavirus OC43  NOT DETECTED NOT DETECTED Final   Metapneumovirus NOT DETECTED NOT DETECTED Final   Rhinovirus / Enterovirus NOT DETECTED NOT DETECTED Final   Influenza A NOT DETECTED NOT DETECTED Final   Influenza B NOT DETECTED NOT DETECTED Final   Parainfluenza Virus 1 NOT DETECTED NOT DETECTED Final   Parainfluenza Virus 2 NOT DETECTED NOT DETECTED Final   Parainfluenza Virus 3 NOT DETECTED NOT DETECTED Final   Parainfluenza Virus 4 NOT DETECTED NOT DETECTED Final   Respiratory Syncytial Virus NOT DETECTED NOT DETECTED Final   Bordetella pertussis NOT DETECTED NOT DETECTED Final   Chlamydophila pneumoniae NOT DETECTED NOT DETECTED Final   Mycoplasma pneumoniae NOT DETECTED NOT DETECTED Final  Culture, blood (routine x 2)     Status: None (Preliminary result)   Collection Time: 06/17/16  6:07 PM  Result Value Ref Range Status   Specimen Description BLOOD LEFT ANTECUBITAL  Final   Special Requests BOTTLES DRAWN AEROBIC AND ANAEROBIC 5CC  Final   Culture NO GROWTH 2 DAYS  Final   Report Status PENDING  Incomplete  Culture, blood (routine x 2)     Status: None (Preliminary result)   Collection Time: 06/17/16  6:18 PM  Result Value Ref Range Status   Specimen Description BLOOD LEFT HAND  Final   Special Requests BOTTLES DRAWN AEROBIC AND ANAEROBIC 5CC  Final   Culture NO GROWTH 2 DAYS  Final   Report Status PENDING  Incomplete         Radiology Studies: No results found.      Scheduled Meds: . amLODipine  5 mg Oral Daily  . aspirin EC  81 mg Oral Daily  . cefTRIAXone (ROCEPHIN)  IV  2 g Intravenous Q12H  . feeding supplement (ENSURE ENLIVE)  237 mL Oral BID BM  . metoprolol tartrate  25 mg Oral BID  . omega-3 acid ethyl esters  1 g Oral Daily  . pantoprazole  40 mg Intravenous Q12H  . polyethylene glycol  17 g Oral BID  . senna-docusate  1 tablet Oral BID  . simvastatin  20 mg Oral q1800  . sodium chloride  2,000 mL Intravenous Once   Continuous Infusions: . sodium chloride        LOS: 7 days    Time spent:35 minutes.     Alba Cory, MD [facility] Pager [phone]  If 7PM-7AM, please contact night-coverage [email or web address] Password [email or web address] 06/20/2016, 12:16 PM

## 2016-06-20 NOTE — Progress Notes (Signed)
Patient concerned about when TEE scheduled. Call Endoscopy and told she was not on the list.  Paged cardiology and will have someone come and see patient.  Patient and husband upset that they stayed the weekend to have this test today. Pt resting with call bell within reach.  Will continue to monitor. Thomas HoffBurton, Jase Himmelberger McClintock, RN

## 2016-06-20 NOTE — Progress Notes (Signed)
    [facility] has been requested to perform a transesophageal echocardiogram on this patient for bacteremia and stroke. After careful review of history and examination, the risks and benefits of transesophageal echocardiogram have been explained including risks of esophageal damage, perforation (1:10,000 risk), bleeding, pharyngeal hematoma as well as other potential complications associated with conscious sedation including aspiration, arrhythmia, respiratory failure and death. Alternatives to treatment were discussed, questions were answered. Patient is willing to proceed. Tentatively scheduled for tomorrow at 12pm with Dr. Jens Somrenshaw.   Laurann Montanaayna N Crystina Borrayo, PA-C 06/20/2016 4:04 PM

## 2016-06-20 NOTE — Progress Notes (Signed)
INFECTIOUS DISEASE PROGRESS NOTE  ID: [name] is a 80 y.o. female with  Principal Problem:   Sepsis (HCC) Active Problems:   Acute renal failure superimposed on stage 3 chronic kidney disease (HCC)   Essential hypertension   CAP (community acquired pneumonia)   Nausea vomiting and diarrhea   Neutropenia (HCC)   Anxiety   UGIB (upper gastrointestinal bleed)   Hypokalemia   Leukopenia   AKI (acute kidney injury) (HCC)   Dehydration   Encounter for central line placement   Difficult intravenous access   E coli bacteremia   Iron deficiency anemia due to chronic blood loss   Embolic stroke involving right posterior cerebral artery (HCC)   Confusion  Subjective: No complaints.   Abtx:  Anti-infectives    Start     Dose/Rate Route Frequency Ordered Stop   06/17/16 1230  cefTRIAXone (ROCEPHIN) 2 g in dextrose 5 % 50 mL IVPB     2 g 100 mL/hr over 30 Minutes Intravenous Every 12 hours 06/17/16 1216     06/17/16 1215  ceFAZolin (ANCEF) IVPB 1 g/50 mL premix  Status:  Discontinued     1 g 100 mL/hr over 30 Minutes Intravenous Every 12 hours 06/17/16 1213 06/17/16 1216   06/17/16 1200  cefTRIAXone (ROCEPHIN) 2 g in dextrose 5 % 50 mL IVPB  Status:  Discontinued     2 g 100 mL/hr over 30 Minutes Intravenous Every 24 hours 06/17/16 1140 06/17/16 1213   06/17/16 1000  sulfamethoxazole-trimethoprim (BACTRIM DS,SEPTRA DS) 800-160 MG per tablet 1 tablet  Status:  Discontinued     1 tablet Oral Every 12 hours 06/17/16 0900 06/17/16 1140   06/16/16 1000  imipenem-cilastatin (PRIMAXIN) 500 mg in sodium chloride 0.9 % 100 mL IVPB  Status:  Discontinued     500 mg 200 mL/hr over 30 Minutes Intravenous Every 8 hours 06/16/16 0931 06/17/16 0859   06/15/16 0200  levofloxacin (LEVAQUIN) IVPB 500 mg  Status:  Discontinued     500 mg 100 mL/hr over 60 Minutes Intravenous Every 48 hours 06/13/16 0558 06/13/16 1343   06/14/16 0300  vancomycin (VANCOCIN) 500 mg in sodium chloride 0.9 % 100  mL IVPB  Status:  Discontinued     500 mg 100 mL/hr over 60 Minutes Intravenous Every 24 hours 06/13/16 1344 06/14/16 1007   06/13/16 1400  imipenem-cilastatin (PRIMAXIN) 500 mg in sodium chloride 0.9 % 100 mL IVPB  Status:  Discontinued     500 mg 200 mL/hr over 30 Minutes Intravenous Every 12 hours 06/13/16 1344 06/16/16 0931   06/13/16 0145  levofloxacin (LEVAQUIN) IVPB 750 mg     750 mg 100 mL/hr over 90 Minutes Intravenous  Once 06/13/16 0143 06/13/16 0334   06/13/16 0145  aztreonam (AZACTAM) 2 g in dextrose 5 % 50 mL IVPB     2 g 100 mL/hr over 30 Minutes Intravenous  Once 06/13/16 0143 06/13/16 0242   06/13/16 0145  vancomycin (VANCOCIN) IVPB 1000 mg/200 mL premix     1,000 mg 200 mL/hr over 60 Minutes Intravenous  Once 06/13/16 0143 06/13/16 0335      Medications:  Scheduled: . amLODipine  5 mg Oral Daily  . aspirin EC  81 mg Oral Daily  . cefTRIAXone (ROCEPHIN)  IV  2 g Intravenous Q12H  . feeding supplement (ENSURE ENLIVE)  237 mL Oral BID BM  . metoprolol tartrate  25 mg Oral BID  . omega-3 acid ethyl esters  1 g Oral Daily  .  pantoprazole  40 mg Intravenous Q12H  . polyethylene glycol  17 g Oral BID  . senna-docusate  1 tablet Oral BID  . simvastatin  20 mg Oral q1800  . sodium chloride  2,000 mL Intravenous Once    Objective: Vital signs in last 24 hours: Temp:  [98.1 F (36.7 C)-98.2 F (36.8 C)] 98.1 F (36.7 C) (09/18 0455) Pulse Rate:  [72-78] 72 (09/18 0455) Resp:  [14-17] 17 (09/18 0455) BP: (150-170)/(63-93) 161/76 (09/18 0652) SpO2:  [96 %-100 %] 99 % (09/18 0455) Weight:  [48.2 kg (106 lb 4.8 oz)] 48.2 kg (106 lb 4.8 oz) (09/18 0455)   General appearance: alert, cooperative and no distress Resp: clear to auscultation bilaterally Cardio: regular rate and rhythm GI: normal findings: bowel sounds normal and soft, non-tender  Lab Results  Recent Labs  06/19/16 0316 06/20/16 0246  WBC 8.3 9.4  HGB 12.0 12.9  HCT 37.3 40.2  NA 140 139  K  4.1 4.0  CL 105 102  CO2 28 28  BUN 22* 19  CREATININE 0.94 0.96   Liver Panel No results for input(s): PROT, ALBUMIN, AST, ALT, ALKPHOS, BILITOT, BILIDIR, IBILI in the last 72 hours. Sedimentation Rate No results for input(s): ESRSEDRATE in the last 72 hours. C-Reactive Protein No results for input(s): CRP in the last 72 hours.  Microbiology: Recent Results (from the past 240 hour(s))  Blood Culture (routine x 2)     Status: Abnormal   Collection Time: 06/13/16  1:28 AM  Result Value Ref Range Status   Specimen Description BLOOD LEFT ARM  Final   Special Requests BOTTLES DRAWN AEROBIC AND ANAEROBIC 5ML  Final   Culture  Setup Time   Final    GRAM NEGATIVE RODS IN BOTH AEROBIC AND ANAEROBIC BOTTLES CRITICAL RESULT CALLED TO, READ BACK BY AND VERIFIED WITH: T.EGAN, PHARMD AT1720 ON 06/13/16 BY C. JESSUP, MLT.    Culture (A)  Final    ESCHERICHIA COLI SUSCEPTIBILITIES PERFORMED ON PREVIOUS CULTURE WITHIN THE LAST 5 DAYS.    Report Status 06/16/2016 FINAL  Final   Organism ID, Bacteria ESCHERICHIA COLI  Final      Susceptibility   Escherichia coli - MIC*    AMPICILLIN <=2 SENSITIVE Sensitive     CEFAZOLIN <=4 SENSITIVE Sensitive     CEFEPIME <=1 SENSITIVE Sensitive     CEFTAZIDIME <=1 SENSITIVE Sensitive     CEFTRIAXONE <=1 SENSITIVE Sensitive     CIPROFLOXACIN <=0.25 SENSITIVE Sensitive     GENTAMICIN <=1 SENSITIVE Sensitive     IMIPENEM <=0.25 SENSITIVE Sensitive     TRIMETH/SULFA <=20 SENSITIVE Sensitive     AMPICILLIN/SULBACTAM <=2 SENSITIVE Sensitive     PIP/TAZO <=4 SENSITIVE Sensitive     Extended ESBL NEGATIVE Sensitive     * ESCHERICHIA COLI  Blood Culture ID Panel (Reflexed)     Status: Abnormal   Collection Time: 06/13/16  1:28 AM  Result Value Ref Range Status   Enterococcus species NOT DETECTED NOT DETECTED Final   Listeria monocytogenes NOT DETECTED NOT DETECTED Final   Staphylococcus species NOT DETECTED NOT DETECTED Final   Staphylococcus aureus NOT  DETECTED NOT DETECTED Final   Streptococcus species NOT DETECTED NOT DETECTED Final   Streptococcus agalactiae NOT DETECTED NOT DETECTED Final   Streptococcus pneumoniae NOT DETECTED NOT DETECTED Final   Streptococcus pyogenes NOT DETECTED NOT DETECTED Final   Acinetobacter baumannii NOT DETECTED NOT DETECTED Final   Enterobacteriaceae species DETECTED (A) NOT DETECTED Final    Comment:  CRITICAL RESULT CALLED TO, READ BACK BY AND VERIFIED WITH: T. EGAN, PHARMD AT 1720 ON 06/13/16 BY C. JESSUP, MLT.    Enterobacter cloacae complex NOT DETECTED NOT DETECTED Final   Escherichia coli DETECTED (A) NOT DETECTED Final    Comment: CRITICAL RESULT CALLED TO, READ BACK BY AND VERIFIED WITH: T. EGAN, PHARMD AT 1720 ON 06/13/16 BY C. JESSUP, MLT.    Klebsiella oxytoca NOT DETECTED NOT DETECTED Final   Klebsiella pneumoniae NOT DETECTED NOT DETECTED Final   Proteus species NOT DETECTED NOT DETECTED Final   Serratia marcescens NOT DETECTED NOT DETECTED Final   Carbapenem resistance NOT DETECTED NOT DETECTED Final   Haemophilus influenzae NOT DETECTED NOT DETECTED Final   Neisseria meningitidis NOT DETECTED NOT DETECTED Final   Pseudomonas aeruginosa NOT DETECTED NOT DETECTED Final   Candida albicans NOT DETECTED NOT DETECTED Final   Candida glabrata NOT DETECTED NOT DETECTED Final   Candida krusei NOT DETECTED NOT DETECTED Final   Candida parapsilosis NOT DETECTED NOT DETECTED Final   Candida tropicalis NOT DETECTED NOT DETECTED Final  Urine culture     Status: Abnormal   Collection Time: 06/13/16  1:55 AM  Result Value Ref Range Status   Specimen Description URINE, RANDOM  Final   Special Requests NONE  Final   Culture >=100,000 COLONIES/mL ESCHERICHIA COLI (A)  Final   Report Status 06/15/2016 FINAL  Final   Organism ID, Bacteria ESCHERICHIA COLI (A)  Final      Susceptibility   Escherichia coli - MIC*    AMPICILLIN <=2 SENSITIVE Sensitive     CEFAZOLIN <=4 SENSITIVE Sensitive      CEFTRIAXONE <=1 SENSITIVE Sensitive     CIPROFLOXACIN <=0.25 SENSITIVE Sensitive     GENTAMICIN <=1 SENSITIVE Sensitive     IMIPENEM <=0.25 SENSITIVE Sensitive     NITROFURANTOIN <=16 SENSITIVE Sensitive     TRIMETH/SULFA <=20 SENSITIVE Sensitive     AMPICILLIN/SULBACTAM <=2 SENSITIVE Sensitive     PIP/TAZO <=4 SENSITIVE Sensitive     Extended ESBL NEGATIVE Sensitive     * >=100,000 COLONIES/mL ESCHERICHIA COLI  Blood Culture (routine x 2)     Status: Abnormal   Collection Time: 06/13/16  2:15 AM  Result Value Ref Range Status   Specimen Description BLOOD LEFT WRIST  Final   Special Requests IN PEDIATRIC BOTTLE  Final   Culture  Setup Time   Final    GRAM NEGATIVE RODS AEROBIC BOTTLE ONLY CRITICAL RESULT CALLED TO, READ BACK BY AND VERIFIED WITH: T. EGAN, PHARMD AT 1720 ON 06/13/16 BY C. JESSUP, MLT.    Culture (A)  Final    ESCHERICHIA COLI SUSCEPTIBILITIES PERFORMED ON PREVIOUS CULTURE WITHIN THE LAST 5 DAYS.    Report Status 06/15/2016 FINAL  Final  Respiratory Panel by PCR     Status: None   Collection Time: 06/13/16  9:19 AM  Result Value Ref Range Status   Adenovirus NOT DETECTED NOT DETECTED Final   Coronavirus 229E NOT DETECTED NOT DETECTED Final   Coronavirus HKU1 NOT DETECTED NOT DETECTED Final   Coronavirus NL63 NOT DETECTED NOT DETECTED Final   Coronavirus OC43 NOT DETECTED NOT DETECTED Final   Metapneumovirus NOT DETECTED NOT DETECTED Final   Rhinovirus / Enterovirus NOT DETECTED NOT DETECTED Final   Influenza A NOT DETECTED NOT DETECTED Final   Influenza B NOT DETECTED NOT DETECTED Final   Parainfluenza Virus 1 NOT DETECTED NOT DETECTED Final   Parainfluenza Virus 2 NOT DETECTED NOT DETECTED Final  Parainfluenza Virus 3 NOT DETECTED NOT DETECTED Final   Parainfluenza Virus 4 NOT DETECTED NOT DETECTED Final   Respiratory Syncytial Virus NOT DETECTED NOT DETECTED Final   Bordetella pertussis NOT DETECTED NOT DETECTED Final   Chlamydophila pneumoniae NOT  DETECTED NOT DETECTED Final   Mycoplasma pneumoniae NOT DETECTED NOT DETECTED Final  Culture, blood (routine x 2)     Status: None (Preliminary result)   Collection Time: 06/17/16  6:07 PM  Result Value Ref Range Status   Specimen Description BLOOD LEFT ANTECUBITAL  Final   Special Requests BOTTLES DRAWN AEROBIC AND ANAEROBIC 5CC  Final   Culture NO GROWTH 2 DAYS  Final   Report Status PENDING  Incomplete  Culture, blood (routine x 2)     Status: None (Preliminary result)   Collection Time: 06/17/16  6:18 PM  Result Value Ref Range Status   Specimen Description BLOOD LEFT HAND  Final   Special Requests BOTTLES DRAWN AEROBIC AND ANAEROBIC 5CC  Final   Culture NO GROWTH 2 DAYS  Final   Report Status PENDING  Incomplete    Studies/Results: No results found.   Assessment/Plan: Bacteremia UTI + Troponins  NSAID association gastritis, duodenal and gastric ulcers CVA (thrombosis vs embolus)  Would: Repeat BCx ngtd Await TEE to determine duration of anbx  Total days of antibiotics: 7 ceftriaxone         Johny Sax Infectious Diseases (pager) [phone] [email or web address] 06/20/2016, 9:59 AM  LOS: 7 days

## 2016-06-20 NOTE — Progress Notes (Signed)
Pt's BP elevated this AM, SBP in 170s. Pt informed that she has prn meds that can lower her BP. Pt states that she is nervous and anxious her TEE procedure today. Pt does not want prn meds right now she said anesthesia caused her BP to go down really last time. Will continue to monitor and recheck in one hour.   [name]

## 2016-06-20 NOTE — Progress Notes (Signed)
STROKE TEAM PROGRESS NOTE   HISTORY OF PRESENT ILLNESS (per record) [name] is an 80 y.o. female brought to hospital with UGIB and GNR bacteremia. Patient has had encephalopathy since admission. MRI was obtained for Delirium while in hospital and revealed a right PCA infarct. MRA showed Occlusion of the distal right PCA. Note is made of a fetal type right PCA. Along with  Atheromatous irregularity with severe tandem nonocclusive the stenoses within the left PCA, Severe nonocclusive left A2 stenosis. Neurology was asked to see due to MRI findings.   Currently she is Afebrile with WBC 18.1--down from 21.2.   Currently patient has not confused and feels back to baseline. Daughter at bedside also concurs that she is at baseline. In discussing her recent stroke, patient has no idea when this might of happened as she does not feel that she had any symptoms.   Date last known well: Unable to determine Time last known well: Unable to determine tPA Given: No: no LSN   SUBJECTIVE (INTERVAL HISTORY) Her family is not  at the bedside.  Overall she feels her condition is completely resolved.  TEE unfortunately will not be done for a few days due to staffing issues.   OBJECTIVE Temp:  [98.1 F (36.7 C)-98.7 F (37.1 C)] 98.7 F (37.1 C) (09/18 1257) Pulse Rate:  [72-78] 74 (09/18 1257) Cardiac Rhythm: Normal sinus rhythm (09/18 0700) Resp:  [14-18] 18 (09/18 1257) BP: (150-170)/(63-93) 157/77 (09/18 1257) SpO2:  [96 %-100 %] 97 % (09/18 1257) Weight:  [106 lb 4.8 oz (48.2 kg)] 106 lb 4.8 oz (48.2 kg) (09/18 0455)  CBC:  Recent Labs Lab 06/16/16 0437  06/19/16 0316 06/20/16 0246  WBC 18.1*  < > 8.3 9.4  NEUTROABS 16.4*  --   --   --   HGB 12.5  < > 12.0 12.9  HCT 37.5  < > 37.3 40.2  MCV 92.6  < > 94.7 94.8  PLT 265  < > 234 245  < > = values in this interval not displayed.  Basic Metabolic Panel:  Recent Labs Lab 06/14/16 0521 06/15/16 0747  06/19/16 0316 06/20/16 0246   NA 142 141  < > 140 139  K 4.5 3.4*  < > 4.1 4.0  CL 117* 117*  < > 105 102  CO2 16* 17*  < > 28 28  GLUCOSE 100* 154*  < > 102* 101*  BUN 19 28*  < > 22* 19  CREATININE 1.22* 1.13*  < > 0.94 0.96  CALCIUM 8.4* 8.8*  < > 9.6 10.0  MG 1.9 1.7  --   --   --   PHOS 3.5  --   --   --   --   < > = values in this interval not displayed.  Lipid Panel:     Component Value Date/Time   CHOL 200 06/17/2016 0340   TRIG 203 (H) 06/17/2016 0340   HDL 39 (L) 06/17/2016 0340   CHOLHDL 5.1 06/17/2016 0340   VLDL 41 (H) 06/17/2016 0340   LDLCALC 120 (H) 06/17/2016 0340   HgbA1c:  Lab Results  Component Value Date   HGBA1C 5.1 06/17/2016   Urine Drug Screen: No results found for: LABOPIA, COCAINSCRNUR, LABBENZ, AMPHETMU, THCU, LABBARB    IMAGING I have personally reviewed the radiological images below and agree with the radiology interpretations.  Ct Head Wo Contrast 06/15/2016 1. No definite acute intracranial abnormalities.  2. There is a new densely calcified plaque in  the right vertebral artery (as above) compared to prior study from 12/08/2009. Although not assessable on today's noncontrast CT examination, the appearance could suggest a hemodynamically significant stenosis. If there are signs or symptoms of vertebrobasilar insufficiency, further evaluation with noncontrast brain MRI/MRA could be considered.  3. Mild cerebral atrophy with mild chronic microvascular ischemic changes in the cerebral white matter, as above.   [name] Head Wo Contrast  06/16/2016  MRI HEAD  1. Acute/early subacute ischemic nonhemorrhagic right PCA territory infarcts.  2. Moderate chronic microvascular ischemic disease.   MRA HEAD  1. Occlusion of the distal right PCA. Note is made of a fetal type right PCA.  2. Atheromatous irregularity with severe tandem nonocclusive the stenoses within the left PCA.  3. Severe nonocclusive left A2 stenosis.  4. Additional more mild atheromatous disease involving the  anterior posterior circulation as above.   Transthoracic echocardiogram 06/15/2016 Study Conclusions - Left ventricle: The cavity size was normal. Wall thickness was   normal. Systolic function was normal. The estimated ejection   fraction was in the range of 60% to 65%. Wall motion was normal;   there were no regional wall motion abnormalities. Doppler   parameters are consistent with abnormal left ventricular   relaxation (grade 1 diastolic dysfunction). - Mitral valve: There was mild regurgitation. - Pulmonary arteries: Systolic pressure was mildly increased. PA   peak pressure: 35 mm Hg (S). - Pericardium, extracardiac: There was a left pleural effusion.  CUS No evidence of stenosis noted in bilateral carotid arteries. Vertebral arteries demonstrated antegrade flow  PHYSICAL EXAM HEENT-  Normocephalic, no lesions, without obvious abnormality.  Normal external eye and conjunctiva.  Normal TM's bilaterally.  Normal auditory canals and external ears. Normal external nose, mucus membranes and septum.  Normal pharynx. Cardiovascular- S1, S2 normal, pulses palpable throughout   Lungs- chest clear, no wheezing, rales, normal symmetric air entry Abdomen- normal findings: bowel sounds normal Extremities- no edema Lymph-no adenopathy palpable Musculoskeletal-no joint tenderness, deformity or swelling Skin-warm and dry, no hyperpigmentation, vitiligo, or suspicious lesions  Neurological Examination Mental Status: Alert, oriented, thought content appropriate.  Speech fluent without evidence of aphasia.  Able to follow 3 step commands without difficulty.diminished recall 1/3 only. Cranial Nerves: II: Discs flat bilaterally; Visual fields grossly normal, pupils equal, round, reactive to light and accommodation III,IV, VI: ptosis not present, extra-ocular motions intact bilaterally. Partial left superior quadrantanopsia V,VII: smile symmetric, facial light touch sensation normal  bilaterally VIII: hearing normal bilaterally IX,X: uvula rises symmetrically XI: bilateral shoulder shrug XII: midline tongue extension Motor: Right :  Upper extremity   5/5                                      Left:     Upper extremity   5/5             Lower extremity   5/5                                                  Lower extremity   5/5 Tone and bulk:normal tone throughout; no atrophy noted Sensory: Pinprick and light touch intact throughout, bilaterally Deep Tendon Reflexes: 2+ and symmetric throughout Plantars: Right: downgoing  Left: downgoing Cerebellar: normal finger-to-nose and normal heel-to-shin test Gait: normal gait and station   ASSESSMENT/PLAN  Ms. GINNA SCHUUR is a 80 y.o. female with history of hypertension, chronic kidney disease, thyroid disease, recent upper GI bleed and gram negative rods bacteremia presenting with altered mental status. She did not receive IV t-PA due to unknown time of onset and upper GI bleed.   Stroke:  Right PCA scattered infarcts, embolic secondary to an unknown source but endocarditis needs to be ruled out.  Resultant  No neuro deficit  MRI - Acute/early subacute scattered right PCA territory infarcts.   MRA - Occlusion of the distal right PCA. Left PCA high grade stenosis. Severe nonocclusive left A2 stenosis. Bilateral MCA atherosclerosis. No mycotic aneurysm seen.  Carotid Doppler - no significant extracranial stenosis.  2D Echo - EF 60-65%. No cardiac source of emboli identified.  LDL- 120  HgbA1c  5.1  VTE prophylaxis - SCDs Diet regular Room service appropriate? Yes; Fluid consistency: Thin  No antithrombotic prior to admission, now on aspirin 81 mg daily given no mycotic aneurysm seen.   Patient counseled to be compliant with her antithrombotic medications  Ongoing aggressive stroke risk factor management  Therapy recommendations: No PT or OT follow-up recommended  Disposition:  pending  Bacteremia  Blood culture showed Escherichia coli  On imipenem  ID on board, agree with TEE  Due to right PCA stroke, endocarditis needs to be ruled out which could potentially change antibiotics course  Recommend TEE on Monday, patient is in agreement  Upper GI bleeding  Hb 6.8 -> 13.1  GI okay with aspirin 81  No more bleeding  Hypertension  Blood pressures running mildly high.  Permissive hypertension (OK if < 220/120) but gradually normalize in 5-7 days  Long-term BP goal normotensive  Hyperlipidemia  Home meds:  Lovaza 1 gm daily.  LDL 120, goal < 70  Now on Zocor 20 mg daily.  Continue statin at discharge  Other Stroke Risk Factors  Advanced age  Family hx stroke (Mother)  Other Active Problems  Leukocytosis - 18.1 -> 11.7  Hypokalemia - 3.3   (supplemented)  Chronic kidney disease - creatinine 1.03  Hospital day # 7 I have personally examined this patient, reviewed notes, independently viewed imaging studies, participated in medical decision making and plan of care.ROS completed by me personally and pertinent positives fully documented  I have made any additions or clarifications directly to the above note.  Await TEE Delia Heady, MD Medical Director [facility] Pager: [phone] 06/20/2016 2:06 PM      To contact Stroke Continuity provider, please refer to [email or web address]. After hours, contact General Neurology

## 2016-06-21 ENCOUNTER — Inpatient Hospital Stay (HOSPITAL_COMMUNITY): Payer: BC Managed Care – PPO

## 2016-06-21 ENCOUNTER — Encounter (HOSPITAL_COMMUNITY): Payer: Self-pay | Admitting: *Deleted

## 2016-06-21 ENCOUNTER — Encounter (HOSPITAL_COMMUNITY)
Admission: EM | Disposition: A | Discharge status: Home Health | Payer: Self-pay | Source: Home / Self Care | Attending: Internal Medicine

## 2016-06-21 DIAGNOSIS — I340 Nonrheumatic mitral (valve) insufficiency: Secondary | ICD-10-CM

## 2016-06-21 DIAGNOSIS — Z880 Allergy status to penicillin: Secondary | ICD-10-CM

## 2016-06-21 DIAGNOSIS — I330 Acute and subacute infective endocarditis: Secondary | ICD-10-CM

## 2016-06-21 HISTORY — PX: TEE WITHOUT CARDIOVERSION: SHX5443

## 2016-06-21 SURGERY — ECHOCARDIOGRAM, TRANSESOPHAGEAL
Anesthesia: Moderate Sedation

## 2016-06-21 MED ORDER — SODIUM CHLORIDE 0.9 % IV SOLN: INTRAVENOUS | Status: DC

## 2016-06-21 MED ORDER — DEXTROSE 5 % IV SOLN: 2.0000 g | INTRAVENOUS | Status: DC

## 2016-06-21 MED ORDER — AMLODIPINE BESYLATE 5 MG PO TABS: 5.0000 mg | ORAL_TABLET | Freq: Every day | ORAL | 0 refills | Status: DC

## 2016-06-21 MED ORDER — LEVOFLOXACIN 500 MG PO TABS
250.0000 mg | ORAL_TABLET | Freq: Every day | ORAL | Status: DC
Start: 2016-06-21 — End: 2016-06-21
  Administered 2016-06-21: 250 mg via ORAL
  Filled 2016-06-21: qty 1

## 2016-06-21 MED ORDER — PANTOPRAZOLE SODIUM 40 MG PO TBEC: 40.0000 mg | DELAYED_RELEASE_TABLET | Freq: Two times a day (BID) | ORAL | 0 refills | Status: DC

## 2016-06-21 MED ORDER — MIDAZOLAM HCL 10 MG/2ML IJ SOLN
INTRAMUSCULAR | Status: DC | PRN
  Administered 2016-06-21 (×5): 1 mg via INTRAVENOUS

## 2016-06-21 MED ORDER — BUTAMBEN-TETRACAINE-BENZOCAINE 2-2-14 % EX AERO
INHALATION_SPRAY | CUTANEOUS | Status: DC | PRN
  Administered 2016-06-21: 2 via TOPICAL

## 2016-06-21 MED ORDER — ENSURE ENLIVE PO LIQD: 237.0000 mL | Freq: Two times a day (BID) | ORAL | 12 refills | Status: DC

## 2016-06-21 MED ORDER — SACCHAROMYCES BOULARDII 250 MG PO CAPS: 250.0000 mg | ORAL_CAPSULE | Freq: Two times a day (BID) | ORAL | 0 refills | Status: DC

## 2016-06-21 MED ORDER — MIDAZOLAM HCL 5 MG/ML IJ SOLN
INTRAMUSCULAR | Status: AC
  Filled 2016-06-21: qty 2

## 2016-06-21 MED ORDER — FENTANYL CITRATE (PF) 100 MCG/2ML IJ SOLN
INTRAMUSCULAR | Status: AC
Start: 2016-06-21 — End: 2016-06-21
  Filled 2016-06-21: qty 2

## 2016-06-21 MED ORDER — SODIUM CHLORIDE 0.9% FLUSH: 10.0000 mL | INTRAVENOUS | Status: DC | PRN

## 2016-06-21 MED ORDER — FENTANYL CITRATE (PF) 100 MCG/2ML IJ SOLN
INTRAMUSCULAR | Status: DC | PRN
  Administered 2016-06-21 (×4): 12.5 ug via INTRAVENOUS

## 2016-06-21 MED ORDER — SIMVASTATIN 20 MG PO TABS: 20.0000 mg | ORAL_TABLET | Freq: Every day | ORAL | 0 refills | Status: DC

## 2016-06-21 MED ORDER — DIPHENHYDRAMINE HCL 50 MG/ML IJ SOLN
INTRAMUSCULAR | Status: AC
  Filled 2016-06-21: qty 1

## 2016-06-21 MED ORDER — DEXTROSE 5 % IV SOLN: 2.0000 g | INTRAVENOUS | 0 refills | Status: DC

## 2016-06-21 MED ORDER — LEVOFLOXACIN 250 MG PO TABS: 250.0000 mg | ORAL_TABLET | Freq: Every day | ORAL | 0 refills | Status: DC

## 2016-06-21 NOTE — Progress Notes (Addendum)
Pt. is in a stable condition. Discharge education was provided by this RN. Patients daughter at bedside during education. They verbalized understanding. Paper prescriptions given to patient. Pt. belongings at bedside. Taken off telemetry, CCMD notified. Pt. taken to entrance/exit via wheelchair to meet her husband.

## 2016-06-21 NOTE — Progress Notes (Signed)
Peripherally Inserted Central Catheter/Midline Placement  The IV Nurse has discussed with the patient and/or persons authorized to consent for the patient, the purpose of this procedure and the potential benefits and risks involved with this procedure.  The benefits include less needle sticks, lab draws from the catheter, and the patient may be discharged home with the catheter. Risks include, but not limited to, infection, bleeding, blood clot (thrombus formation), and puncture of an artery; nerve damage and irregular heartbeat and possibility to perform a PICC exchange if needed/ordered by physician.  Alternatives to this procedure were also discussed.  Bard Power PICC patient education guide, fact sheet on infection prevention and patient information card has been provided to patient /or left at bedside.    PICC/Midline Placement Documentation        Maximino GreenlandLumban, Elic Vencill Albarece 06/21/2016, 2:50 PM

## 2016-06-21 NOTE — Progress Notes (Signed)
TEE result noted.   Case discussed with pharm, Dr Tyrell Antonio and pt/family.   Diagnosis: Endocarditis with CVA  Culture Result: E coli (pan-sens)  Allergies  Allergen Reactions  . Amoxicillin Hives and Itching    Has patient had a PCN reaction causing immediate rash, facial/tongue/throat swelling, SOB or lightheadedness with hypotension: Yes Has patient had a PCN reaction causing severe rash involving mucus membranes or skin necrosis: Yes Has patient had a PCN reaction that required hospitalization No Has patient had a PCN reaction occurring within the last 10 years: Yes If all of the above answers are "NO", then may proceed with Cephalosporin use.     Discharge antibiotics: levaquin 249m qday Ceftriaxone 2g IVPB qday  Duration: 34 more days End Date: July 25, 2016  [facility]  Labs weekly while on IV antibiotics: _x_ CBC with differential __ BMP _x_ CMP __ CRP __ ESR __ Vancomycin trough  Fax weekly labs to [phone]  Clinic Follow Up Appt: Remon Quinto 3 weeks

## 2016-06-21 NOTE — H&P (View-Only) (Signed)
INFECTIOUS DISEASE PROGRESS NOTE  ID: [name] is a 80 y.o. female with  Principal Problem:   Sepsis (HCC) Active Problems:   Acute renal failure superimposed on stage 3 chronic kidney disease (HCC)   Essential hypertension   CAP (community acquired pneumonia)   Nausea vomiting and diarrhea   Neutropenia (HCC)   Anxiety   UGIB (upper gastrointestinal bleed)   Hypokalemia   Leukopenia   AKI (acute kidney injury) (HCC)   Dehydration   Encounter for central line placement   Difficult intravenous access   E coli bacteremia   Iron deficiency anemia due to chronic blood loss   Embolic stroke involving right posterior cerebral artery (HCC)   Confusion  Subjective: Without complaints.   Abtx:  Anti-infectives    Start     Dose/Rate Route Frequency Ordered Stop   06/17/16 1230  cefTRIAXone (ROCEPHIN) 2 g in dextrose 5 % 50 mL IVPB     2 g 100 mL/hr over 30 Minutes Intravenous Every 12 hours 06/17/16 1216     06/17/16 1215  ceFAZolin (ANCEF) IVPB 1 g/50 mL premix  Status:  Discontinued     1 g 100 mL/hr over 30 Minutes Intravenous Every 12 hours 06/17/16 1213 06/17/16 1216   06/17/16 1200  cefTRIAXone (ROCEPHIN) 2 g in dextrose 5 % 50 mL IVPB  Status:  Discontinued     2 g 100 mL/hr over 30 Minutes Intravenous Every 24 hours 06/17/16 1140 06/17/16 1213   06/17/16 1000  sulfamethoxazole-trimethoprim (BACTRIM DS,SEPTRA DS) 800-160 MG per tablet 1 tablet  Status:  Discontinued     1 tablet Oral Every 12 hours 06/17/16 0900 06/17/16 1140   06/16/16 1000  imipenem-cilastatin (PRIMAXIN) 500 mg in sodium chloride 0.9 % 100 mL IVPB  Status:  Discontinued     500 mg 200 mL/hr over 30 Minutes Intravenous Every 8 hours 06/16/16 0931 06/17/16 0859   06/15/16 0200  levofloxacin (LEVAQUIN) IVPB 500 mg  Status:  Discontinued     500 mg 100 mL/hr over 60 Minutes Intravenous Every 48 hours 06/13/16 0558 06/13/16 1343   06/14/16 0300  vancomycin (VANCOCIN) 500 mg in sodium chloride 0.9 %  100 mL IVPB  Status:  Discontinued     500 mg 100 mL/hr over 60 Minutes Intravenous Every 24 hours 06/13/16 1344 06/14/16 1007   06/13/16 1400  imipenem-cilastatin (PRIMAXIN) 500 mg in sodium chloride 0.9 % 100 mL IVPB  Status:  Discontinued     500 mg 200 mL/hr over 30 Minutes Intravenous Every 12 hours 06/13/16 1344 06/16/16 0931   06/13/16 0145  levofloxacin (LEVAQUIN) IVPB 750 mg     750 mg 100 mL/hr over 90 Minutes Intravenous  Once 06/13/16 0143 06/13/16 0334   06/13/16 0145  aztreonam (AZACTAM) 2 g in dextrose 5 % 50 mL IVPB     2 g 100 mL/hr over 30 Minutes Intravenous  Once 06/13/16 0143 06/13/16 0242   06/13/16 0145  vancomycin (VANCOCIN) IVPB 1000 mg/200 mL premix     1,000 mg 200 mL/hr over 60 Minutes Intravenous  Once 06/13/16 0143 06/13/16 0335      Medications:  Scheduled: . amLODipine  5 mg Oral Daily  . aspirin EC  81 mg Oral Daily  . cefTRIAXone (ROCEPHIN)  IV  2 g Intravenous Q12H  . feeding supplement (ENSURE ENLIVE)  237 mL Oral BID BM  . metoprolol tartrate  25 mg Oral BID  . omega-3 acid ethyl esters  1 g Oral Daily  .  pantoprazole  40 mg Oral BID  . polyethylene glycol  17 g Oral BID  . senna-docusate  1 tablet Oral BID  . simvastatin  20 mg Oral q1800  . sodium chloride  2,000 mL Intravenous Once    Objective: Vital signs in last 24 hours: Temp:  [98.5 F (36.9 C)-98.7 F (37.1 C)] 98.6 F (37 C) (09/19 1013) Pulse Rate:  [70-74] 70 (09/19 1013) Resp:  [12-18] 12 (09/19 1013) BP: (142-175)/(55-77) 150/63 (09/19 1013) SpO2:  [97 %-98 %] 98 % (09/19 1013) Weight:  [48.7 kg (107 lb 4.8 oz)] 48.7 kg (107 lb 4.8 oz) (09/19 0500)   General appearance: alert, cooperative and no distress Resp: clear to auscultation bilaterally Cardio: regular rate and rhythm GI: normal findings: bowel sounds normal and soft, non-tender  Lab Results  Recent Labs  06/19/16 0316 06/20/16 0246  WBC 8.3 9.4  HGB 12.0 12.9  HCT 37.3 40.2  NA 140 139  K 4.1 4.0    CL 105 102  CO2 28 28  BUN 22* 19  CREATININE 0.94 0.96   Liver Panel No results for input(s): PROT, ALBUMIN, AST, ALT, ALKPHOS, BILITOT, BILIDIR, IBILI in the last 72 hours. Sedimentation Rate No results for input(s): ESRSEDRATE in the last 72 hours. C-Reactive Protein No results for input(s): CRP in the last 72 hours.  Microbiology: Recent Results (from the past 240 hour(s))  Blood Culture (routine x 2)     Status: Abnormal   Collection Time: 06/13/16  1:28 AM  Result Value Ref Range Status   Specimen Description BLOOD LEFT ARM  Final   Special Requests BOTTLES DRAWN AEROBIC AND ANAEROBIC 5ML  Final   Culture  Setup Time   Final    GRAM NEGATIVE RODS IN BOTH AEROBIC AND ANAEROBIC BOTTLES CRITICAL RESULT CALLED TO, READ BACK BY AND VERIFIED WITH: T.EGAN, PHARMD AT1720 ON 06/13/16 BY C. JESSUP, MLT.    Culture (A)  Final    ESCHERICHIA COLI SUSCEPTIBILITIES PERFORMED ON PREVIOUS CULTURE WITHIN THE LAST 5 DAYS.    Report Status 06/16/2016 FINAL  Final   Organism ID, Bacteria ESCHERICHIA COLI  Final      Susceptibility   Escherichia coli - MIC*    AMPICILLIN <=2 SENSITIVE Sensitive     CEFAZOLIN <=4 SENSITIVE Sensitive     CEFEPIME <=1 SENSITIVE Sensitive     CEFTAZIDIME <=1 SENSITIVE Sensitive     CEFTRIAXONE <=1 SENSITIVE Sensitive     CIPROFLOXACIN <=0.25 SENSITIVE Sensitive     GENTAMICIN <=1 SENSITIVE Sensitive     IMIPENEM <=0.25 SENSITIVE Sensitive     TRIMETH/SULFA <=20 SENSITIVE Sensitive     AMPICILLIN/SULBACTAM <=2 SENSITIVE Sensitive     PIP/TAZO <=4 SENSITIVE Sensitive     Extended ESBL NEGATIVE Sensitive     * ESCHERICHIA COLI  Blood Culture ID Panel (Reflexed)     Status: Abnormal   Collection Time: 06/13/16  1:28 AM  Result Value Ref Range Status   Enterococcus species NOT DETECTED NOT DETECTED Final   Listeria monocytogenes NOT DETECTED NOT DETECTED Final   Staphylococcus species NOT DETECTED NOT DETECTED Final   Staphylococcus aureus NOT DETECTED  NOT DETECTED Final   Streptococcus species NOT DETECTED NOT DETECTED Final   Streptococcus agalactiae NOT DETECTED NOT DETECTED Final   Streptococcus pneumoniae NOT DETECTED NOT DETECTED Final   Streptococcus pyogenes NOT DETECTED NOT DETECTED Final   Acinetobacter baumannii NOT DETECTED NOT DETECTED Final   Enterobacteriaceae species DETECTED (A) NOT DETECTED Final  Comment: CRITICAL RESULT CALLED TO, READ BACK BY AND VERIFIED WITH: T. EGAN, PHARMD AT 1720 ON 06/13/16 BY C. JESSUP, MLT.    Enterobacter cloacae complex NOT DETECTED NOT DETECTED Final   Escherichia coli DETECTED (A) NOT DETECTED Final    Comment: CRITICAL RESULT CALLED TO, READ BACK BY AND VERIFIED WITH: T. EGAN, PHARMD AT 1720 ON 06/13/16 BY C. JESSUP, MLT.    Klebsiella oxytoca NOT DETECTED NOT DETECTED Final   Klebsiella pneumoniae NOT DETECTED NOT DETECTED Final   Proteus species NOT DETECTED NOT DETECTED Final   Serratia marcescens NOT DETECTED NOT DETECTED Final   Carbapenem resistance NOT DETECTED NOT DETECTED Final   Haemophilus influenzae NOT DETECTED NOT DETECTED Final   Neisseria meningitidis NOT DETECTED NOT DETECTED Final   Pseudomonas aeruginosa NOT DETECTED NOT DETECTED Final   Candida albicans NOT DETECTED NOT DETECTED Final   Candida glabrata NOT DETECTED NOT DETECTED Final   Candida krusei NOT DETECTED NOT DETECTED Final   Candida parapsilosis NOT DETECTED NOT DETECTED Final   Candida tropicalis NOT DETECTED NOT DETECTED Final  Urine culture     Status: Abnormal   Collection Time: 06/13/16  1:55 AM  Result Value Ref Range Status   Specimen Description URINE, RANDOM  Final   Special Requests NONE  Final   Culture >=100,000 COLONIES/mL ESCHERICHIA COLI (A)  Final   Report Status 06/15/2016 FINAL  Final   Organism ID, Bacteria ESCHERICHIA COLI (A)  Final      Susceptibility   Escherichia coli - MIC*    AMPICILLIN <=2 SENSITIVE Sensitive     CEFAZOLIN <=4 SENSITIVE Sensitive     CEFTRIAXONE <=1  SENSITIVE Sensitive     CIPROFLOXACIN <=0.25 SENSITIVE Sensitive     GENTAMICIN <=1 SENSITIVE Sensitive     IMIPENEM <=0.25 SENSITIVE Sensitive     NITROFURANTOIN <=16 SENSITIVE Sensitive     TRIMETH/SULFA <=20 SENSITIVE Sensitive     AMPICILLIN/SULBACTAM <=2 SENSITIVE Sensitive     PIP/TAZO <=4 SENSITIVE Sensitive     Extended ESBL NEGATIVE Sensitive     * >=100,000 COLONIES/mL ESCHERICHIA COLI  Blood Culture (routine x 2)     Status: Abnormal   Collection Time: 06/13/16  2:15 AM  Result Value Ref Range Status   Specimen Description BLOOD LEFT WRIST  Final   Special Requests IN PEDIATRIC BOTTLE  Final   Culture  Setup Time   Final    GRAM NEGATIVE RODS AEROBIC BOTTLE ONLY CRITICAL RESULT CALLED TO, READ BACK BY AND VERIFIED WITH: T. EGAN, PHARMD AT 1720 ON 06/13/16 BY C. JESSUP, MLT.    Culture (A)  Final    ESCHERICHIA COLI SUSCEPTIBILITIES PERFORMED ON PREVIOUS CULTURE WITHIN THE LAST 5 DAYS.    Report Status 06/15/2016 FINAL  Final  Respiratory Panel by PCR     Status: None   Collection Time: 06/13/16  9:19 AM  Result Value Ref Range Status   Adenovirus NOT DETECTED NOT DETECTED Final   Coronavirus 229E NOT DETECTED NOT DETECTED Final   Coronavirus HKU1 NOT DETECTED NOT DETECTED Final   Coronavirus NL63 NOT DETECTED NOT DETECTED Final   Coronavirus OC43 NOT DETECTED NOT DETECTED Final   Metapneumovirus NOT DETECTED NOT DETECTED Final   Rhinovirus / Enterovirus NOT DETECTED NOT DETECTED Final   Influenza A NOT DETECTED NOT DETECTED Final   Influenza B NOT DETECTED NOT DETECTED Final   Parainfluenza Virus 1 NOT DETECTED NOT DETECTED Final   Parainfluenza Virus 2 NOT DETECTED NOT DETECTED Final  Parainfluenza Virus 3 NOT DETECTED NOT DETECTED Final   Parainfluenza Virus 4 NOT DETECTED NOT DETECTED Final   Respiratory Syncytial Virus NOT DETECTED NOT DETECTED Final   Bordetella pertussis NOT DETECTED NOT DETECTED Final   Chlamydophila pneumoniae NOT DETECTED NOT  DETECTED Final   Mycoplasma pneumoniae NOT DETECTED NOT DETECTED Final  Culture, blood (routine x 2)     Status: None (Preliminary result)   Collection Time: 06/17/16  6:07 PM  Result Value Ref Range Status   Specimen Description BLOOD LEFT ANTECUBITAL  Final   Special Requests BOTTLES DRAWN AEROBIC AND ANAEROBIC 5CC  Final   Culture NO GROWTH 4 DAYS  Final   Report Status PENDING  Incomplete  Culture, blood (routine x 2)     Status: None (Preliminary result)   Collection Time: 06/17/16  6:18 PM  Result Value Ref Range Status   Specimen Description BLOOD LEFT HAND  Final   Special Requests BOTTLES DRAWN AEROBIC AND ANAEROBIC 5CC  Final   Culture NO GROWTH 4 DAYS  Final   Report Status PENDING  Incomplete    Studies/Results: No results found.   Assessment/Plan: Bacteremia UTI + Troponins  NSAID association gastritis, duodenal and gastric ulcers CVA (thrombosis vs embolus)  Repeat BCx ngtd Await TEE (today) to determine duration of anbx  Total days of antibiotics: 8 ceftriaxone         Johny Sax Infectious Diseases (pager) [phone] [email or web address] 06/21/2016, 10:44 AM  LOS: 8 days

## 2016-06-21 NOTE — Progress Notes (Signed)
[facility]  [facility]  [facility] and [facility] services for home IV Rocephin 2 Grams Q 24 hours x 6 weeks at home.  Pt performed full mock administration of IV Rocephin and did very well. Pt's daughter and husband both observed and will support as needed at home.  [facility]first home dose tomorrow between 10-12 PM.  Pt aware.   If patient discharges after hours, please call [phone].   Sedalia Mutaamela S Chandler 06/21/2016, 5:04 PM

## 2016-06-21 NOTE — Care Management Note (Signed)
Case Management Note Donn PieriniKristi Kalik Hoare RN, BSN Unit 2W-Case Manager [phone]  Patient Details  Name: [name] V Nair MRN: [id number] Date of Birth: Sep 11, 1936  Subjective/Objective:   Pt admitted with sepsis, bacteremia                 Action/Plan: PTA pt lived at home with spouse- plan to return home- per TEE today pt will need 6 wks of IV abx- PICC line has been placed this afternoon- spoke with pt and daughter at bedside- [facility] [place]- per choice [facility] to [facility]- RN available to see pt at 8pm tonight if pt stable for d/c- have paged MD to see if pt can be discharged today. HHRN has already been placed. Will await MD call for possible d/c today  Expected Discharge Date:     06/21/16             Expected Discharge Plan:  Home w Home Health Services  In-House Referral:     Discharge planning Services  CM Consult  Post Acute Care Choice:  Home Health Choice offered to:  Patient, Adult Children  DME Arranged:    DME Agency:     HH Arranged:  RN, IV Antibiotics HH Agency:  [facility]  Status of Service:  Completed, signed off  If discussed at Long Length of Stay Meetings, dates discussed:    Additional Comments:  06/21/16- 1600- Kierah Goatley RN, cM- received call back from Dr. Sunnie Nielsenegalado- [facility] will change abx to daily dose per ID so start of care for home IV abx will be tomorrow 9/20- [facility] for Home IV abx- and plan - she is to come speak with pt and daughter at bedside prior to discharge. Informed pt at bedside that [facility].   Darrold SpanWebster, Angalina Ante Hall, RN 06/21/2016, 3:36 PM

## 2016-06-21 NOTE — Discharge Summary (Signed)
Physician Discharge Summary  [name]:[id number] DOB: 03-20-1936 DOA: 06/13/2016  PCP: Pearson Grippe, MD  Admit date: 06/13/2016 Discharge date: 06/21/2016  Admitted From: Home Disposition:  Home  Recommendations for Outpatient Follow-up:  1. Follow up with PCP in 1-2 weeks 2. Please obtain BMP/CBC in one week   Home Health: RN   Discharge Condition: Stable.  CODE STATUS: Full code.  Diet recommendation: Heart Healthy   Brief/Interim Summary:  Brief Narrative: HISTORY OF PRESENT ILLNESS: 80 y.o.femaleadmitted 9/11 with presumed UGIB as well as shock w/ GNR (ECOLI) bacteremia. EGD showed findings c/w NSAID (9/12) related gastritis. I suspect that the bacteremia was d/t bacterial translocation from gut vs UT source. Will dc vanc, cont imipenem until sensitivities are available. She does have some delirium s/p EGD from meds but this is rapidly clearing. For today: adv diet, cont PPI, dc vanc dc NSAIDs, &cont primaxin. Will give lasix x 1 for what is likely ALI and follow CXR, Will order ECHO to assess for wall motion abnormality although suspect that trop bump is simply r/t sepsis. She will move to tele this afternoon if stays stable.   Assessment & Plan:  DISCUSSION: 80 y.o.femaleadmitted 9/11 with presumed UGIB as well as shock w/ GNR (ECOLI) bacteremia. EGD showed findings c/w NSAID (9/12) related gastritis. I suspect that the bacteremia was d/t bacterial translocation from gut vs UTI source.   She develops confusion s/p EGD thought initially secondary to medications for endoscopy. During work up for AMS, and confusion CT head showed calcified plaque in the vertebral artery. MRI was done which showed Acute stroke and occlusion of right PCA. Because of presentation with sepsis and Bacteremia and site of stroke neurology recommend TEE. Patient underwent TEE 9-19 which showed an oscillating density  in the aortic valve consistent with possible  vegetation. Discussed results with  Dr Ninetta Lights, will place Pic line and patient will need 6 weeks of IV antibiotics.   Hemorrhagic and septic shock-->resolved.  Hx HTN.  Mild troponin bump in setting of sepsis, trending down, ECHO with Normal EF Goal MAP > 65.  Possible aortic Valve Endocarditis, E coli.  bacteremia -->ecoli  Continue with ceftriaxone, Repeated Blood cultures; 9-16 no growth to date.  TEE with possible vegetation on aortic valve. Discussed with ID, patient will need 6 weeks of IV antibiotics.  ceftriaxone 2 gr daily for 6 weeks, and Levaquin daily for 6 weeks. Provide prescription for 2 weeks then call [facility] and provide 4 more weeks for levaquin  Will give prescription for florastore.   PNA, pleural effusion;  Repeat chest x ray 9-15. Improved pulmonary edema.  WBC trending down, pleural effusion decreasing.  On IV  ceftriaxone.    Acute stroke;  MRI with acute-subacute stroke, occlusion of Right PCA.  Neurology consulted, and recommending TEE. Discussed with Dr [name], ok to use baby aspirin.  Discussed results of TEE with neurology, will discontinue aspirin to avoid mycotic aneurysmal.  On statins.   HTN Continue with  norvasc to 5 mg daily   Acute blood loss anemia - from UGIB (NSAID related gastritis) Endoscopy: Normal esophagus.- Non-bleeding gastric ulcers with no stigmata of bleeding. Biopsied. - Multiple non-bleeding duodenal ulcers with no stigmata of bleeding. Cont daily cbc SCD's only. Dc asa NO NSAIDS F/u post-surgical path continue with protonix.    AKI-->improved Left renal cyst-->no hydro.  NAG metabolic acidosis in setting of hyperchloremia from volume resuscitation  Improved.   Acute hypoxic respiratory failure. CAP vs ALI -->  ALI in setting of bacteremia  Continue supplemental O2 as needed to maintain SpO2 > 92% received IV lasix 9-13 9-14 Improved.   Hx hypothyroidism. Adrenal insuff tsh ok; not on rx  stress dose steroids discontinue  9-14.    Constipation; Miralax ordered. Had BM . Resolved.    Discharge Diagnoses:  Principal Problem:   Sepsis (HCC) Active Problems:   Acute renal failure superimposed on stage 3 chronic kidney disease (HCC)   Essential hypertension   CAP (community acquired pneumonia)   Nausea vomiting and diarrhea   Neutropenia (HCC)   Anxiety   UGIB (upper gastrointestinal bleed)   Hypokalemia   Leukopenia   AKI (acute kidney injury) (HCC)   Dehydration   Encounter for central line placement   Difficult intravenous access   E coli bacteremia   Iron deficiency anemia due to chronic blood loss   Embolic stroke involving right posterior cerebral artery (HCC)   Confusion    Discharge Instructions  Discharge Instructions    Diet - low sodium heart healthy    Complete by:  As directed    Increase activity slowly    Complete by:  As directed        Medication List    STOP taking these medications   cloNIDine 0.1 MG tablet Commonly known as:  CATAPRES   naproxen sodium 220 MG tablet Commonly known as:  ANAPROX     TAKE these medications   amLODipine 5 MG tablet Commonly known as:  NORVASC Take 1 tablet (5 mg total) by mouth daily. Start taking on:  06/22/2016 What changed:  medication strength  how much to take   cefTRIAXone 2 g in dextrose 5 % 50 mL Inject 2 g into the vein daily. Start taking on:  06/22/2016   feeding supplement (ENSURE ENLIVE) Liqd Take 237 mLs by mouth 2 (two) times daily between meals. Start taking on:  06/22/2016   fish oil-omega-3 fatty acids 1000 MG capsule Take 1 g by mouth daily.   levofloxacin 250 MG tablet Commonly known as:  LEVAQUIN Take 1 tablet (250 mg total) by mouth daily.   LORazepam 0.5 MG tablet Commonly known as:  ATIVAN Take 0.5 mg by mouth 2 (two) times daily as needed. anxiety   metoprolol tartrate 25 MG tablet Commonly known as:  LOPRESSOR Take 25 mg by mouth 2 (two) times daily.   pantoprazole 40 MG tablet Commonly  known as:  PROTONIX Take 1 tablet (40 mg total) by mouth 2 (two) times daily.   saccharomyces boulardii 250 MG capsule Commonly known as:  FLORASTOR Take 1 capsule (250 mg total) by mouth 2 (two) times daily.   simvastatin 20 MG tablet Commonly known as:  ZOCOR Take 1 tablet (20 mg total) by mouth daily at 6 PM.      Follow-up Information    [facility] .   Why:  HHRN for IV abx x6wks Contact information: [address] [place] [address] [phone]        Pearson Grippe, MD .   Specialty:  Internal Medicine Contact information: [address] [place] [address] [phone]        Johny Sax, MD Follow up in 2 week(s).   Specialty:  Infectious Diseases Contact information: [address] [place] [address] [phone]        Xu,Jindong, MD Follow up in 2 week(s).   Specialty:  Neurology Contact information: [address]  [place] [address] [phone]          Allergies  Allergen Reactions  . Amoxicillin Hives and Itching    Has patient had a PCN reaction causing immediate rash, facial/tongue/throat swelling, SOB or lightheadedness with hypotension: Yes Has patient had a PCN reaction causing severe rash involving mucus membranes or skin necrosis: Yes Has patient had a PCN reaction that required hospitalization No Has patient had a PCN reaction occurring within the last 10 years: Yes If all of the above answers are "NO", then may proceed with Cephalosporin use.     Consultations: ID  Neurology   Procedures/Studies: Dg Chest 2 View  Result Date: 06/17/2016 CLINICAL DATA:  Followup pleural effusion. EXAM: CHEST  2 VIEW COMPARISON:  06/15/2016 FINDINGS: Normal heart size. Bilateral pleural effusions are identified which appear stable to decreased in volume from previous exam. Interval improvement in pulmonary edema. IMPRESSION: Improving pulmonary edema with stable to  decrease in volume of pleural effusions. Electronically Signed   By: Signa Kell M.D.   On: 06/17/2016 09:04   Ct Head Wo Contrast  Result Date: 06/15/2016 CLINICAL DATA:  80 year old female with sudden onset of confusion this morning. No associated head pain. EXAM: CT HEAD WITHOUT CONTRAST TECHNIQUE: Contiguous axial images were obtained from the base of the skull through the vertex without intravenous contrast. COMPARISON:  Head CT 12/08/2009. FINDINGS: Brain: Mild cerebral atrophy. Patchy and confluent areas of decreased attenuation are noted throughout the deep and periventricular white matter of the cerebral hemispheres bilaterally, compatible with chronic microvascular ischemic disease. No evidence of acute infarction, hemorrhage, hydrocephalus, extra-axial collection or mass lesion/mass effect. Vascular: No hyperdense vessel. Calcified atherosclerotic plaque in the right vertebral artery (image 3 of series 2), new compared to the prior examination. Based on the appearance, there may be a hemodynamically significant stenosis in this vessel. Skull: Normal. Negative for fracture or focal lesion. Sinuses/Orbits: No acute finding. Other: None. IMPRESSION: 1. No definite acute intracranial abnormalities. 2. There is a new densely calcified plaque in the right vertebral artery (as above) compared to prior study from 12/08/2009. Although not assessable on today's noncontrast CT examination, the appearance could suggest a hemodynamically significant stenosis. If there are signs or symptoms of vertebrobasilar insufficiency, further evaluation with noncontrast brain MRI/MRA could be considered. 3. Mild cerebral atrophy with mild chronic microvascular ischemic changes in the cerebral white matter, as above. Electronically Signed   By: Trudie Reed M.D.   On: 06/15/2016 12:23   [name] Head Wo Contrast  Result Date: 06/16/2016 CLINICAL DATA:  Initial valuation for delirium. Evaluate for stroke. EXAM: MRI  HEAD WITHOUT CONTRAST MRA HEAD WITHOUT CONTRAST TECHNIQUE: Multiplanar, multiecho pulse sequences of the brain and surrounding structures were obtained without intravenous contrast. Angiographic images of the head were obtained using MRA technique without contrast. COMPARISON:  Prior CT 07/15/2016. FINDINGS: MRI HEAD FINDINGS Mild diffuse prominence of the CSF containing spaces is compatible with generalized cerebral atrophy, within normal limits for patient age. Patchy and confluent T2/FLAIR hyperintensity within the periventricular and deep white matter both cerebral hemispheres most compatible with chronic microvascular ischemic disease, moderate in nature. There are patchy multi vocal folds ladder of restricted diffusion involving the right PCA territory, involving the right occipital lobe and mesial right temporal lobe, with additional 0 punctate infarct within the right thalamus. Associated T2/FLAIR signal abnormality. ADC signal appears to be somewhat normalizing at this point, compatible with acute/early subacute infarct. No associated hemorrhage or mass effect. No other acute infarct. Gray-white  matter differentiation otherwise maintained. Major intracranial vascular flow voids are grossly preserved. Left vertebral artery appears to be hypoplastic. Prominent atheromatous plaque at the right V4 segment. No mass lesion, midline shift, or mass effect. No hydrocephalus. No extra-axial fluid collection. Major dural sinuses are grossly patent. Craniocervical junction normal. Visualized upper cervical spine unremarkable. Pituitary gland within normal limits. No acute abnormality about the globes and orbits. Patient is status post lens extraction bilaterally. Paranasal sinuses are clear. Mild scattered opacity within the bilateral mastoid air cells, of doubtful clinical significance. Inner ear structures grossly normal. Bone marrow signal intensity within normal limits. No scalp soft tissue abnormality. MRA HEAD  FINDINGS ANTERIOR CIRCULATION: Visualized distal cervical segments of the internal carotid arteries are widely patent with antegrade flow. Petrous, cavernous, and supraclinoid segments widely patent without flow-limiting stenosis. A1 segments patent bilaterally. The left A1 segment is hypoplastic. Anterior communicating artery normal. Short-segment severe stenosis left A2 segment present (series 5, image 64). Right ACA demonstrates mild irregularity without severe stenosis. M1 segments demonstrate mild atheromatous irregularity. Mild smooth narrowing of the distal M1 segments bilaterally without focal high-grade stenosis. Distal small vessel atheromatous irregularity within the MCA branches bilaterally. POSTERIOR CIRCULATION: Right vertebral artery dominant and patent to the vertebrobasilar junction without focal high-grade stenosis. Hypoplastic left vertebral artery terminates in PICA. Posterior inferior cerebral arteries themselves are patent proximally. Basilar artery demonstrates mild multi focal atheromatous irregularity and stenoses. Superior cerebral arteries patent bilaterally. Basilar tip mildly ectatic without focal aneurysm. Left P1 segment somewhat hypoplastic with a prominent left posterior communicating artery. Multifocal atheromatous irregularity with tandem severe nonocclusive stenoses within the left PCA (series 554, image 5). Left PCA is opacified to its distal aspect. There is fetal origin of the right PCA supplied via a patent right posterior communicating artery. Right PCA 8 attenuated proximally Ing and is not seen distally, suspected to be occluded given the right PCA infarcts. No aneurysm or vascular malformation. IMPRESSION: MRI HEAD IMPRESSION: 1. Acute/early subacute ischemic nonhemorrhagic right PCA territory infarcts. 2. Moderate chronic microvascular ischemic disease. MRA HEAD IMPRESSION: 1. Occlusion of the distal right PCA. Note is made of a fetal type right PCA. 2. Atheromatous  irregularity with severe tandem nonocclusive the stenoses within the left PCA. 3. Severe nonocclusive left A2 stenosis. 4. Additional more mild atheromatous disease involving the anterior posterior circulation as above. Electronically Signed   By: Rise Mu M.D.   On: 06/16/2016 12:53   Mr Brain Wo Contrast  Result Date: 06/16/2016 CLINICAL DATA:  Initial valuation for delirium. Evaluate for stroke. EXAM: MRI HEAD WITHOUT CONTRAST MRA HEAD WITHOUT CONTRAST TECHNIQUE: Multiplanar, multiecho pulse sequences of the brain and surrounding structures were obtained without intravenous contrast. Angiographic images of the head were obtained using MRA technique without contrast. COMPARISON:  Prior CT 07/15/2016. FINDINGS: MRI HEAD FINDINGS Mild diffuse prominence of the CSF containing spaces is compatible with generalized cerebral atrophy, within normal limits for patient age. Patchy and confluent T2/FLAIR hyperintensity within the periventricular and deep white matter both cerebral hemispheres most compatible with chronic microvascular ischemic disease, moderate in nature. There are patchy multi vocal folds ladder of restricted diffusion involving the right PCA territory, involving the right occipital lobe and mesial right temporal lobe, with additional 0 punctate infarct within the right thalamus. Associated T2/FLAIR signal abnormality. ADC signal appears to be somewhat normalizing at this point, compatible with acute/early subacute infarct. No associated hemorrhage or mass effect. No other acute infarct. Gray-white matter differentiation otherwise maintained. Major intracranial vascular flow  voids are grossly preserved. Left vertebral artery appears to be hypoplastic. Prominent atheromatous plaque at the right V4 segment. No mass lesion, midline shift, or mass effect. No hydrocephalus. No extra-axial fluid collection. Major dural sinuses are grossly patent. Craniocervical junction normal. Visualized upper  cervical spine unremarkable. Pituitary gland within normal limits. No acute abnormality about the globes and orbits. Patient is status post lens extraction bilaterally. Paranasal sinuses are clear. Mild scattered opacity within the bilateral mastoid air cells, of doubtful clinical significance. Inner ear structures grossly normal. Bone marrow signal intensity within normal limits. No scalp soft tissue abnormality. MRA HEAD FINDINGS ANTERIOR CIRCULATION: Visualized distal cervical segments of the internal carotid arteries are widely patent with antegrade flow. Petrous, cavernous, and supraclinoid segments widely patent without flow-limiting stenosis. A1 segments patent bilaterally. The left A1 segment is hypoplastic. Anterior communicating artery normal. Short-segment severe stenosis left A2 segment present (series 5, image 64). Right ACA demonstrates mild irregularity without severe stenosis. M1 segments demonstrate mild atheromatous irregularity. Mild smooth narrowing of the distal M1 segments bilaterally without focal high-grade stenosis. Distal small vessel atheromatous irregularity within the MCA branches bilaterally. POSTERIOR CIRCULATION: Right vertebral artery dominant and patent to the vertebrobasilar junction without focal high-grade stenosis. Hypoplastic left vertebral artery terminates in PICA. Posterior inferior cerebral arteries themselves are patent proximally. Basilar artery demonstrates mild multi focal atheromatous irregularity and stenoses. Superior cerebral arteries patent bilaterally. Basilar tip mildly ectatic without focal aneurysm. Left P1 segment somewhat hypoplastic with a prominent left posterior communicating artery. Multifocal atheromatous irregularity with tandem severe nonocclusive stenoses within the left PCA (series 554, image 5). Left PCA is opacified to its distal aspect. There is fetal origin of the right PCA supplied via a patent right posterior communicating artery. Right PCA 8  attenuated proximally Ing and is not seen distally, suspected to be occluded given the right PCA infarcts. No aneurysm or vascular malformation. IMPRESSION: MRI HEAD IMPRESSION: 1. Acute/early subacute ischemic nonhemorrhagic right PCA territory infarcts. 2. Moderate chronic microvascular ischemic disease. MRA HEAD IMPRESSION: 1. Occlusion of the distal right PCA. Note is made of a fetal type right PCA. 2. Atheromatous irregularity with severe tandem nonocclusive the stenoses within the left PCA. 3. Severe nonocclusive left A2 stenosis. 4. Additional more mild atheromatous disease involving the anterior posterior circulation as above. Electronically Signed   By: Rise Mu M.D.   On: 06/16/2016 12:53   US Renal  Result Date: 06/14/2016 CLINICAL DATA:  Acute kidney injury superimposed on chronic kidney disease. EXAM: RENAL / URINARY TRACT ULTRASOUND COMPLETE COMPARISON:  CT abdomen/ pelvis 07/30/2015 FINDINGS: Right Kidney: Length: 10.1 cm. Borderline increased renal echogenicity. No mass or hydronephrosis visualized. Left Kidney: Length: 9.7 cm. Borderline increased renal echogenicity. No hydronephrosis visualized. Simple cyst in the lower right kidney measures 1.4 x 1.3 x 1.2 cm. Cyst in the upper pole measures 0.8 x 0.8 x 1.0 cm, suggestion of mild complexity likely due to small size. Bladder: Appears normal for degree of bladder distention. IMPRESSION: 1. No obstructive uropathy. Borderline increased renal echogenicity suggesting chronic medical renal disease. 2. Left renal cysts. Electronically Signed   By: Rubye Oaks M.D.   On: 06/14/2016 02:51   Dg Chest Port 1 View  Result Date: 06/15/2016 CLINICAL DATA:  ARDS, weakness, hypertension EXAM: PORTABLE CHEST 1 VIEW COMPARISON:  Portable exam 0636 hours compared to 06/14/2006 D FINDINGS: RIGHT jugular central venous catheter with tip projecting over SVC. Rotation to LEFT. Enlargement of cardiac silhouette with pulmonary vascular congestion.  RIGHT pleural  effusion present. Atelectasis versus consolidation in BILATERAL lower lobes. No pneumothorax. Bones demineralized. IMPRESSION: Persistent bibasilar atelectasis versus consolidation with increased RIGHT pleural effusion. Enlargement of cardiac silhouette. Electronically Signed   By: Ulyses Southward M.D.   On: 06/15/2016 08:03   Dg Chest Port 1 View  Result Date: 06/14/2016 CLINICAL DATA:  Respiratory failure, shortness of breath, sepsis and community-acquired pneumonia peer EXAM: PORTABLE CHEST 1 VIEW COMPARISON:  Portable chest x-ray of June 13, 2016 FINDINGS: The lungs are well-expanded. There is persistent bibasilar atelectasis or pneumonia. Small bilateral pleural effusions are present. The heart is top-normal in size but stable. The central pulmonary vascularity is prominent. The right internal jugular venous catheter tip projects over the midportion of the SVC. IMPRESSION: Slight interval worsening of bibasilar atelectasis or pneumonia. Stable small bilateral pleural effusions. Mild pulmonary vascular congestion slightly more conspicuous today. Electronically Signed   By: David  [place] M.D.   On: 06/14/2016 07:22   Dg Chest Port 1 View  Result Date: 06/13/2016 CLINICAL DATA:  Central line placement EXAM: PORTABLE CHEST 1 VIEW COMPARISON:  Chest radiograph from earlier today. FINDINGS: Right internal jugular central venous catheter terminates at the cavoatrial junction. Stable cardiomediastinal silhouette with mild cardiomegaly. No pneumothorax. Trace bilateral pleural effusions, probably stable. Borderline mild pulmonary edema, slightly worsened. Patchy mild left lung base opacity appears stable. IMPRESSION: 1. Right internal jugular central venous catheter terminates at the cavoatrial junction. No pneumothorax. 2. Borderline mild congestive heart failure, slightly worsened. 3. Probable stable trace bilateral pleural effusions. 4. Stable patchy left lung base opacity, favor atelectasis.  Electronically Signed   By: Delbert Phenix M.D.   On: 06/13/2016 16:51   Dg Chest Port 1 View  Result Date: 06/13/2016 CLINICAL DATA:  Initial evaluation for acute cough, fever, nausea and vomiting. EXAM: PORTABLE CHEST 1 VIEW COMPARISON:  Prior radiograph from 01/14/2016. FINDINGS: The cardiac and mediastinal silhouettes are stable in size and contour, and remain within normal limits. Lungs are normally inflated. Changes related to COPD present. Subtle patchy density at the left lung base, which may reflect atelectasis or possibly developing infiltrate. No other focal airspace disease. No pulmonary edema or pleural effusion. No pneumothorax. No acute osseous abnormality identified. Surgical clips overlie the lower neck. IMPRESSION: 1. Mild patchy left basilar opacity. While this finding could reflect atelectasis, possible mild/developing infiltrate could be considered. 2. COPD. Electronically Signed   By: Rise Mu M.D.   On: 06/13/2016 03:11   Mm Digital Screening Bilateral  Result Date: 06/02/2016 CLINICAL DATA:  Screening. EXAM: DIGITAL SCREENING BILATERAL MAMMOGRAM WITH CAD COMPARISON:  None. Patient worksheet indicates previous mammogram greater than 13 years ago and unavailable. ACR Breast Density Category c: The breast tissue is heterogeneously dense, which may obscure small masses. FINDINGS: In the right breast, calcifications warrant further evaluation with magnified views. In the left breast, no findings suspicious for malignancy. Images were processed with CAD. IMPRESSION: Further evaluation is suggested for calcifications in the right breast. RECOMMENDATION: Diagnostic mammogram of the right breast. (Code:[id number]) The patient will be contacted regarding the findings, and additional imaging will be scheduled. BI-RADS CATEGORY  0: Incomplete. Need additional imaging evaluation and/or prior mammograms for comparison. Electronically Signed   By: Bary Richard M.D.   On: 06/02/2016 15:21     (Echo, Carotid, EGD, Colonoscopy, ERCP)    Subjective:   Discharge Exam: Vitals:   06/21/16 1235 06/21/16 1257  BP: (!) 155/58 (!) 139/51  Pulse: 74 70  Resp: 14 15  Temp:  98.1 F (36.7 C)   Vitals:   06/21/16 1215 06/21/16 1220 06/21/16 1235 06/21/16 1257  BP: (!) 181/83 (!) 162/78 (!) 155/58 (!) 139/51  Pulse: 73 72 74 70  Resp: 15 16 14 15   Temp:    98.1 F (36.7 C)  TempSrc:    Oral  SpO2: 99% 100% 100% 97%  Weight:      Height:        General: Pt is alert, awake, not in acute distress Cardiovascular: RRR, S1/S2 +, no rubs, no gallops Respiratory: CTA bilaterally, no wheezing, no rhonchi Abdominal: Soft, NT, ND, bowel sounds + Extremities: no edema, no cyanosis    The results of significant diagnostics from this hospitalization (including imaging, microbiology, ancillary and laboratory) are listed below for reference.     Microbiology: Recent Results (from the past 240 hour(s))  Blood Culture (routine x 2)     Status: Abnormal   Collection Time: 06/13/16  1:28 AM  Result Value Ref Range Status   Specimen Description BLOOD LEFT ARM  Final   Special Requests BOTTLES DRAWN AEROBIC AND ANAEROBIC  Final   Culture  Setup Time   Final    GRAM NEGATIVE RODS IN BOTH AEROBIC AND ANAEROBIC BOTTLES CRITICAL RESULT CALLED TO, READ BACK BY AND VERIFIED WITH: T.EGAN, PHARMD AT1720 ON 06/13/16 BY C. JESSUP, MLT.    Culture (A)  Final    ESCHERICHIA COLI SUSCEPTIBILITIES PERFORMED ON PREVIOUS CULTURE WITHIN THE LAST 5 DAYS.    Report Status 06/16/2016 FINAL  Final   Organism ID, Bacteria ESCHERICHIA COLI  Final      Susceptibility   Escherichia coli - MIC*    AMPICILLIN <=2 SENSITIVE Sensitive     CEFAZOLIN <=4 SENSITIVE Sensitive     CEFEPIME <=1 SENSITIVE Sensitive     CEFTAZIDIME <=1 SENSITIVE Sensitive     CEFTRIAXONE <=1 SENSITIVE Sensitive     CIPROFLOXACIN <=0.25 SENSITIVE Sensitive     GENTAMICIN <=1 SENSITIVE Sensitive     IMIPENEM <=0.25  SENSITIVE Sensitive     TRIMETH/SULFA <=20 SENSITIVE Sensitive     AMPICILLIN/SULBACTAM <=2 SENSITIVE Sensitive     PIP/TAZO <=4 SENSITIVE Sensitive     Extended ESBL NEGATIVE Sensitive     * ESCHERICHIA COLI  Blood Culture ID Panel (Reflexed)     Status: Abnormal   Collection Time: 06/13/16  1:28 AM  Result Value Ref Range Status   Enterococcus species NOT DETECTED NOT DETECTED Final   Listeria monocytogenes NOT DETECTED NOT DETECTED Final   Staphylococcus species NOT DETECTED NOT DETECTED Final   Staphylococcus aureus NOT DETECTED NOT DETECTED Final   Streptococcus species NOT DETECTED NOT DETECTED Final   Streptococcus agalactiae NOT DETECTED NOT DETECTED Final   Streptococcus pneumoniae NOT DETECTED NOT DETECTED Final   Streptococcus pyogenes NOT DETECTED NOT DETECTED Final   Acinetobacter baumannii NOT DETECTED NOT DETECTED Final   Enterobacteriaceae species DETECTED (A) NOT DETECTED Final    Comment: CRITICAL RESULT CALLED TO, READ BACK BY AND VERIFIED WITH: T. EGAN, PHARMD AT 1720 ON 06/13/16 BY C. JESSUP, MLT.    Enterobacter cloacae complex NOT DETECTED NOT DETECTED Final   Escherichia coli DETECTED (A) NOT DETECTED Final    Comment: CRITICAL RESULT CALLED TO, READ BACK BY AND VERIFIED WITH: T. EGAN, PHARMD AT 1720 ON 06/13/16 BY C. JESSUP, MLT.    Klebsiella oxytoca NOT DETECTED NOT DETECTED Final   Klebsiella pneumoniae NOT DETECTED NOT DETECTED Final   Proteus species NOT DETECTED NOT DETECTED  Final   Serratia marcescens NOT DETECTED NOT DETECTED Final   Carbapenem resistance NOT DETECTED NOT DETECTED Final   Haemophilus influenzae NOT DETECTED NOT DETECTED Final   Neisseria meningitidis NOT DETECTED NOT DETECTED Final   Pseudomonas aeruginosa NOT DETECTED NOT DETECTED Final   Candida albicans NOT DETECTED NOT DETECTED Final   Candida glabrata NOT DETECTED NOT DETECTED Final   Candida krusei NOT DETECTED NOT DETECTED Final   Candida parapsilosis NOT DETECTED NOT  DETECTED Final   Candida tropicalis NOT DETECTED NOT DETECTED Final  Urine culture     Status: Abnormal   Collection Time: 06/13/16  1:55 AM  Result Value Ref Range Status   Specimen Description URINE, RANDOM  Final   Special Requests NONE  Final   Culture >=100,000 COLONIES/mL ESCHERICHIA COLI (A)  Final   Report Status 06/15/2016 FINAL  Final   Organism ID, Bacteria ESCHERICHIA COLI (A)  Final      Susceptibility   Escherichia coli - MIC*    AMPICILLIN <=2 SENSITIVE Sensitive     CEFAZOLIN <=4 SENSITIVE Sensitive     CEFTRIAXONE <=1 SENSITIVE Sensitive     CIPROFLOXACIN <=0.25 SENSITIVE Sensitive     GENTAMICIN <=1 SENSITIVE Sensitive     IMIPENEM <=0.25 SENSITIVE Sensitive     NITROFURANTOIN <=16 SENSITIVE Sensitive     TRIMETH/SULFA <=20 SENSITIVE Sensitive     AMPICILLIN/SULBACTAM <=2 SENSITIVE Sensitive     PIP/TAZO <=4 SENSITIVE Sensitive     Extended ESBL NEGATIVE Sensitive     * >=100,000 COLONIES/mL ESCHERICHIA COLI  Blood Culture (routine x 2)     Status: Abnormal   Collection Time: 06/13/16  2:15 AM  Result Value Ref Range Status   Specimen Description BLOOD LEFT WRIST  Final   Special Requests IN PEDIATRIC BOTTLE  Final   Culture  Setup Time   Final    GRAM NEGATIVE RODS AEROBIC BOTTLE ONLY CRITICAL RESULT CALLED TO, READ BACK BY AND VERIFIED WITH: T. EGAN, PHARMD AT 1720 ON 06/13/16 BY C. JESSUP, MLT.    Culture (A)  Final    ESCHERICHIA COLI SUSCEPTIBILITIES PERFORMED ON PREVIOUS CULTURE WITHIN THE LAST 5 DAYS.    Report Status 06/15/2016 FINAL  Final  Respiratory Panel by PCR     Status: None   Collection Time: 06/13/16  9:19 AM  Result Value Ref Range Status   Adenovirus NOT DETECTED NOT DETECTED Final   Coronavirus 229E NOT DETECTED NOT DETECTED Final   Coronavirus HKU1 NOT DETECTED NOT DETECTED Final   Coronavirus NL63 NOT DETECTED NOT DETECTED Final   Coronavirus OC43 NOT DETECTED NOT DETECTED Final   Metapneumovirus NOT DETECTED NOT DETECTED  Final   Rhinovirus / Enterovirus NOT DETECTED NOT DETECTED Final   Influenza A NOT DETECTED NOT DETECTED Final   Influenza B NOT DETECTED NOT DETECTED Final   Parainfluenza Virus 1 NOT DETECTED NOT DETECTED Final   Parainfluenza Virus 2 NOT DETECTED NOT DETECTED Final   Parainfluenza Virus 3 NOT DETECTED NOT DETECTED Final   Parainfluenza Virus 4 NOT DETECTED NOT DETECTED Final   Respiratory Syncytial Virus NOT DETECTED NOT DETECTED Final   Bordetella pertussis NOT DETECTED NOT DETECTED Final   Chlamydophila pneumoniae NOT DETECTED NOT DETECTED Final   Mycoplasma pneumoniae NOT DETECTED NOT DETECTED Final  Culture, blood (routine x 2)     Status: None (Preliminary result)   Collection Time: 06/17/16  6:07 PM  Result Value Ref Range Status   Specimen Description BLOOD LEFT ANTECUBITAL  Final   Special Requests BOTTLES DRAWN AEROBIC AND ANAEROBIC 5CC  Final   Culture NO GROWTH 4 DAYS  Final   Report Status PENDING  Incomplete  Culture, blood (routine x 2)     Status: None (Preliminary result)   Collection Time: 06/17/16  6:18 PM  Result Value Ref Range Status   Specimen Description BLOOD LEFT HAND  Final   Special Requests BOTTLES DRAWN AEROBIC AND ANAEROBIC 5CC  Final   Culture NO GROWTH 4 DAYS  Final   Report Status PENDING  Incomplete     Labs: BNP (last 3 results) No results for input(s): BNP in the last 8760 hours. Basic Metabolic Panel:  Recent Labs Lab 06/15/16 0747 06/16/16 0437 06/17/16 0340 06/18/16 0305 06/19/16 0316 06/20/16 0246  NA 141 145 143 140 140 139  K 3.4* 3.8 3.3* 4.0 4.1 4.0  CL 117* 111 104 104 105 102  CO2 17* 27 29 30 28 28   GLUCOSE 154* 131* 96 107* 102* 101*  BUN 28* 21* 19 16 22* 19  CREATININE 1.13* 1.09* 1.03* 1.02* 0.94 0.96  CALCIUM 8.8* 9.1 9.0 9.2 9.6 10.0  MG 1.7  --   --   --   --   --    Liver Function Tests:  Recent Labs Lab 06/15/16 0747  AST 49*  ALT 95*  ALKPHOS 90  BILITOT 0.6  PROT 5.0*  ALBUMIN 2.4*   No results  for input(s): LIPASE, AMYLASE in the last 168 hours. No results for input(s): AMMONIA in the last 168 hours. CBC:  Recent Labs Lab 06/15/16 0747 06/16/16 0437 06/17/16 0340 06/19/16 0316 06/20/16 0246  WBC 19.5* 18.1* 11.7* 8.3 9.4  NEUTROABS  --  16.4*  --   --   --   HGB 11.6* 12.5 13.1 12.0 12.9  HCT 35.0* 37.5 39.7 37.3 40.2  MCV 90.7 92.6 92.1 94.7 94.8  PLT 211 265 246 234 245   Cardiac Enzymes:  Recent Labs Lab 06/15/16 1106 06/15/16 1524 06/15/16 2147  TROPONINI 0.66* 0.53* 0.40*   BNP: Invalid input(s): POCBNP CBG: No results for input(s): GLUCAP in the last 168 hours. D-Dimer No results for input(s): DDIMER in the last 72 hours. Hgb A1c No results for input(s): HGBA1C in the last 72 hours. Lipid Profile No results for input(s): CHOL, HDL, LDLCALC, TRIG, CHOLHDL, LDLDIRECT in the last 72 hours. Thyroid function studies No results for input(s): TSH, T4TOTAL, T3FREE, THYROIDAB in the last 72 hours.  Invalid input(s): FREET3 Anemia work up No results for input(s): VITAMINB12, FOLATE, FERRITIN, TIBC, IRON, RETICCTPCT in the last 72 hours. Urinalysis    Component Value Date/Time   COLORURINE YELLOW 06/13/2016 0155   APPEARANCEUR CLEAR 06/13/2016 0155   LABSPEC 1.014 06/13/2016 0155   PHURINE 6.0 06/13/2016 0155   GLUCOSEU NEGATIVE 06/13/2016 0155   HGBUR TRACE (A) 06/13/2016 0155   BILIRUBINUR NEGATIVE 06/13/2016 0155   KETONESUR 15 (A) 06/13/2016 0155   PROTEINUR >300 (A) 06/13/2016 0155   UROBILINOGEN 0.2 12/08/2009 1141   NITRITE NEGATIVE 06/13/2016 0155   LEUKOCYTESUR NEGATIVE 06/13/2016 0155   Sepsis Labs Invalid input(s): PROCALCITONIN,  WBC,  LACTICIDVEN Microbiology Recent Results (from the past 240 hour(s))  Blood Culture (routine x 2)     Status: Abnormal   Collection Time: 06/13/16  1:28 AM  Result Value Ref Range Status   Specimen Description BLOOD LEFT ARM  Final   Special Requests BOTTLES DRAWN AEROBIC AND ANAEROBIC  Final    Culture  Setup  Time   Final    GRAM NEGATIVE RODS IN BOTH AEROBIC AND ANAEROBIC BOTTLES CRITICAL RESULT CALLED TO, READ BACK BY AND VERIFIED WITH: T.EGAN, PHARMD AT1720 ON 06/13/16 BY C. JESSUP, MLT.    Culture (A)  Final    ESCHERICHIA COLI SUSCEPTIBILITIES PERFORMED ON PREVIOUS CULTURE WITHIN THE LAST 5 DAYS.    Report Status 06/16/2016 FINAL  Final   Organism ID, Bacteria ESCHERICHIA COLI  Final      Susceptibility   Escherichia coli - MIC*    AMPICILLIN <=2 SENSITIVE Sensitive     CEFAZOLIN <=4 SENSITIVE Sensitive     CEFEPIME <=1 SENSITIVE Sensitive     CEFTAZIDIME <=1 SENSITIVE Sensitive     CEFTRIAXONE <=1 SENSITIVE Sensitive     CIPROFLOXACIN <=0.25 SENSITIVE Sensitive     GENTAMICIN <=1 SENSITIVE Sensitive     IMIPENEM <=0.25 SENSITIVE Sensitive     TRIMETH/SULFA <=20 SENSITIVE Sensitive     AMPICILLIN/SULBACTAM <=2 SENSITIVE Sensitive     PIP/TAZO <=4 SENSITIVE Sensitive     Extended ESBL NEGATIVE Sensitive     * ESCHERICHIA COLI  Blood Culture ID Panel (Reflexed)     Status: Abnormal   Collection Time: 06/13/16  1:28 AM  Result Value Ref Range Status   Enterococcus species NOT DETECTED NOT DETECTED Final   Listeria monocytogenes NOT DETECTED NOT DETECTED Final   Staphylococcus species NOT DETECTED NOT DETECTED Final   Staphylococcus aureus NOT DETECTED NOT DETECTED Final   Streptococcus species NOT DETECTED NOT DETECTED Final   Streptococcus agalactiae NOT DETECTED NOT DETECTED Final   Streptococcus pneumoniae NOT DETECTED NOT DETECTED Final   Streptococcus pyogenes NOT DETECTED NOT DETECTED Final   Acinetobacter baumannii NOT DETECTED NOT DETECTED Final   Enterobacteriaceae species DETECTED (A) NOT DETECTED Final    Comment: CRITICAL RESULT CALLED TO, READ BACK BY AND VERIFIED WITH: T. EGAN, PHARMD AT 1720 ON 06/13/16 BY C. JESSUP, MLT.    Enterobacter cloacae complex NOT DETECTED NOT DETECTED Final   Escherichia coli DETECTED (A) NOT DETECTED Final    Comment:  CRITICAL RESULT CALLED TO, READ BACK BY AND VERIFIED WITH: T. EGAN, PHARMD AT 1720 ON 06/13/16 BY C. JESSUP, MLT.    Klebsiella oxytoca NOT DETECTED NOT DETECTED Final   Klebsiella pneumoniae NOT DETECTED NOT DETECTED Final   Proteus species NOT DETECTED NOT DETECTED Final   Serratia marcescens NOT DETECTED NOT DETECTED Final   Carbapenem resistance NOT DETECTED NOT DETECTED Final   Haemophilus influenzae NOT DETECTED NOT DETECTED Final   Neisseria meningitidis NOT DETECTED NOT DETECTED Final   Pseudomonas aeruginosa NOT DETECTED NOT DETECTED Final   Candida albicans NOT DETECTED NOT DETECTED Final   Candida glabrata NOT DETECTED NOT DETECTED Final   Candida krusei NOT DETECTED NOT DETECTED Final   Candida parapsilosis NOT DETECTED NOT DETECTED Final   Candida tropicalis NOT DETECTED NOT DETECTED Final  Urine culture     Status: Abnormal   Collection Time: 06/13/16  1:55 AM  Result Value Ref Range Status   Specimen Description URINE, RANDOM  Final   Special Requests NONE  Final   Culture >=100,000 COLONIES/mL ESCHERICHIA COLI (A)  Final   Report Status 06/15/2016 FINAL  Final   Organism ID, Bacteria ESCHERICHIA COLI (A)  Final      Susceptibility   Escherichia coli - MIC*    AMPICILLIN <=2 SENSITIVE Sensitive     CEFAZOLIN <=4 SENSITIVE Sensitive     CEFTRIAXONE <=1 SENSITIVE Sensitive     CIPROFLOXACIN <=  0.25 SENSITIVE Sensitive     GENTAMICIN <=1 SENSITIVE Sensitive     IMIPENEM <=0.25 SENSITIVE Sensitive     NITROFURANTOIN <=16 SENSITIVE Sensitive     TRIMETH/SULFA <=20 SENSITIVE Sensitive     AMPICILLIN/SULBACTAM <=2 SENSITIVE Sensitive     PIP/TAZO <=4 SENSITIVE Sensitive     Extended ESBL NEGATIVE Sensitive     * >=100,000 COLONIES/mL ESCHERICHIA COLI  Blood Culture (routine x 2)     Status: Abnormal   Collection Time: 06/13/16  2:15 AM  Result Value Ref Range Status   Specimen Description BLOOD LEFT WRIST  Final   Special Requests IN PEDIATRIC BOTTLE  Final    Culture  Setup Time   Final    GRAM NEGATIVE RODS AEROBIC BOTTLE ONLY CRITICAL RESULT CALLED TO, READ BACK BY AND VERIFIED WITH: T. EGAN, PHARMD AT 1720 ON 06/13/16 BY C. JESSUP, MLT.    Culture (A)  Final    ESCHERICHIA COLI SUSCEPTIBILITIES PERFORMED ON PREVIOUS CULTURE WITHIN THE LAST 5 DAYS.    Report Status 06/15/2016 FINAL  Final  Respiratory Panel by PCR     Status: None   Collection Time: 06/13/16  9:19 AM  Result Value Ref Range Status   Adenovirus NOT DETECTED NOT DETECTED Final   Coronavirus 229E NOT DETECTED NOT DETECTED Final   Coronavirus HKU1 NOT DETECTED NOT DETECTED Final   Coronavirus NL63 NOT DETECTED NOT DETECTED Final   Coronavirus OC43 NOT DETECTED NOT DETECTED Final   Metapneumovirus NOT DETECTED NOT DETECTED Final   Rhinovirus / Enterovirus NOT DETECTED NOT DETECTED Final   Influenza A NOT DETECTED NOT DETECTED Final   Influenza B NOT DETECTED NOT DETECTED Final   Parainfluenza Virus 1 NOT DETECTED NOT DETECTED Final   Parainfluenza Virus 2 NOT DETECTED NOT DETECTED Final   Parainfluenza Virus 3 NOT DETECTED NOT DETECTED Final   Parainfluenza Virus 4 NOT DETECTED NOT DETECTED Final   Respiratory Syncytial Virus NOT DETECTED NOT DETECTED Final   Bordetella pertussis NOT DETECTED NOT DETECTED Final   Chlamydophila pneumoniae NOT DETECTED NOT DETECTED Final   Mycoplasma pneumoniae NOT DETECTED NOT DETECTED Final  Culture, blood (routine x 2)     Status: None (Preliminary result)   Collection Time: 06/17/16  6:07 PM  Result Value Ref Range Status   Specimen Description BLOOD LEFT ANTECUBITAL  Final   Special Requests BOTTLES DRAWN AEROBIC AND ANAEROBIC 5CC  Final   Culture NO GROWTH 4 DAYS  Final   Report Status PENDING  Incomplete  Culture, blood (routine x 2)     Status: None (Preliminary result)   Collection Time: 06/17/16  6:18 PM  Result Value Ref Range Status   Specimen Description BLOOD LEFT HAND  Final   Special Requests BOTTLES DRAWN AEROBIC AND  ANAEROBIC 5CC  Final   Culture NO GROWTH 4 DAYS  Final   Report Status PENDING  Incomplete     Time coordinating discharge: Over 30 minutes  SIGNED:   Alba Cory, MD  [facility] 06/21/2016, 3:53 PM Pager   If 7PM-7AM, please contact night-coverage [email or web address] Password [email or web address]

## 2016-06-21 NOTE — Progress Notes (Signed)
  Echocardiogram Echocardiogram Transesophageal has been performed.  Arvil ChacoFoster, Mry Lamia 06/21/2016, 12:35 PM

## 2016-06-21 NOTE — Progress Notes (Signed)
STROKE TEAM PROGRESS NOTE   HISTORY OF PRESENT ILLNESS (per record) [name] is an 80 y.o. female brought to hospital with UGIB and GNR bacteremia. Patient has had encephalopathy since admission. MRI was obtained for Delirium while in hospital and revealed a right PCA infarct. MRA showed Occlusion of the distal right PCA. Note is made of a fetal type right PCA. Along with  Atheromatous irregularity with severe tandem nonocclusive the stenoses within the left PCA, Severe nonocclusive left A2 stenosis. Neurology was asked to see due to MRI findings.   Currently she is Afebrile with WBC 18.1--down from 21.2.   Currently patient has not confused and feels back to baseline. Daughter at bedside also concurs that she is at baseline. In discussing her recent stroke, patient has no idea when this might of happened as she does not feel that she had any symptoms.   Date last known well: Unable to determine Time last known well: Unable to determine tPA Given: No: no LSN   SUBJECTIVE (INTERVAL HISTORY) Her family is not  at the bedside.  Overall she feels her condition is completely resolved.  TEE scheduled for later this morning.   OBJECTIVE Temp:  [98.4 F (36.9 C)-98.7 F (37.1 C)] 98.4 F (36.9 C) (09/19 1114) Pulse Rate:  [65-75] 72 (09/19 1220) Cardiac Rhythm: Normal sinus rhythm (09/19 0700) Resp:  [11-18] 16 (09/19 1220) BP: (142-237)/(55-92) 162/78 (09/19 1220) SpO2:  [97 %-100 %] 100 % (09/19 1220) Weight:  [107 lb 4.8 oz (48.7 kg)] 107 lb 4.8 oz (48.7 kg) (09/19 0500)  CBC:  Recent Labs Lab 06/16/16 0437  06/19/16 0316 06/20/16 0246  WBC 18.1*  < > 8.3 9.4  NEUTROABS 16.4*  --   --   --   HGB 12.5  < > 12.0 12.9  HCT 37.5  < > 37.3 40.2  MCV 92.6  < > 94.7 94.8  PLT 265  < > 234 245  < > = values in this interval not displayed.  Basic Metabolic Panel:  Recent Labs Lab 06/15/16 0747  06/19/16 0316 06/20/16 0246  NA 141  < > 140 139  K 3.4*  < > 4.1 4.0  CL  117*  < > 105 102  CO2 17*  < > 28 28  GLUCOSE 154*  < > 102* 101*  BUN 28*  < > 22* 19  CREATININE 1.13*  < > 0.94 0.96  CALCIUM 8.8*  < > 9.6 10.0  MG 1.7  --   --   --   < > = values in this interval not displayed.  Lipid Panel:     Component Value Date/Time   CHOL 200 06/17/2016 0340   TRIG 203 (H) 06/17/2016 0340   HDL 39 (L) 06/17/2016 0340   CHOLHDL 5.1 06/17/2016 0340   VLDL 41 (H) 06/17/2016 0340   LDLCALC 120 (H) 06/17/2016 0340   HgbA1c:  Lab Results  Component Value Date   HGBA1C 5.1 06/17/2016   Urine Drug Screen: No results found for: LABOPIA, COCAINSCRNUR, LABBENZ, AMPHETMU, THCU, LABBARB    IMAGING I have personally reviewed the radiological images below and agree with the radiology interpretations.  Ct Head Wo Contrast 06/15/2016 1. No definite acute intracranial abnormalities.  2. There is a new densely calcified plaque in the right vertebral artery (as above) compared to prior study from 12/08/2009. Although not assessable on today's noncontrast CT examination, the appearance could suggest a hemodynamically significant stenosis. If there are signs or symptoms  of vertebrobasilar insufficiency, further evaluation with noncontrast brain MRI/MRA could be considered.  3. Mild cerebral atrophy with mild chronic microvascular ischemic changes in the cerebral white matter, as above.   [name] Head Wo Contrast  06/16/2016  MRI HEAD  1. Acute/early subacute ischemic nonhemorrhagic right PCA territory infarcts.  2. Moderate chronic microvascular ischemic disease.   MRA HEAD  1. Occlusion of the distal right PCA. Note is made of a fetal type right PCA.  2. Atheromatous irregularity with severe tandem nonocclusive the stenoses within the left PCA.  3. Severe nonocclusive left A2 stenosis.  4. Additional more mild atheromatous disease involving the anterior posterior circulation as above.   Transthoracic echocardiogram 06/15/2016 Study Conclusions - Left  ventricle: The cavity size was normal. Wall thickness was   normal. Systolic function was normal. The estimated ejection   fraction was in the range of 60% to 65%. Wall motion was normal;   there were no regional wall motion abnormalities. Doppler   parameters are consistent with abnormal left ventricular   relaxation (grade 1 diastolic dysfunction). - Mitral valve: There was mild regurgitation. - Pulmonary arteries: Systolic pressure was mildly increased. PA   peak pressure: 35 mm Hg (S). - Pericardium, extracardiac: There was a left pleural effusion.  CUS No evidence of stenosis noted in bilateral carotid arteries. Vertebral arteries demonstrated antegrade flow  PHYSICAL EXAM HEENT-  Normocephalic, no lesions, without obvious abnormality.  Normal external eye and conjunctiva.  Normal TM's bilaterally.  Normal auditory canals and external ears. Normal external nose, mucus membranes and septum.  Normal pharynx. Cardiovascular- S1, S2 normal, pulses palpable throughout   Lungs- chest clear, no wheezing, rales, normal symmetric air entry Abdomen- normal findings: bowel sounds normal Extremities- no edema Lymph-no adenopathy palpable Musculoskeletal-no joint tenderness, deformity or swelling Skin-warm and dry, no hyperpigmentation, vitiligo, or suspicious lesions  Neurological Examination Mental Status: Alert, oriented, thought content appropriate.  Speech fluent without evidence of aphasia.  Able to follow 3 step commands without difficulty.diminished recall 1/3 only. Cranial Nerves: II: Discs flat bilaterally; Visual fields grossly normal, pupils equal, round, reactive to light and accommodation III,IV, VI: ptosis not present, extra-ocular motions intact bilaterally. Partial left superior quadrantanopsia V,VII: smile symmetric, facial light touch sensation normal bilaterally VIII: hearing normal bilaterally IX,X: uvula rises symmetrically XI: bilateral shoulder shrug XII: midline  tongue extension Motor: Right :  Upper extremity   5/5                                      Left:     Upper extremity   5/5             Lower extremity   5/5                                                  Lower extremity   5/5 Tone and bulk:normal tone throughout; no atrophy noted Sensory: Pinprick and light touch intact throughout, bilaterally Deep Tendon Reflexes: 2+ and symmetric throughout Plantars: Right: downgoing                                Left: downgoing Cerebellar: normal finger-to-nose and normal heel-to-shin test Gait: normal gait  and station   ASSESSMENT/PLAN  Ms. ANASTAISA WOODING is a 80 y.o. female with history of hypertension, chronic kidney disease, thyroid disease, recent upper GI bleed and gram negative rods bacteremia presenting with altered mental status. She did not receive IV t-PA due to unknown time of onset and upper GI bleed.   Stroke:  Right PCA scattered infarcts, embolic secondary to an unknown source but endocarditis needs to be ruled out.  Resultant  No neuro deficit  MRI - Acute/early subacute scattered right PCA territory infarcts.   MRA - Occlusion of the distal right PCA. Left PCA high grade stenosis. Severe nonocclusive left A2 stenosis. Bilateral MCA atherosclerosis. No mycotic aneurysm seen.  Carotid Doppler - no significant extracranial stenosis.  2D Echo - EF 60-65%. No cardiac source of emboli identified.  LDL- 120  HgbA1c  5.1  VTE prophylaxis - SCDs Diet NPO time specified Except for: Sips with Meds  No antithrombotic prior to admission, now on aspirin 81 mg daily given no mycotic aneurysm seen.   Patient counseled to be compliant with her antithrombotic medications  Ongoing aggressive stroke risk factor management  Therapy recommendations: No PT or OT follow-up recommended  Disposition: pending  Bacteremia  Blood culture showed Escherichia coli  On imipenem  ID on board, agree with TEE  Due to right PCA stroke,  endocarditis needs to be ruled out which could potentially change antibiotics course  Recommend TEE on Monday, patient is in agreement  Upper GI bleeding  Hb 6.8 -> 13.1  GI okay with aspirin 81  No more bleeding  Hypertension  Blood pressures running mildly high.  Permissive hypertension (OK if < 220/120) but gradually normalize in 5-7 days  Long-term BP goal normotensive  Hyperlipidemia  Home meds:  Lovaza 1 gm daily.  LDL 120, goal < 70  Now on Zocor 20 mg daily.  Continue statin at discharge  Other Stroke Risk Factors  Advanced age  Family hx stroke (Mother)  Other Active Problems  Leukocytosis - 18.1 -> 11.7  Hypokalemia - 3.3   (supplemented)  Chronic kidney disease - creatinine 1.03  Hospital day # 8 I have personally examined this patient, reviewed notes, independently viewed imaging studies, participated in medical decision making and plan of care.ROS completed by me personally and pertinent positives fully documented  I have made any additions or clarifications directly to the above note.  Await TEE Delia Heady, MD Medical Director [facility] Pager: [phone] 06/21/2016 10:34 AM      To contact Stroke Continuity provider, please refer to [email or web address]. After hours, contact General Neurology

## 2016-06-21 NOTE — CV Procedure (Signed)
See full TEE report in [employer] Pt sedated with versed 5 mg and fentanyl 50 micrograms IV. Normal LV function Small oscillating density on aortic valve; possible vegetation; trace AI Full report to follow Olga MillersBrian Boyd [place]

## 2016-06-21 NOTE — Progress Notes (Signed)
PROGRESS NOTE    [name]  ZOX:[id number] DOB: 1936-08-04 DOA: 06/13/2016 PCP: Pearson GrippeJames Kim, MD    Brief Narrative: HISTORY OF PRESENT ILLNESS:   80 y.o. female admitted 9/11 with presumed UGIB as well as shock w/ GNR (ECOLI) bacteremia. EGD showed findings c/w NSAID (9/12) related gastritis. I suspect that the bacteremia was d/t bacterial translocation from gut vs UT source. Will dc vanc, cont imipenem until sensitivities are available. She does have some delirium s/p EGD from meds but this is rapidly clearing. For today: adv diet, cont PPI, dc vanc dc NSAIDs, & cont primaxin. Will give lasix x 1 for what is likely ALI and follow CXR, Will order ECHO to assess for wall motion abnormality although suspect that trop bump is simply r/t sepsis. She will move to tele this afternoon if stays stable.   Assessment & Plan:   Principal Problem:   Sepsis (HCC) Active Problems:   Acute renal failure superimposed on stage 3 chronic kidney disease (HCC)   Essential hypertension   CAP (community acquired pneumonia)   Nausea vomiting and diarrhea   Neutropenia (HCC)   Anxiety   UGIB (upper gastrointestinal bleed)   Hypokalemia   Leukopenia   AKI (acute kidney injury) (HCC)   Dehydration   Encounter for central line placement   Difficult intravenous access   E coli bacteremia   Iron deficiency anemia due to chronic blood loss   Embolic stroke involving right posterior cerebral artery (HCC)   Confusion  DISCUSSION: 80 y.o. female admitted 9/11 with presumed UGIB as well as shock w/ GNR (ECOLI) bacteremia. EGD showed findings c/w NSAID (9/12) related gastritis. I suspect that the bacteremia was d/t bacterial translocation from gut vs UTI source.   She develops confusion s/p EGD thought initially secondary to medications for endoscopy. During work up for AMS, and confusion CT head showed calcified plaque in the vertebral artery. MRI was done which showed Acute stroke and occlusion of right PCA.  Because of presentation with sepsis and Bacteremia and site of stroke neurology recommend TEE. Patient underwent TEE 9-19 which showed an oscillating density  in the aortic valve consistent with possible  vegetation. Discussed results with Dr Ninetta LightsHatcher, will place Pic line and patient will need 6 weeks of IV antibiotics.   Hemorrhagic and septic shock-->resolved.  Hx HTN.  Mild troponin bump in setting of sepsis, trending down, ECHO with Normal EF Goal MAP > 65.  Possible aortic Valve vegetation, Endocarditis, E coli.  bacteremia -->ecoli  Continue with ceftriaxone, Repeated Blood cultures; 9-16 no growth to date.  TEE with possible vegetation on aortic valve. Discussed with ID, patient will need 6 weeks of IV antibiotics.   PNA, pleural effusion;  Repeat chest x ray 9-15. Improved pulmonary edema.  WBC trending down, pleural effusion decreasing.  On IV  ceftriaxone.    Acute stroke;  MRI with acute-subacute stroke, occlusion of Right PCA.  Neurology consulted, and recommending TEE. Discussed with Dr Elnoria HowardHung, ok to use baby aspirin.  Discussed results of TEE with neurology, will discontinue aspirin to avoid mycotic aneurysmal.  On statins.   HTN Continue with  norvasc to 5 mg daily   Acute blood loss anemia - from UGIB (NSAID related gastritis) Endoscopy: Normal esophagus.- Non-bleeding gastric ulcers with no stigmata of bleeding. Biopsied. - Multiple non-bleeding duodenal ulcers with no stigmata of bleeding. Cont daily cbc SCD's only. Dc asa NO NSAIDS F/u post-surgical path continue with protonix.    AKI-->improved Left renal cyst-->no hydro.  NAG metabolic acidosis in setting of hyperchloremia from volume resuscitation  Improved.   Acute hypoxic respiratory failure. CAP vs ALI --> ALI in setting of bacteremia  Continue supplemental O2 as needed to maintain SpO2 > 92% received IV lasix 9-13 9-14 Improved.   Hx hypothyroidism. Adrenal insuff tsh ok; not on rx  stress dose  steroids discontinue  9-14.   Constipation; Miralax ordered. Had BM . Resolved.    DVT prophylaxis: scd Code Status: full code.  Family Communication: family updated.  Disposition Plan: home probably tomorrow after placement of Pic line. Will need also to arrange [facility] nurse for IV antibiotics.    SIGNIFICANT EVENTS: 9/11 > admitted with presumed UGIB and shock (combo of sepsis of unclear etiology as well as hypovolemic) > initially planned for emergent EGD > aborted due to hypotension > transferred to ICU. 9/12 BC + Ecoli. Hemodynamically stable. EGD completed: NSAID related gastritis. ABX narrowed.   Consultants:   Ccm    Procedures:  ECHO; Left ventricle: The cavity size was normal. Wall thickness was   normal. Systolic function was normal. The estimated ejection   fraction was in the range of 60% to 65%. Wall motion was normal;   there were no regional wall motion abnormalities. Doppler   parameters are consistent with abnormal left ventricular   relaxation (grade 1 diastolic dysfunction). - Mitral valve: There was mild regurgitation. - Pulmonary arteries: Systolic pressure was mildly increased. PA   peak pressure: 35 mm Hg (S). - Pericardium, extracardiac: There was a left pleural effusion.    Antimicrobials: Primaxin    Subjective: Just came from TEE, she is feeling well, no confusion.     Objective: Vitals:   06/21/16 1215 06/21/16 1220 06/21/16 1235 06/21/16 1257  BP: (!) 181/83 (!) 162/78 (!) 155/58 (!) 139/51  Pulse: 73 72 74 70  Resp: 15 16 14 15   Temp:    98.1 F (36.7 C)  TempSrc:    Oral  SpO2: 99% 100% 100% 97%  Weight:      Height:        Intake/Output Summary (Last 24 hours) at 06/21/16 1336 Last data filed at 06/21/16 0900  Gross per 24 hour  Intake              290 ml  Output             1050 ml  Net             -760 ml   Filed Weights   06/19/16 0512 06/20/16 0455 06/21/16 0500  Weight: 49.3 kg (108 lb 11.2 oz) 48.2 kg (106 lb 4.8  oz) 48.7 kg (107 lb 4.8 oz)    Examination:  General exam: Appears calm and comfortable  Respiratory system: Clear to auscultation. Respiratory effort normal. Cardiovascular system: S1 & S2 heard, RRR. No JVD, murmurs, rubs, gallops or clicks. No pedal edema. Gastrointestinal system: Abdomen is nondistended, soft and nontender. No organomegaly or masses felt. Normal bowel sounds heard. Central nervous system: Alert and oriented. No focal neurological deficits. Extremities: Symmetric 5 x 5 power. Skin: No rashes, lesions or ulcers Psychiatry: Judgement and insight appear normal. Mood & affect appropriate.     Data Reviewed: I have personally reviewed following labs and imaging studies  CBC:  Recent Labs Lab 06/15/16 0747 06/16/16 0437 06/17/16 0340 06/19/16 0316 06/20/16 0246  WBC 19.5* 18.1* 11.7* 8.3 9.4  NEUTROABS  --  16.4*  --   --   --   HGB  11.6* 12.5 13.1 12.0 12.9  HCT 35.0* 37.5 39.7 37.3 40.2  MCV 90.7 92.6 92.1 94.7 94.8  PLT 211 265 246 234 245   Basic Metabolic Panel:  Recent Labs Lab 06/15/16 0747 06/16/16 0437 06/17/16 0340 06/18/16 0305 06/19/16 0316 06/20/16 0246  NA 141 145 143 140 140 139  K 3.4* 3.8 3.3* 4.0 4.1 4.0  CL 117* 111 104 104 105 102  CO2 17* 27 29 30 28 28   GLUCOSE 154* 131* 96 107* 102* 101*  BUN 28* 21* 19 16 22* 19  CREATININE 1.13* 1.09* 1.03* 1.02* 0.94 0.96  CALCIUM 8.8* 9.1 9.0 9.2 9.6 10.0  MG 1.7  --   --   --   --   --    GFR: Estimated Creatinine Clearance: 34.7 mL/min (by C-G formula based on SCr of 0.96 mg/dL). Liver Function Tests:  Recent Labs Lab 06/15/16 0747  AST 49*  ALT 95*  ALKPHOS 90  BILITOT 0.6  PROT 5.0*  ALBUMIN 2.4*   No results for input(s): LIPASE, AMYLASE in the last 168 hours. No results for input(s): AMMONIA in the last 168 hours. Coagulation Profile: No results for input(s): INR, PROTIME in the last 168 hours. Cardiac Enzymes:  Recent Labs Lab 06/15/16 1106 06/15/16 1524  06/15/16 2147  TROPONINI 0.66* 0.53* 0.40*   BNP (last 3 results) No results for input(s): PROBNP in the last 8760 hours. HbA1C: No results for input(s): HGBA1C in the last 72 hours. CBG: No results for input(s): GLUCAP in the last 168 hours. Lipid Profile: No results for input(s): CHOL, HDL, LDLCALC, TRIG, CHOLHDL, LDLDIRECT in the last 72 hours. Thyroid Function Tests: No results for input(s): TSH, T4TOTAL, FREET4, T3FREE, THYROIDAB in the last 72 hours. Anemia Panel: No results for input(s): VITAMINB12, FOLATE, FERRITIN, TIBC, IRON, RETICCTPCT in the last 72 hours. Sepsis Labs:  Recent Labs Lab 06/15/16 0747  PROCALCITON 28.79    Recent Results (from the past 240 hour(s))  Blood Culture (routine x 2)     Status: Abnormal   Collection Time: 06/13/16  1:28 AM  Result Value Ref Range Status   Specimen Description BLOOD LEFT ARM  Final   Special Requests BOTTLES DRAWN AEROBIC AND ANAEROBIC  Final   Culture  Setup Time   Final    GRAM NEGATIVE RODS IN BOTH AEROBIC AND ANAEROBIC BOTTLES CRITICAL RESULT CALLED TO, READ BACK BY AND VERIFIED WITH: T.EGAN, PHARMD AT1720 ON 06/13/16 BY C. JESSUP, MLT.    Culture (A)  Final    ESCHERICHIA COLI SUSCEPTIBILITIES PERFORMED ON PREVIOUS CULTURE WITHIN THE LAST 5 DAYS.    Report Status 06/16/2016 FINAL  Final   Organism ID, Bacteria ESCHERICHIA COLI  Final      Susceptibility   Escherichia coli - MIC*    AMPICILLIN <=2 SENSITIVE Sensitive     CEFAZOLIN <=4 SENSITIVE Sensitive     CEFEPIME <=1 SENSITIVE Sensitive     CEFTAZIDIME <=1 SENSITIVE Sensitive     CEFTRIAXONE <=1 SENSITIVE Sensitive     CIPROFLOXACIN <=0.25 SENSITIVE Sensitive     GENTAMICIN <=1 SENSITIVE Sensitive     IMIPENEM <=0.25 SENSITIVE Sensitive     TRIMETH/SULFA <=20 SENSITIVE Sensitive     AMPICILLIN/SULBACTAM <=2 SENSITIVE Sensitive     PIP/TAZO <=4 SENSITIVE Sensitive     Extended ESBL NEGATIVE Sensitive     * ESCHERICHIA COLI  Blood Culture ID Panel  (Reflexed)     Status: Abnormal   Collection Time: 06/13/16  1:28 AM  Result Value Ref Range Status   Enterococcus species NOT DETECTED NOT DETECTED Final   Listeria monocytogenes NOT DETECTED NOT DETECTED Final   Staphylococcus species NOT DETECTED NOT DETECTED Final   Staphylococcus aureus NOT DETECTED NOT DETECTED Final   Streptococcus species NOT DETECTED NOT DETECTED Final   Streptococcus agalactiae NOT DETECTED NOT DETECTED Final   Streptococcus pneumoniae NOT DETECTED NOT DETECTED Final   Streptococcus pyogenes NOT DETECTED NOT DETECTED Final   Acinetobacter baumannii NOT DETECTED NOT DETECTED Final   Enterobacteriaceae species DETECTED (A) NOT DETECTED Final    Comment: CRITICAL RESULT CALLED TO, READ BACK BY AND VERIFIED WITH: T. EGAN, PHARMD AT 1720 ON 06/13/16 BY C. JESSUP, MLT.    Enterobacter cloacae complex NOT DETECTED NOT DETECTED Final   Escherichia coli DETECTED (A) NOT DETECTED Final    Comment: CRITICAL RESULT CALLED TO, READ BACK BY AND VERIFIED WITH: T. EGAN, PHARMD AT 1720 ON 06/13/16 BY C. JESSUP, MLT.    Klebsiella oxytoca NOT DETECTED NOT DETECTED Final   Klebsiella pneumoniae NOT DETECTED NOT DETECTED Final   Proteus species NOT DETECTED NOT DETECTED Final   Serratia marcescens NOT DETECTED NOT DETECTED Final   Carbapenem resistance NOT DETECTED NOT DETECTED Final   Haemophilus influenzae NOT DETECTED NOT DETECTED Final   Neisseria meningitidis NOT DETECTED NOT DETECTED Final   Pseudomonas aeruginosa NOT DETECTED NOT DETECTED Final   Candida albicans NOT DETECTED NOT DETECTED Final   Candida glabrata NOT DETECTED NOT DETECTED Final   Candida krusei NOT DETECTED NOT DETECTED Final   Candida parapsilosis NOT DETECTED NOT DETECTED Final   Candida tropicalis NOT DETECTED NOT DETECTED Final  Urine culture     Status: Abnormal   Collection Time: 06/13/16  1:55 AM  Result Value Ref Range Status   Specimen Description URINE, RANDOM  Final   Special Requests NONE   Final   Culture >=100,000 COLONIES/mL ESCHERICHIA COLI (A)  Final   Report Status 06/15/2016 FINAL  Final   Organism ID, Bacteria ESCHERICHIA COLI (A)  Final      Susceptibility   Escherichia coli - MIC*    AMPICILLIN <=2 SENSITIVE Sensitive     CEFAZOLIN <=4 SENSITIVE Sensitive     CEFTRIAXONE <=1 SENSITIVE Sensitive     CIPROFLOXACIN <=0.25 SENSITIVE Sensitive     GENTAMICIN <=1 SENSITIVE Sensitive     IMIPENEM <=0.25 SENSITIVE Sensitive     NITROFURANTOIN <=16 SENSITIVE Sensitive     TRIMETH/SULFA <=20 SENSITIVE Sensitive     AMPICILLIN/SULBACTAM <=2 SENSITIVE Sensitive     PIP/TAZO <=4 SENSITIVE Sensitive     Extended ESBL NEGATIVE Sensitive     * >=100,000 COLONIES/mL ESCHERICHIA COLI  Blood Culture (routine x 2)     Status: Abnormal   Collection Time: 06/13/16  2:15 AM  Result Value Ref Range Status   Specimen Description BLOOD LEFT WRIST  Final   Special Requests IN PEDIATRIC BOTTLE  Final   Culture  Setup Time   Final    GRAM NEGATIVE RODS AEROBIC BOTTLE ONLY CRITICAL RESULT CALLED TO, READ BACK BY AND VERIFIED WITH: T. EGAN, PHARMD AT 1720 ON 06/13/16 BY C. JESSUP, MLT.    Culture (A)  Final    ESCHERICHIA COLI SUSCEPTIBILITIES PERFORMED ON PREVIOUS CULTURE WITHIN THE LAST 5 DAYS.    Report Status 06/15/2016 FINAL  Final  Respiratory Panel by PCR     Status: None   Collection Time: 06/13/16  9:19 AM  Result Value Ref Range Status  Adenovirus NOT DETECTED NOT DETECTED Final   Coronavirus 229E NOT DETECTED NOT DETECTED Final   Coronavirus HKU1 NOT DETECTED NOT DETECTED Final   Coronavirus NL63 NOT DETECTED NOT DETECTED Final   Coronavirus OC43 NOT DETECTED NOT DETECTED Final   Metapneumovirus NOT DETECTED NOT DETECTED Final   Rhinovirus / Enterovirus NOT DETECTED NOT DETECTED Final   Influenza A NOT DETECTED NOT DETECTED Final   Influenza B NOT DETECTED NOT DETECTED Final   Parainfluenza Virus 1 NOT DETECTED NOT DETECTED Final   Parainfluenza Virus 2 NOT  DETECTED NOT DETECTED Final   Parainfluenza Virus 3 NOT DETECTED NOT DETECTED Final   Parainfluenza Virus 4 NOT DETECTED NOT DETECTED Final   Respiratory Syncytial Virus NOT DETECTED NOT DETECTED Final   Bordetella pertussis NOT DETECTED NOT DETECTED Final   Chlamydophila pneumoniae NOT DETECTED NOT DETECTED Final   Mycoplasma pneumoniae NOT DETECTED NOT DETECTED Final  Culture, blood (routine x 2)     Status: None (Preliminary result)   Collection Time: 06/17/16  6:07 PM  Result Value Ref Range Status   Specimen Description BLOOD LEFT ANTECUBITAL  Final   Special Requests BOTTLES DRAWN AEROBIC AND ANAEROBIC 5CC  Final   Culture NO GROWTH 4 DAYS  Final   Report Status PENDING  Incomplete  Culture, blood (routine x 2)     Status: None (Preliminary result)   Collection Time: 06/17/16  6:18 PM  Result Value Ref Range Status   Specimen Description BLOOD LEFT HAND  Final   Special Requests BOTTLES DRAWN AEROBIC AND ANAEROBIC 5CC  Final   Culture NO GROWTH 4 DAYS  Final   Report Status PENDING  Incomplete         Radiology Studies: No results found.      Scheduled Meds: . amLODipine  5 mg Oral Daily  . cefTRIAXone (ROCEPHIN)  IV  2 g Intravenous Q12H  . feeding supplement (ENSURE ENLIVE)  237 mL Oral BID BM  . metoprolol tartrate  25 mg Oral BID  . omega-3 acid ethyl esters  1 g Oral Daily  . pantoprazole  40 mg Oral BID  . polyethylene glycol  17 g Oral BID  . senna-docusate  1 tablet Oral BID  . simvastatin  20 mg Oral q1800  . sodium chloride  2,000 mL Intravenous Once   Continuous Infusions:     LOS: 8 days    Time spent:35 minutes.     Alba Cory, MD [facility] Pager [phone]  If 7PM-7AM, please contact night-coverage [email or web address] Password [email or web address] 06/21/2016, 1:36 PM

## 2016-06-21 NOTE — Progress Notes (Signed)
 INFECTIOUS DISEASE PROGRESS NOTE  ID: [name] is a 80 y.o. female with  Principal Problem:   Sepsis (HCC) Active Problems:   Acute renal failure superimposed on stage 3 chronic kidney disease (HCC)   Essential hypertension   CAP (community acquired pneumonia)   Nausea vomiting and diarrhea   Neutropenia (HCC)   Anxiety   UGIB (upper gastrointestinal bleed)   Hypokalemia   Leukopenia   AKI (acute kidney injury) (HCC)   Dehydration   Encounter for central line placement   Difficult intravenous access   E coli bacteremia   Iron deficiency anemia due to chronic blood loss   Embolic stroke involving right posterior cerebral artery (HCC)   Confusion  Subjective: Without complaints.   Abtx:  Anti-infectives    Start     Dose/Rate Route Frequency Ordered Stop   06/17/16 1230  cefTRIAXone (ROCEPHIN) 2 g in dextrose 5 % 50 mL IVPB     2 g 100 mL/hr over 30 Minutes Intravenous Every 12 hours 06/17/16 1216     06/17/16 1215  ceFAZolin (ANCEF) IVPB 1 g/50 mL premix  Status:  Discontinued     1 g 100 mL/hr over 30 Minutes Intravenous Every 12 hours 06/17/16 1213 06/17/16 1216   06/17/16 1200  cefTRIAXone (ROCEPHIN) 2 g in dextrose 5 % 50 mL IVPB  Status:  Discontinued     2 g 100 mL/hr over 30 Minutes Intravenous Every 24 hours 06/17/16 1140 06/17/16 1213   06/17/16 1000  sulfamethoxazole-trimethoprim (BACTRIM DS,SEPTRA DS) 800-160 MG per tablet 1 tablet  Status:  Discontinued     1 tablet Oral Every 12 hours 06/17/16 0900 06/17/16 1140   06/16/16 1000  imipenem-cilastatin (PRIMAXIN) 500 mg in sodium chloride 0.9 % 100 mL IVPB  Status:  Discontinued     500 mg 200 mL/hr over 30 Minutes Intravenous Every 8 hours 06/16/16 0931 06/17/16 0859   06/15/16 0200  levofloxacin (LEVAQUIN) IVPB 500 mg  Status:  Discontinued     500 mg 100 mL/hr over 60 Minutes Intravenous Every 48 hours 06/13/16 0558 06/13/16 1343   06/14/16 0300  vancomycin (VANCOCIN) 500 mg in sodium chloride 0.9 %  100 mL IVPB  Status:  Discontinued     500 mg 100 mL/hr over 60 Minutes Intravenous Every 24 hours 06/13/16 1344 06/14/16 1007   06/13/16 1400  imipenem-cilastatin (PRIMAXIN) 500 mg in sodium chloride 0.9 % 100 mL IVPB  Status:  Discontinued     500 mg 200 mL/hr over 30 Minutes Intravenous Every 12 hours 06/13/16 1344 06/16/16 0931   06/13/16 0145  levofloxacin (LEVAQUIN) IVPB 750 mg     750 mg 100 mL/hr over 90 Minutes Intravenous  Once 06/13/16 0143 06/13/16 0334   06/13/16 0145  aztreonam (AZACTAM) 2 g in dextrose 5 % 50 mL IVPB     2 g 100 mL/hr over 30 Minutes Intravenous  Once 06/13/16 0143 06/13/16 0242   06/13/16 0145  vancomycin (VANCOCIN) IVPB 1000 mg/200 mL premix     1,000 mg 200 mL/hr over 60 Minutes Intravenous  Once 06/13/16 0143 06/13/16 0335      Medications:  Scheduled: . amLODipine  5 mg Oral Daily  . aspirin EC  81 mg Oral Daily  . cefTRIAXone (ROCEPHIN)  IV  2 g Intravenous Q12H  . feeding supplement (ENSURE ENLIVE)  237 mL Oral BID BM  . metoprolol tartrate  25 mg Oral BID  . omega-3 acid ethyl esters  1 g Oral Daily  .   pantoprazole  40 mg Oral BID  . polyethylene glycol  17 g Oral BID  . senna-docusate  1 tablet Oral BID  . simvastatin  20 mg Oral q1800  . sodium chloride  2,000 mL Intravenous Once    Objective: Vital signs in last 24 hours: Temp:  [98.5 F (36.9 C)-98.7 F (37.1 C)] 98.6 F (37 C) (09/19 1013) Pulse Rate:  [70-74] 70 (09/19 1013) Resp:  [12-18] 12 (09/19 1013) BP: (142-175)/(55-77) 150/63 (09/19 1013) SpO2:  [97 %-98 %] 98 % (09/19 1013) Weight:  [48.7 kg (107 lb 4.8 oz)] 48.7 kg (107 lb 4.8 oz) (09/19 0500)   General appearance: alert, cooperative and no distress Resp: clear to auscultation bilaterally Cardio: regular rate and rhythm GI: normal findings: bowel sounds normal and soft, non-tender  Lab Results  Recent Labs  06/19/16 0316 06/20/16 0246  WBC 8.3 9.4  HGB 12.0 12.9  HCT 37.3 40.2  NA 140 139  K 4.1 4.0    CL 105 102  CO2 28 28  BUN 22* 19  CREATININE 0.94 0.96   Liver Panel No results for input(s): PROT, ALBUMIN, AST, ALT, ALKPHOS, BILITOT, BILIDIR, IBILI in the last 72 hours. Sedimentation Rate No results for input(s): ESRSEDRATE in the last 72 hours. C-Reactive Protein No results for input(s): CRP in the last 72 hours.  Microbiology: Recent Results (from the past 240 hour(s))  Blood Culture (routine x 2)     Status: Abnormal   Collection Time: 06/13/16  1:28 AM  Result Value Ref Range Status   Specimen Description BLOOD LEFT ARM  Final   Special Requests BOTTLES DRAWN AEROBIC AND ANAEROBIC 5ML  Final   Culture  Setup Time   Final    GRAM NEGATIVE RODS IN BOTH AEROBIC AND ANAEROBIC BOTTLES CRITICAL RESULT CALLED TO, READ BACK BY AND VERIFIED WITH: T.EGAN, PHARMD AT1720 ON 06/13/16 BY C. JESSUP, MLT.    Culture (A)  Final    ESCHERICHIA COLI SUSCEPTIBILITIES PERFORMED ON PREVIOUS CULTURE WITHIN THE LAST 5 DAYS.    Report Status 06/16/2016 FINAL  Final   Organism ID, Bacteria ESCHERICHIA COLI  Final      Susceptibility   Escherichia coli - MIC*    AMPICILLIN <=2 SENSITIVE Sensitive     CEFAZOLIN <=4 SENSITIVE Sensitive     CEFEPIME <=1 SENSITIVE Sensitive     CEFTAZIDIME <=1 SENSITIVE Sensitive     CEFTRIAXONE <=1 SENSITIVE Sensitive     CIPROFLOXACIN <=0.25 SENSITIVE Sensitive     GENTAMICIN <=1 SENSITIVE Sensitive     IMIPENEM <=0.25 SENSITIVE Sensitive     TRIMETH/SULFA <=20 SENSITIVE Sensitive     AMPICILLIN/SULBACTAM <=2 SENSITIVE Sensitive     PIP/TAZO <=4 SENSITIVE Sensitive     Extended ESBL NEGATIVE Sensitive     * ESCHERICHIA COLI  Blood Culture ID Panel (Reflexed)     Status: Abnormal   Collection Time: 06/13/16  1:28 AM  Result Value Ref Range Status   Enterococcus species NOT DETECTED NOT DETECTED Final   Listeria monocytogenes NOT DETECTED NOT DETECTED Final   Staphylococcus species NOT DETECTED NOT DETECTED Final   Staphylococcus aureus NOT DETECTED  NOT DETECTED Final   Streptococcus species NOT DETECTED NOT DETECTED Final   Streptococcus agalactiae NOT DETECTED NOT DETECTED Final   Streptococcus pneumoniae NOT DETECTED NOT DETECTED Final   Streptococcus pyogenes NOT DETECTED NOT DETECTED Final   Acinetobacter baumannii NOT DETECTED NOT DETECTED Final   Enterobacteriaceae species DETECTED (A) NOT DETECTED Final      Comment: CRITICAL RESULT CALLED TO, READ BACK BY AND VERIFIED WITH: T. EGAN, PHARMD AT 1720 ON 06/13/16 BY C. JESSUP, MLT.    Enterobacter cloacae complex NOT DETECTED NOT DETECTED Final   Escherichia coli DETECTED (A) NOT DETECTED Final    Comment: CRITICAL RESULT CALLED TO, READ BACK BY AND VERIFIED WITH: T. EGAN, PHARMD AT 1720 ON 06/13/16 BY C. JESSUP, MLT.    Klebsiella oxytoca NOT DETECTED NOT DETECTED Final   Klebsiella pneumoniae NOT DETECTED NOT DETECTED Final   Proteus species NOT DETECTED NOT DETECTED Final   Serratia marcescens NOT DETECTED NOT DETECTED Final   Carbapenem resistance NOT DETECTED NOT DETECTED Final   Haemophilus influenzae NOT DETECTED NOT DETECTED Final   Neisseria meningitidis NOT DETECTED NOT DETECTED Final   Pseudomonas aeruginosa NOT DETECTED NOT DETECTED Final   Candida albicans NOT DETECTED NOT DETECTED Final   Candida glabrata NOT DETECTED NOT DETECTED Final   Candida krusei NOT DETECTED NOT DETECTED Final   Candida parapsilosis NOT DETECTED NOT DETECTED Final   Candida tropicalis NOT DETECTED NOT DETECTED Final  Urine culture     Status: Abnormal   Collection Time: 06/13/16  1:55 AM  Result Value Ref Range Status   Specimen Description URINE, RANDOM  Final   Special Requests NONE  Final   Culture >=100,000 COLONIES/mL ESCHERICHIA COLI (A)  Final   Report Status 06/15/2016 FINAL  Final   Organism ID, Bacteria ESCHERICHIA COLI (A)  Final      Susceptibility   Escherichia coli - MIC*    AMPICILLIN <=2 SENSITIVE Sensitive     CEFAZOLIN <=4 SENSITIVE Sensitive     CEFTRIAXONE <=1  SENSITIVE Sensitive     CIPROFLOXACIN <=0.25 SENSITIVE Sensitive     GENTAMICIN <=1 SENSITIVE Sensitive     IMIPENEM <=0.25 SENSITIVE Sensitive     NITROFURANTOIN <=16 SENSITIVE Sensitive     TRIMETH/SULFA <=20 SENSITIVE Sensitive     AMPICILLIN/SULBACTAM <=2 SENSITIVE Sensitive     PIP/TAZO <=4 SENSITIVE Sensitive     Extended ESBL NEGATIVE Sensitive     * >=100,000 COLONIES/mL ESCHERICHIA COLI  Blood Culture (routine x 2)     Status: Abnormal   Collection Time: 06/13/16  2:15 AM  Result Value Ref Range Status   Specimen Description BLOOD LEFT WRIST  Final   Special Requests IN PEDIATRIC BOTTLE 4ML  Final   Culture  Setup Time   Final    GRAM NEGATIVE RODS AEROBIC BOTTLE ONLY CRITICAL RESULT CALLED TO, READ BACK BY AND VERIFIED WITH: T. EGAN, PHARMD AT 1720 ON 06/13/16 BY C. JESSUP, MLT.    Culture (A)  Final    ESCHERICHIA COLI SUSCEPTIBILITIES PERFORMED ON PREVIOUS CULTURE WITHIN THE LAST 5 DAYS.    Report Status 06/15/2016 FINAL  Final  Respiratory Panel by PCR     Status: None   Collection Time: 06/13/16  9:19 AM  Result Value Ref Range Status   Adenovirus NOT DETECTED NOT DETECTED Final   Coronavirus 229E NOT DETECTED NOT DETECTED Final   Coronavirus HKU1 NOT DETECTED NOT DETECTED Final   Coronavirus NL63 NOT DETECTED NOT DETECTED Final   Coronavirus OC43 NOT DETECTED NOT DETECTED Final   Metapneumovirus NOT DETECTED NOT DETECTED Final   Rhinovirus / Enterovirus NOT DETECTED NOT DETECTED Final   Influenza A NOT DETECTED NOT DETECTED Final   Influenza B NOT DETECTED NOT DETECTED Final   Parainfluenza Virus 1 NOT DETECTED NOT DETECTED Final   Parainfluenza Virus 2 NOT DETECTED NOT DETECTED Final     Parainfluenza Virus 3 NOT DETECTED NOT DETECTED Final   Parainfluenza Virus 4 NOT DETECTED NOT DETECTED Final   Respiratory Syncytial Virus NOT DETECTED NOT DETECTED Final   Bordetella pertussis NOT DETECTED NOT DETECTED Final   Chlamydophila pneumoniae NOT DETECTED NOT  DETECTED Final   Mycoplasma pneumoniae NOT DETECTED NOT DETECTED Final  Culture, blood (routine x 2)     Status: None (Preliminary result)   Collection Time: 06/17/16  6:07 PM  Result Value Ref Range Status   Specimen Description BLOOD LEFT ANTECUBITAL  Final   Special Requests BOTTLES DRAWN AEROBIC AND ANAEROBIC 5CC  Final   Culture NO GROWTH 4 DAYS  Final   Report Status PENDING  Incomplete  Culture, blood (routine x 2)     Status: None (Preliminary result)   Collection Time: 06/17/16  6:18 PM  Result Value Ref Range Status   Specimen Description BLOOD LEFT HAND  Final   Special Requests BOTTLES DRAWN AEROBIC AND ANAEROBIC 5CC  Final   Culture NO GROWTH 4 DAYS  Final   Report Status PENDING  Incomplete    Studies/Results: No results found.   Assessment/Plan: Bacteremia UTI + Troponins  NSAID association gastritis, duodenal and gastric ulcers CVA (thrombosis vs embolus)  Repeat BCx ngtd Await TEE (today) to determine duration of anbx  Total days of antibiotics: 8 ceftriaxone         [facility] (pager) [phone] [email or web address] 06/21/2016, 10:44 AM  LOS: 8 days     

## 2016-06-21 NOTE — Interval H&P Note (Signed)
History and Physical Interval Note:  06/21/2016 11:51 AM  [name]  has presented today for surgery, with the diagnosis of BACTEREMIA  The various methods of treatment have been discussed with the patient and family. After consideration of risks, benefits and other options for treatment, the patient has consented to  Procedure(s): TRANSESOPHAGEAL ECHOCARDIOGRAM (TEE) (N/A) as a surgical intervention .  The patient's history has been reviewed, patient examined, no change in status, stable for surgery.  I have reviewed the patient's chart and labs.  Questions were answered to the patient's satisfaction.     Olga MillersBrian Farhiya Rosten

## 2016-06-22 ENCOUNTER — Encounter (HOSPITAL_COMMUNITY): Payer: Self-pay | Admitting: Cardiology

## 2016-06-22 LAB — CULTURE, BLOOD (ROUTINE X 2)
CULTURE: NO GROWTH
Culture: NO GROWTH

## 2016-07-14 ENCOUNTER — Encounter: Payer: Self-pay | Admitting: Infectious Diseases

## 2016-07-26 ENCOUNTER — Encounter: Payer: Self-pay | Admitting: Infectious Diseases

## 2016-07-28 ENCOUNTER — Encounter: Payer: Self-pay | Admitting: Infectious Diseases

## 2016-08-01 ENCOUNTER — Encounter: Payer: Self-pay | Admitting: Infectious Diseases

## 2016-08-01 ENCOUNTER — Ambulatory Visit (INDEPENDENT_AMBULATORY_CARE_PROVIDER_SITE_OTHER): Payer: BC Managed Care – PPO | Admitting: Infectious Diseases

## 2016-08-01 VITALS — BP 174/74 | HR 62 | Temp 97.6°F | Ht 59.5 in | Wt 107.0 lb

## 2016-08-01 DIAGNOSIS — I63431 Cerebral infarction due to embolism of right posterior cerebral artery: Secondary | ICD-10-CM | POA: Diagnosis not present

## 2016-08-01 DIAGNOSIS — R7881 Bacteremia: Secondary | ICD-10-CM

## 2016-08-01 NOTE — Progress Notes (Signed)
   Subjective:    Patient ID: [name], female    DOB: [birth year], 80 y.o.   MRN: [id number]  HPI 80 yo F with hx of CKD III, adm on 9-11 with n/v for 48h (1-2x/day) and 3-4 loose BM/day.  There some question of hematemesis at home (she was taking NSAIDS, goody's). She was found to have a Hgb drop of 5.4. She was also noted to have a temp of 101.3.  She was started on vanco/aztreonam/levaquin in ED, levaquin continued after admission. She was then changed to imipenem/vanco. Her BCx and UCx grew E coli (pan-sens).  She underwent EGD She was noted to have leukocytosis which was attributed to steroids she received in the hospital.  On hospital, she had mental status change, this was initially attributed to her sedation meds from her EGD. However her MRI showed a R pca acute-subacute CVA.   She had TEE showing possible Ao valve lesion. Her BCx and her UCx grew E coli. She was d/c home on ceftriaxone 2g q12h and levaquin 250mg  qday for total of 6 weeks (completed on 10-23).  Today she states she completes her anbx in 1 more day. She states she has had nausea, diarrhea (loose BM 2x/day), dry skin.  Has had chills a couple of times.  She has had no further episodes of confusion.  No problems with PIC. Mild redness (from tape), no tenderness, no d/c.   Review of Systems  Constitutional: Positive for chills. Negative for appetite change, fever and unexpected weight change.  Gastrointestinal: Positive for diarrhea. Negative for constipation.  Genitourinary: Negative for difficulty urinating.  Psychiatric/Behavioral: Negative for confusion.       Objective:   Physical Exam  Constitutional: She appears well-developed and well-nourished.  HENT:  Mouth/Throat: No oropharyngeal exudate.  Eyes: EOM are normal. Pupils are equal, round, and reactive to light.  Neck: Neck supple.  Cardiovascular: Normal rate, regular rhythm and normal heart sounds.   Pulmonary/Chest: Effort normal and breath sounds  normal.  Abdominal: Soft. Bowel sounds are normal. There is no tenderness. There is no rebound.  Musculoskeletal: She exhibits no edema.       Arms: Lymphadenopathy:    She has no cervical adenopathy.      Assessment & Plan:

## 2016-08-01 NOTE — Assessment & Plan Note (Signed)
She is doing well.  Explained pathology of IE to her and husband.  Will stop her anbx today and pull her pic as soon as we can.  Will have her back in 1 week for repeat BCx.  Will call her with results as needed.

## 2016-08-01 NOTE — Assessment & Plan Note (Signed)
She has not f/u with neuro and has questions as to whether she needs plavix.  I encouraged her to f/u with neuro regarding this.

## 2016-08-04 ENCOUNTER — Encounter: Payer: Self-pay | Admitting: Neurology

## 2016-08-04 ENCOUNTER — Ambulatory Visit (INDEPENDENT_AMBULATORY_CARE_PROVIDER_SITE_OTHER): Payer: BC Managed Care – PPO | Admitting: Neurology

## 2016-08-04 VITALS — BP 135/76 | HR 59 | Ht 59.5 in | Wt 107.6 lb

## 2016-08-04 DIAGNOSIS — I63431 Cerebral infarction due to embolism of right posterior cerebral artery: Secondary | ICD-10-CM | POA: Diagnosis not present

## 2016-08-04 DIAGNOSIS — K922 Gastrointestinal hemorrhage, unspecified: Secondary | ICD-10-CM

## 2016-08-04 DIAGNOSIS — I10 Essential (primary) hypertension: Secondary | ICD-10-CM | POA: Diagnosis not present

## 2016-08-04 DIAGNOSIS — I330 Acute and subacute infective endocarditis: Secondary | ICD-10-CM | POA: Diagnosis not present

## 2016-08-04 DIAGNOSIS — E785 Hyperlipidemia, unspecified: Secondary | ICD-10-CM | POA: Diagnosis not present

## 2016-08-04 MED ORDER — CLOPIDOGREL BISULFATE 75 MG PO TABS: 75.0000 mg | ORAL_TABLET | Freq: Every day | ORAL | 3 refills | Status: DC

## 2016-08-04 MED ORDER — SIMVASTATIN 20 MG PO TABS: 20.0000 mg | ORAL_TABLET | Freq: Every day | ORAL | 3 refills | Status: DC

## 2016-08-04 NOTE — Progress Notes (Signed)
STROKE NEUROLOGY FOLLOW UP NOTE  NAME: [name] DOB: 12/19/1935  REASON FOR VISIT: stroke follow up HISTORY FROM: husband and pt and chart  Today we had the pleasure of seeing [name] in follow-up at our Neurology Clinic. Pt was accompanied by husband.   History Summary Ms. [name] is a 80 y.o. female with history of hypertension, chronic kidney disease, thyroid disease, recent upper GI bleed and gram negative rods bacteremia admitted on 06/13/2016 for AMS.  MRI showed acute right PCA territory scattered infarcts. MRA showed distal right PCA occlusion, left PCA and left A2 high-grade stenosis, and no mycotic aneurysms. Carotid Doppler, TTE unremarkable. LDL 128 and A1c 5.1. Blood culture showed positive bacteremia with Escherichia coli. She was put on antibiotics. Repeat culture negative. 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 She was put on PICC line and started every antibiotics for 6 weeks. Had UGIB and anemia, received blood transfusion. EGD showed non-bleeding gastric and duodenal ulcers with no stigmata of bleeding. She was started on aspirin 81 and Zocor 20 for stroke prevention. Her neuro symptoms resolved and she was discharged home. However, on discharge her aspirin was discontinued.   Interval History During the interval time, the patient has been doing well. No stroke like symptoms. She just finished 6 weeks IV antibiotics, PICC line removed yesterday. She followed with ID Dr. Ninetta Lights, will repeat blood culture. She stopped Zocor by herself due to no more refills. She is not taking NSAIDs anymore. On Norvasc and metoprolol for BP control, BP today 135/76. Otherwise, no other complaints  REVIEW OF SYSTEMS: Full 14 system review of systems performed and notable only for those listed below and in HPI above, all others are negative:  Constitutional:   Cardiovascular:    Ear/Nose/Throat:   Skin:  Eyes:   Respiratory:   Gastroitestinal:   Genitourinary:  Hematology/Lymphatic:   Endocrine:  Musculoskeletal:   Allergy/Immunology:   Neurological:   Psychiatric:  Sleep:   The following represents the patient's updated allergies and side effects list: Allergies  Allergen Reactions  . Amoxicillin Hives and Itching    Has patient had a PCN reaction causing immediate rash, facial/tongue/throat swelling, SOB or lightheadedness with hypotension: Yes Has patient had a PCN reaction causing severe rash involving mucus membranes or skin necrosis: Yes Has patient had a PCN reaction that required hospitalization No Has patient had a PCN reaction occurring within the last 10 years: Yes If all of the above answers are "NO", then may proceed with Cephalosporin use.     The neurologically relevant items on the patient's problem list were reviewed on today's visit.  Neurologic Examination  A problem focused neurological exam (12 or more points of the single system neurologic examination, vital signs counts as 1 point, cranial nerves count for 8 points) was performed.  Blood pressure 135/76, pulse (!) 59, height 4' 11.5" (1.511 m), weight 107 lb 9.6 oz (48.8 kg).  General - Well nourished, well developed, in no apparent distress.  Ophthalmologic - Sharp disc margins OU.   Cardiovascular - Regular rate and rhythm with no murmur.  Mental Status -  Level of arousal and orientation to time, place, and person were intact. Language including expression, naming, repetition, comprehension was assessed and found intact. Fund of Knowledge was assessed and was intact.  Cranial Nerves II - XII - II - Visual field intact OU. III, IV, VI - Extraocular  movements intact. V - Facial sensation intact bilaterally. VII - Facial movement intact bilaterally. VIII - Hearing & vestibular intact bilaterally. X - Palate elevates symmetrically. XI - Chin turning & shoulder shrug  intact bilaterally. XII - Tongue protrusion intact.  Motor Strength - The patient's strength was normal in all extremities and pronator drift was absent.  Bulk was normal and fasciculations were absent.   Motor Tone - Muscle tone was assessed at the neck and appendages and was normal.  Reflexes - The patient's reflexes were 1+ in all extremities and she had no pathological reflexes.  Sensory - Light touch, temperature/pinprick were assessed and were normal.    Coordination - The patient had normal movements in the hands and feet with no ataxia or dysmetria.  Tremor was absent.  Gait and Station - The patient's transfers, posture, gait, station, and turns were observed as normal.   Functional score  mRS = 0   0 - No symptoms.   1 - No significant disability. Able to carry out all usual activities, despite some symptoms.   2 - Slight disability. Able to look after own affairs without assistance, but unable to carry out all previous activities.   3 - Moderate disability. Requires some help, but able to walk unassisted.   4 - Moderately severe disability. Unable to attend to own bodily needs without assistance, and unable to walk unassisted.   5 - Severe disability. Requires constant nursing care and attention, bedridden, incontinent.   6 - Dead.   NIH Stroke Scale = 0  Data reviewed: I personally reviewed the images and agree with the radiology interpretations.  Ct Head Wo Contrast 06/15/2016 1. No definite acute intracranial abnormalities.  2. There is a new densely calcified plaque in the right vertebral artery (as above) compared to prior study from 12/08/2009. Although not assessable on today's noncontrast CT examination, the appearance could suggest a hemodynamically significant stenosis. If there are signs or symptoms of vertebrobasilar insufficiency, further evaluation with noncontrast brain MRI/MRA could be considered.  3. Mild cerebral atrophy with mild chronic  microvascular ischemic changes in the cerebral white matter, as above.   [name] Head Wo Contrast  06/16/2016  MRI HEAD  1. Acute/early subacute ischemic nonhemorrhagic right PCA territory infarcts.  2. Moderate chronic microvascular ischemic disease.   MRA HEAD  1. Occlusion of the distal right PCA. Note is made of a fetal type right PCA.  2. Atheromatous irregularity with severe tandem nonocclusive the stenoses within the left PCA.  3. Severe nonocclusive left A2 stenosis.  4. Additional more mild atheromatous disease involving the anterior posterior circulation as above.   Transthoracic echocardiogram 06/15/2016 Study Conclusions - Left ventricle: The cavity size was normal. Wall thickness was normal. Systolic function was normal. The estimated ejection fraction was in the range of 60% to 65%. Wall motion was normal; there were no regional wall motion abnormalities. Doppler parameters are consistent with abnormal left ventricular relaxation (grade 1 diastolic dysfunction). - Mitral valve: There was mild regurgitation. - Pulmonary arteries: Systolic pressure was mildly increased. PA peak pressure: 35 mm Hg (S). - Pericardium, extracardiac: There was a left pleural effusion.  CUS No evidence of stenosis noted in bilateral carotid arteries. Vertebral arteries demonstrated antegrade flow  TEE - Normal LV function; small oscillating density on aortic valve-possible vegetation; trace AI; mild MR.  Component     Latest Ref Rng & Units 06/17/2016  Cholesterol     0 - 200 mg/dL 161200  Triglycerides     <  150 mg/dL 604203 (H)  HDL Cholesterol     >40 mg/dL 39 (L)  Total CHOL/HDL Ratio     RATIO 5.1  VLDL     0 - 40 mg/dL 41 (H)  LDL (calc)     0 - 99 mg/dL 540120 (H)  Hemoglobin [id number]     4.8 - 5.6 % 5.1  Mean Plasma Glucose     mg/dL 191100    Assessment: As you may recall, she is a 80 y.o. Caucasian female with PMH of hypertension, CKD, thyroid disease admitted on  06/13/2016 for AMS and UGIB.  MRI showed acute right PCA territory scattered infarcts. MRA showed distal right PCA occlusion, left PCA and left A2 high-grade stenosis, and no mycotic aneurysms. Carotid Doppler, TTE unremarkable. LDL 128 and A1c 5.1. Blood culture showed positive bacteremia with E. coli. She was put on antibiotics. Repeat culture negative. 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 She was put on PICC line and started antibiotics for 6 weeks. Had UGIB and anemia, received blood transfusion. EGD showed non-bleeding gastric and duodenal ulcers with no stigmata of bleeding. She was started on aspirin 81 and Zocor 20 for stroke prevention. Her neuro symptoms resolved and she was discharged home. However, on discharge her aspirin was discontinued. During the interval time, the patient has been doing well. No stroke like symptoms. No more black stools. She just finished 6 weeks IV antibiotics, PICC line removed yesterday. She followed with ID Dr. Ninetta LightsHatcher, will repeat blood culture. She stopped Zocor by herself due to no more refills. She is not taking NSAIDs anymore. Due to multiple intracranial stenosis and [facility], would like to continue antiplatelet. Due to gastric ulcer, would recommend plavix. Will resume zocor.   Plan:  - recommend plavix and zocor for stroke prevention - monitor black stool and bleeding precautions - check BP at home and record - continue follow up with Dr. Ninetta LightsHatcher - Follow up with your primary care physician for stroke risk factor modification. Recommend maintain blood pressure goal <130/80, diabetes with hemoglobin A1c goal below 7.0% and lipids with LDL cholesterol goal below 70 mg/dL.  - follow up in 4 months.   I spent more than 25 minutes of face to face time with the patient. Greater than 50% of time was spent in counseling and coordination of care. We discussed stroke  prevention with antiplatelet, bleeding precautions, continue statin and follow up with ID.  No orders of the defined types were placed in this encounter.   Meds ordered this encounter  Medications  . clopidogrel (PLAVIX) 75 MG tablet    Sig: Take 1 tablet (75 mg total) by mouth daily.    Dispense:  30 tablet    Refill:  3  . simvastatin (ZOCOR) 20 MG tablet    Sig: Take 1 tablet (20 mg total) by mouth daily.    Dispense:  90 tablet    Refill:  3    Patient Instructions  - recommend plavix and zocor for stroke prevention - monitor black stool and bleeding precautions - check BP at home and record - continue follow up with Dr. Ninetta LightsHatcher - Follow up with your primary care physician for stroke risk factor modification. Recommend maintain blood pressure goal <130/80, diabetes with hemoglobin A1c goal below 7.0% and lipids with LDL cholesterol goal below 70 mg/dL.  - follow up in 4 months.    Marvel PlanJindong Kwesi Sangha, MD PhD  [facility] [address] [place], [place] [address] [phone]

## 2016-08-04 NOTE — Patient Instructions (Addendum)
-   recommend plavix and zocor for stroke prevention - monitor black stool and bleeding precautions - check BP at home and record - continue follow up with Dr. Ninetta LightsHatcher - Follow up with your primary care physician for stroke risk factor modification. Recommend maintain blood pressure goal <130/80, diabetes with hemoglobin A1c goal below 7.0% and lipids with LDL cholesterol goal below 70 mg/dL.  - follow up in 4 months.

## 2016-08-05 ENCOUNTER — Other Ambulatory Visit: Payer: Self-pay | Admitting: Family Medicine

## 2016-08-05 ENCOUNTER — Encounter: Payer: Self-pay | Admitting: Infectious Diseases

## 2016-08-05 ENCOUNTER — Ambulatory Visit
Admission: RE | Admit: 2016-08-05 | Discharge: 2016-08-05 | Disposition: A | Discharge status: Home / Self Care | Payer: BC Managed Care – PPO | Source: Ambulatory Visit | Attending: Internal Medicine | Admitting: Internal Medicine

## 2016-08-05 DIAGNOSIS — R928 Other abnormal and inconclusive findings on diagnostic imaging of breast: Secondary | ICD-10-CM

## 2016-08-08 ENCOUNTER — Other Ambulatory Visit: Payer: BC Managed Care – PPO

## 2016-08-08 DIAGNOSIS — R7881 Bacteremia: Secondary | ICD-10-CM

## 2016-08-12 ENCOUNTER — Encounter: Payer: Self-pay | Admitting: Infectious Diseases

## 2016-08-15 LAB — CULTURE, BLOOD (SINGLE)
ORGANISM ID, BACTERIA: NO GROWTH
Organism ID, Bacteria: NO GROWTH

## 2016-08-16 ENCOUNTER — Encounter: Payer: Self-pay | Admitting: Infectious Diseases

## 2016-08-19 ENCOUNTER — Telehealth: Payer: Self-pay | Admitting: *Deleted

## 2016-08-19 NOTE — Telephone Encounter (Signed)
Late entry 08/01/16 verbal order given per Dr Ninetta LightsHatcher to Eunice Blaseebbie at [facility] to pull picc. Andree CossHowell, Michelle M, RN

## 2016-12-05 ENCOUNTER — Ambulatory Visit (INDEPENDENT_AMBULATORY_CARE_PROVIDER_SITE_OTHER): Payer: BC Managed Care – PPO | Admitting: Neurology

## 2016-12-05 ENCOUNTER — Encounter: Payer: Self-pay | Admitting: Neurology

## 2016-12-05 VITALS — BP 128/72 | HR 58 | Wt 109.4 lb

## 2016-12-05 DIAGNOSIS — E785 Hyperlipidemia, unspecified: Secondary | ICD-10-CM | POA: Diagnosis not present

## 2016-12-05 DIAGNOSIS — K922 Gastrointestinal hemorrhage, unspecified: Secondary | ICD-10-CM | POA: Diagnosis not present

## 2016-12-05 DIAGNOSIS — I10 Essential (primary) hypertension: Secondary | ICD-10-CM

## 2016-12-05 DIAGNOSIS — I330 Acute and subacute infective endocarditis: Secondary | ICD-10-CM

## 2016-12-05 DIAGNOSIS — I63431 Cerebral infarction due to embolism of right posterior cerebral artery: Secondary | ICD-10-CM | POA: Diagnosis not present

## 2016-12-05 NOTE — Progress Notes (Signed)
STROKE NEUROLOGY FOLLOW UP NOTE  NAME: [name] DOB: Aug 08, 1936  REASON FOR VISIT: stroke follow up HISTORY FROM: husband and pt and chart  Today we had the pleasure of seeing [name] in follow-up at our Neurology Clinic. Pt was accompanied by husband.   History Summary [name] is a 81 y.o. female with history of hypertension, chronic kidney disease, thyroid disease, recent upper GI bleed and gram negative rods bacteremia admitted on 06/13/2016 for AMS.  MRI showed acute right PCA territory scattered infarcts. MRA showed distal right PCA occlusion, left PCA and left A2 high-grade stenosis, and no mycotic aneurysms. Carotid Doppler, TTE unremarkable. LDL 128 and A1c 5.1. Blood culture showed positive bacteremia with Escherichia coli. She was put on antibiotics. Repeat culture negative. 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 She was put on PICC line and started every antibiotics for 6 weeks. Had UGIB and anemia, received blood transfusion. EGD showed non-bleeding gastric and duodenal ulcers with no stigmata of bleeding. She was started on aspirin 81 and Zocor 20 for stroke prevention. Her neuro symptoms resolved and she was discharged home. However, on discharge her aspirin was discontinued.   08/04/16 follow up - the patient has been doing well. No stroke like symptoms. She just finished 6 weeks IV antibiotics, PICC line removed yesterday. She followed with ID Dr. Ninetta LightsHatcher, will repeat blood culture. She stopped Zocor by herself due to no more refills. She is not taking NSAIDs anymore. On Norvasc and metoprolol for BP control, BP today 135/76. Otherwise, no other complaints  Interval History During the interval time, pt has been doing well. Finished Abx course and repeat culture negative. No more GIB. Pt BP today 128/72. She feels great and no complains.   REVIEW OF SYSTEMS: Full 14 system  review of systems performed and notable only for those listed below and in HPI above, all others are negative:  Constitutional:   Cardiovascular:  Ear/Nose/Throat:   Skin:  Eyes:   Respiratory:   Gastroitestinal:   Genitourinary:  Hematology/Lymphatic:   Endocrine:  Musculoskeletal:   Allergy/Immunology:   Neurological:   Psychiatric:  Sleep:   The following represents the patient's updated allergies and side effects list: Allergies  Allergen Reactions  . Amoxicillin Hives and Itching    Has patient had a PCN reaction causing immediate rash, facial/tongue/throat swelling, SOB or lightheadedness with hypotension: Yes Has patient had a PCN reaction causing severe rash involving mucus membranes or skin necrosis: Yes Has patient had a PCN reaction that required hospitalization No Has patient had a PCN reaction occurring within the last 10 years: Yes If all of the above answers are "NO", then may proceed with Cephalosporin use.     The neurologically relevant items on the patient's problem list were reviewed on today's visit.  Neurologic Examination  A problem focused neurological exam (12 or more points of the single system neurologic examination, vital signs counts as 1 point, cranial nerves count for 8 points) was performed.  Blood pressure 128/72, pulse (!) 58, weight 109 lb 6.4 oz (49.6 kg).  General - Well nourished, well developed, in no apparent distress.  Ophthalmologic - Sharp disc margins OU.   Cardiovascular - Regular rate and rhythm with no murmur.  Mental Status -  Level of arousal and orientation to time, place, and person were intact. Language including expression, naming, repetition, comprehension was assessed and found intact. Fund  of Knowledge was assessed and was intact.  Cranial Nerves II - XII - II - Visual field intact OU. III, IV, VI - Extraocular movements intact. V - Facial sensation intact bilaterally. VII - Facial movement intact  bilaterally. VIII - Hearing & vestibular intact bilaterally. X - Palate elevates symmetrically. XI - Chin turning & shoulder shrug intact bilaterally. XII - Tongue protrusion intact.  Motor Strength - The patient's strength was normal in all extremities and pronator drift was absent.  Bulk was normal and fasciculations were absent.   Motor Tone - Muscle tone was assessed at the neck and appendages and was normal.  Reflexes - The patient's reflexes were 1+ in all extremities and she had no pathological reflexes.  Sensory - Light touch, temperature/pinprick were assessed and were normal.    Coordination - The patient had normal movements in the hands and feet with no ataxia or dysmetria.  Tremor was absent.  Gait and Station - The patient's transfers, posture, gait, station, and turns were observed as normal.   Data reviewed: I personally reviewed the images and agree with the radiology interpretations.  Ct Head Wo Contrast 06/15/2016 1. No definite acute intracranial abnormalities.  2. There is a new densely calcified plaque in the right vertebral artery (as above) compared to prior study from 12/08/2009. Although not assessable on today's noncontrast CT examination, the appearance could suggest a hemodynamically significant stenosis. If there are signs or symptoms of vertebrobasilar insufficiency, further evaluation with noncontrast brain MRI/MRA could be considered.  3. Mild cerebral atrophy with mild chronic microvascular ischemic changes in the cerebral white matter, as above.   [name] Head Wo Contrast  06/16/2016  MRI HEAD  1. Acute/early subacute ischemic nonhemorrhagic right PCA territory infarcts.  2. Moderate chronic microvascular ischemic disease.   MRA HEAD  1. Occlusion of the distal right PCA. Note is made of a fetal type right PCA.  2. Atheromatous irregularity with severe tandem nonocclusive the stenoses within the left PCA.  3. Severe nonocclusive left A2  stenosis.  4. Additional more mild atheromatous disease involving the anterior posterior circulation as above.   Transthoracic echocardiogram 06/15/2016 Study Conclusions - Left ventricle: The cavity size was normal. Wall thickness was normal. Systolic function was normal. The estimated ejection fraction was in the range of 60% to 65%. Wall motion was normal; there were no regional wall motion abnormalities. Doppler parameters are consistent with abnormal left ventricular relaxation (grade 1 diastolic dysfunction). - Mitral valve: There was mild regurgitation. - Pulmonary arteries: Systolic pressure was mildly increased. PA peak pressure: 35 mm Hg (S). - Pericardium, extracardiac: There was a left pleural effusion.  CUS No evidence of stenosis noted in bilateral carotid arteries. Vertebral arteries demonstrated antegrade flow  TEE - Normal LV function; small oscillating density on aortic valve-possible vegetation; trace AI; mild MR.  Component     Latest Ref Rng & Units 06/17/2016  Cholesterol     0 - 200 mg/dL 161  Triglycerides     <150 mg/dL 096 (H)  HDL Cholesterol     >40 mg/dL 39 (L)  Total CHOL/HDL Ratio     RATIO 5.1  VLDL     0 - 40 mg/dL 41 (H)  LDL (calc)     0 - 99 mg/dL 045 (H)  Hemoglobin W0J     4.8 - 5.6 % 5.1  Mean Plasma Glucose     mg/dL 811    Assessment: As you may recall, she is a 81  y.o. Caucasian female with PMH of hypertension, CKD, thyroid disease admitted on 06/13/2016 for AMS and UGIB.  MRI showed acute right PCA territory scattered infarcts. MRA showed distal right PCA occlusion, left PCA and left A2 high-grade stenosis, and no mycotic aneurysms. Carotid Doppler, TTE unremarkable. LDL 128 and A1c 5.1. Blood culture showed positive bacteremia with E. coli. She was put on antibiotics. Repeat culture negative. 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  She was put on PICC line and started antibiotics for 6 weeks. Had UGIB and anemia, received blood transfusion. EGD showed non-bleeding gastric and duodenal ulcers with no stigmata of bleeding. She was started on aspirin 81 and Zocor 20 for stroke prevention. Her neuro symptoms resolved and she was discharged home. However, on discharge her aspirin was discontinued. During the interval time, the patient has been doing well. No stroke like symptoms. No more black stools. She just finished 6 weeks IV antibiotics, PICC line removed yesterday. She followed with ID Dr. Ninetta Lights and repeat blood culture negative. She stopped Zocor by herself due to no more refills. She is not taking NSAIDs anymore. Due to multiple intracranial stenosis and [facility] of stroke, she was put on plavix (avoid ASA due to gastric ulcer) and zocor. No more GIB and BP stable.   Plan:  - continue plavix and zocor for stroke prevention - bleeding precautions - check BP at home and record - Follow up with your primary care physician for stroke risk factor modification. Recommend maintain blood pressure goal <130/80, diabetes with hemoglobin A1c goal below 7.0% and lipids with LDL cholesterol goal below 70 mg/dL.  - follow up in as needed.   No orders of the defined types were placed in this encounter.   Meds ordered this encounter  Medications  . LORazepam (ATIVAN) 0.5 MG tablet    Sig: daily.     Patient Instructions  - continue plavix and zocor for stroke prevention - bleeding precautions - check BP at home and record - Follow up with your primary care physician for stroke risk factor modification. Recommend maintain blood pressure goal <130/80, diabetes with hemoglobin A1c goal below 7.0% and lipids with LDL cholesterol goal below 70 mg/dL.  - follow up in as needed.    Marvel Plan, MD PhD [facility] [address] [place], [place] [address] [phone]

## 2016-12-05 NOTE — Patient Instructions (Signed)
-   continue plavix and zocor for stroke prevention - bleeding precautions - check BP at home and record - Follow up with your primary care physician for stroke risk factor modification. Recommend maintain blood pressure goal <130/80, diabetes with hemoglobin A1c goal below 7.0% and lipids with LDL cholesterol goal below 70 mg/dL.  - follow up in as needed.

## 2016-12-09 ENCOUNTER — Other Ambulatory Visit: Payer: Self-pay | Admitting: Neurology

## 2016-12-09 DIAGNOSIS — I63431 Cerebral infarction due to embolism of right posterior cerebral artery: Secondary | ICD-10-CM

## 2016-12-15 ENCOUNTER — Emergency Department (HOSPITAL_COMMUNITY): Payer: BC Managed Care – PPO

## 2016-12-15 ENCOUNTER — Encounter (HOSPITAL_COMMUNITY): Payer: Self-pay | Admitting: Neurology

## 2016-12-15 ENCOUNTER — Emergency Department (HOSPITAL_COMMUNITY)
Admission: EM | Admit: 2016-12-15 | Discharge: 2016-12-15 | Disposition: A | Discharge status: Home / Self Care | Payer: BC Managed Care – PPO | Attending: Emergency Medicine | Admitting: Emergency Medicine

## 2016-12-15 ENCOUNTER — Other Ambulatory Visit: Payer: Self-pay | Admitting: Neurology

## 2016-12-15 DIAGNOSIS — W1839XA Other fall on same level, initial encounter: Secondary | ICD-10-CM | POA: Diagnosis not present

## 2016-12-15 DIAGNOSIS — R42 Dizziness and giddiness: Secondary | ICD-10-CM | POA: Insufficient documentation

## 2016-12-15 DIAGNOSIS — I129 Hypertensive chronic kidney disease with stage 1 through stage 4 chronic kidney disease, or unspecified chronic kidney disease: Secondary | ICD-10-CM | POA: Insufficient documentation

## 2016-12-15 DIAGNOSIS — Z79899 Other long term (current) drug therapy: Secondary | ICD-10-CM | POA: Insufficient documentation

## 2016-12-15 DIAGNOSIS — Z8673 Personal history of transient ischemic attack (TIA), and cerebral infarction without residual deficits: Secondary | ICD-10-CM | POA: Insufficient documentation

## 2016-12-15 DIAGNOSIS — Y929 Unspecified place or not applicable: Secondary | ICD-10-CM | POA: Diagnosis not present

## 2016-12-15 DIAGNOSIS — Y999 Unspecified external cause status: Secondary | ICD-10-CM | POA: Insufficient documentation

## 2016-12-15 DIAGNOSIS — R531 Weakness: Secondary | ICD-10-CM | POA: Diagnosis not present

## 2016-12-15 DIAGNOSIS — N183 Chronic kidney disease, stage 3 (moderate): Secondary | ICD-10-CM | POA: Diagnosis not present

## 2016-12-15 DIAGNOSIS — I63431 Cerebral infarction due to embolism of right posterior cerebral artery: Secondary | ICD-10-CM

## 2016-12-15 DIAGNOSIS — Y9301 Activity, walking, marching and hiking: Secondary | ICD-10-CM | POA: Insufficient documentation

## 2016-12-15 LAB — BASIC METABOLIC PANEL
Anion gap: 10 (ref 5–15)
BUN: 15 mg/dL (ref 6–20)
CO2: 25 mmol/L (ref 22–32)
CREATININE: 1.02 mg/dL — AB (ref 0.44–1.00)
Calcium: 9.9 mg/dL (ref 8.9–10.3)
Chloride: 107 mmol/L (ref 101–111)
GFR, EST AFRICAN AMERICAN: 59 mL/min — AB (ref >60)
GFR, EST NON AFRICAN AMERICAN: 51 mL/min — AB (ref >60)
Glucose, Bld: 98 mg/dL (ref 65–99)
Potassium: 3.8 mmol/L (ref 3.5–5.1)
SODIUM: 142 mmol/L (ref 135–145)

## 2016-12-15 LAB — URINALYSIS, ROUTINE W REFLEX MICROSCOPIC
Bilirubin Urine: NEGATIVE
Glucose, UA: NEGATIVE mg/dL
Hgb urine dipstick: NEGATIVE
KETONES UR: NEGATIVE mg/dL
LEUKOCYTES UA: NEGATIVE
NITRITE: NEGATIVE
PROTEIN: NEGATIVE mg/dL
Specific Gravity, Urine: 1.014 (ref 1.005–1.030)
pH: 6 (ref 5.0–8.0)

## 2016-12-15 LAB — CBC
HCT: 39.8 % (ref 36.0–46.0)
Hemoglobin: 12.9 g/dL (ref 12.0–15.0)
MCH: 29.9 pg (ref 26.0–34.0)
MCHC: 32.4 g/dL (ref 30.0–36.0)
MCV: 92.1 fL (ref 78.0–100.0)
PLATELETS: 202 10*3/uL (ref 150–400)
RBC: 4.32 MIL/uL (ref 3.87–5.11)
RDW: 13.9 % (ref 11.5–15.5)
WBC: 4.8 10*3/uL (ref 4.0–10.5)

## 2016-12-15 MED ORDER — CLOPIDOGREL BISULFATE 75 MG PO TABS: 75.0000 mg | ORAL_TABLET | Freq: Every day | ORAL | 0 refills | Status: DC

## 2016-12-15 MED ORDER — SODIUM CHLORIDE 0.9 % IV BOLUS (SEPSIS)
1000.0000 mL | Freq: Once | INTRAVENOUS | Status: AC
  Administered 2016-12-15: 1000 mL via INTRAVENOUS

## 2016-12-15 MED ORDER — CLOPIDOGREL BISULFATE 75 MG PO TABS
75.0000 mg | ORAL_TABLET | Freq: Once | ORAL | Status: AC
  Administered 2016-12-15: 75 mg via ORAL
  Filled 2016-12-15: qty 1

## 2016-12-15 NOTE — ED Notes (Signed)
Patient transported to CT 

## 2016-12-15 NOTE — ED Provider Notes (Signed)
[facility]-EMERGENCY DEPT Provider Note   CSN: [id number] Arrival date & time: 12/15/16  1558     History   Chief Complaint Chief Complaint  Patient presents with  . Fall  . Weakness    HPI [name] is a 81 y.o. female.  HPI  81 year old female with multiple medical problems as listed below presents to the emergency department today secondary to weakness. Patient states she was walking to the mailbox and she got weak and had to go down on her hands and knees. She says she didn't fall or syncopized. A neighbor came to help her get up patient's to be to stand. She has had decreased by mouth intake recently. No other medical problems. No chest pain, cough, short of breath, back pain, headache or other symptoms.   Past Medical History:  Diagnosis Date  . Blood transfusion without reported diagnosis   . CKD (chronic kidney disease), stage III   . Hearing loss    pt wears hearing aid in both ears  . Hypertension   . PONV (postoperative nausea and vomiting)    nausea  . Stroke (HCC) 06/2016  . Thyroid disease     Patient Active Problem List   Diagnosis Date Noted  . Hyperlipidemia 08/04/2016  . Acute bacterial endocarditis 08/04/2016  . Confusion   . Embolic stroke involving right posterior cerebral artery (HCC)   . E coli bacteremia   . Iron deficiency anemia due to chronic blood loss   . CAP (community acquired pneumonia) 06/13/2016  . Sepsis (HCC) 06/13/2016  . Nausea vomiting and diarrhea 06/13/2016  . Neutropenia (HCC) 06/13/2016  . Anxiety 06/13/2016  . UGIB (upper gastrointestinal bleed) 06/13/2016  . Hypokalemia 06/13/2016  . Acute renal failure superimposed on stage 3 chronic kidney disease (HCC)   . Essential hypertension   . Leukopenia   . AKI (acute kidney injury) (HCC)   . Dehydration   . Encounter for central line placement   . Difficult intravenous access     Past Surgical History:  Procedure Laterality Date  . ABDOMINAL HYSTERECTOMY    .  CHOLECYSTECTOMY N/A 08/13/2015   Procedure: LAPAROSCOPIC CHOLECYSTECTOMY WITH INTRAOPERATIVE CHOLANGIOGRAM;  Surgeon: Avel Peace, MD;  Location: [facility] OR;  Service: General;  Laterality: N/A;  . ESOPHAGOGASTRODUODENOSCOPY N/A 06/14/2016   Procedure: ESOPHAGOGASTRODUODENOSCOPY (EGD);  Surgeon: Jeani Hawking, MD;  Location: [facility] ENDOSCOPY;  Service: Endoscopy;  Laterality: N/A;  . TEE WITHOUT CARDIOVERSION N/A 06/21/2016   Procedure: TRANSESOPHAGEAL ECHOCARDIOGRAM (TEE);  Surgeon: Lewayne Bunting, MD;  Location: [facility] ENDOSCOPY;  Service: Cardiovascular;  Laterality: N/A;  . THYROIDECTOMY, PARTIAL      OB History    No data available       Home Medications    Prior to Admission medications   Medication Sig Start Date End Date Taking? Authorizing Provider  amLODipine (NORVASC) 5 MG tablet Take 1 tablet (5 mg total) by mouth daily. 06/22/16   Belkys A Regalado, MD  clopidogrel (PLAVIX) 75 MG tablet Take 1 tablet (75 mg total) by mouth daily. 08/04/16   Marvel Plan, MD  clopidogrel (PLAVIX) 75 MG tablet Take 1 tablet (75 mg total) by mouth daily. 12/15/16   Marily Memos, MD  LORazepam (ATIVAN) 0.5 MG tablet daily.  11/16/16   Historical Provider, MD  metoprolol tartrate (LOPRESSOR) 25 MG tablet Take 25 mg by mouth 2 (two) times daily.    Historical Provider, MD  simvastatin (ZOCOR) 20 MG tablet Take 1 tablet (20 mg total) by mouth daily. 08/04/16  Marvel PlanJindong Xu, MD    Family History Family History  Problem Relation Age of Onset  . Stroke Mother     Social History Social History  Substance Use Topics  . Smoking status: Never Smoker  . Smokeless tobacco: Never Used  . Alcohol use No     Allergies   Amoxicillin   Review of Systems Review of Systems  All other systems reviewed and are negative.    Physical Exam Updated Vital Signs BP (!) 159/82   Pulse 66   Temp 97.8 F (36.6 C) (Oral)   Resp 18   SpO2 100%   Physical Exam  Constitutional: She is oriented to person, place, and  time. She appears well-developed and well-nourished.  HENT:  Head: Normocephalic and atraumatic.  Eyes: Conjunctivae and EOM are normal.  Neck: Normal range of motion.  Cardiovascular: Normal rate and regular rhythm.   Pulmonary/Chest: Effort normal and breath sounds normal. No stridor. No respiratory distress. She has no wheezes.  Abdominal: Soft. She exhibits no distension. There is no tenderness. There is no guarding.  Musculoskeletal: Normal range of motion. She exhibits no edema or deformity.  Neurological: She is alert and oriented to person, place, and time.  Skin: Skin is warm and dry.  Nursing note and vitals reviewed.    ED Treatments / Results  Labs (all labs ordered are listed, but only abnormal results are displayed) Labs Reviewed  BASIC METABOLIC PANEL - Abnormal; Notable for the following:       Result Value   Creatinine, Ser 1.02 (*)    GFR calc non Af Amer 51 (*)    GFR calc Af Amer 59 (*)    All other components within normal limits  CBC  URINALYSIS, ROUTINE W REFLEX MICROSCOPIC  CBG MONITORING, ED    EKG  EKG Interpretation  Date/Time:  Thursday December 15 2016 16:19:53 EDT Ventricular Rate:  61 PR Interval:  190 QRS Duration: 74 QT Interval:  386 QTC Calculation: 388 R Axis:   33 Text Interpretation:  Normal sinus rhythm Anteroseptal infarct , age undetermined Abnormal ECG Confirmed by [facility] Hospital[facility]ES[facility] MD, Barbara CowerJASON [phone]([id number]) on 12/15/2016 6:03:25 PM       Radiology Ct Head Wo Contrast  Result Date: 12/15/2016 CLINICAL DATA:  Fall, on Plavix, has not been feeling well and has been feeling lightheaded, fell down today when going to the mailbox, uncertain if loss of consciousness, history hypertension, stroke EXAM: CT HEAD WITHOUT CONTRAST TECHNIQUE: Contiguous axial images were obtained from the base of the skull through the vertex without intravenous contrast. Sagittal and coronal MPR images reconstructed from axial data set. COMPARISON:  06/15/2016 FINDINGS: Brain:  Generalized atrophy. Normal ventricular morphology. No midline shift or mass effect. Small vessel chronic ischemic changes of deep cerebral white matter. No intracranial hemorrhage, mass lesion, evidence of acute infarction, or extra-axial fluid collection. Vascular: Atherosclerotic calcifications of internal carotid and vertebral arteries at skullbase including dense calcification at the RIGHT vertebral artery. Skull: Intact Sinuses/Orbits: Clear Other: N/A IMPRESSION: Atrophy with small vessel chronic ischemic changes of deep cerebral white matter. No acute intracranial abnormalities. Electronically Signed   By: Ulyses SouthwardMark  Boles M.D.   On: 12/15/2016 18:06    Procedures Procedures (including critical care time)  Medications Ordered in ED Medications  sodium chloride 0.9 % bolus 1,000 mL (0 mLs Intravenous Stopped 12/15/16 2125)  clopidogrel (PLAVIX) tablet 75 mg (75 mg Oral Given 12/15/16 2124)     Initial Impression / Assessment and Plan / ED Course  I have reviewed the triage vital signs and the nursing notes.  Pertinent labs & imaging results that were available during my care of the patient were reviewed by me and considered in my medical decision making (see chart for details).     Patient likely dehydrated and thus the weakness. This is secondary to decreased intake. On my exam she appears well. She is able to ambulate to the bathroom without difficulty after a liter of fluids. She feels much improved. Her family also states that her strength seems to be at her baseline.  No evidence for cardiac or neurologic causes for her symptoms.  She'll follow-up with her doctor in a few days to make sure kidney function is improved. They will also make sure that her vital signs with them are normal.  Final Clinical Impressions(s) / ED Diagnoses   Final diagnoses:  Weakness    New Prescriptions Discharge Medication List as of 12/15/2016  9:19 PM    START taking these medications   Details  !!  clopidogrel (PLAVIX) 75 MG tablet Take 1 tablet (75 mg total) by mouth daily., Starting Thu 12/15/2016, Print     !! - Potential duplicate medications found. Please discuss with provider.       Marily Memos, MD 12/15/16 [phone]

## 2016-12-15 NOTE — ED Triage Notes (Signed)
[facility] experiencing a fall today when walking to the mailbox. Per family patient has been experiencing lightheadedness for 3 days along with questionable disorientation. Orthostatic vitals were positive at office. Hx of CVA. Patient is ambulatory on arrival. Has difficulty hearing. Is currently on plavix.

## 2016-12-15 NOTE — ED Triage Notes (Signed)
For last few days hasn't been feeling well and has been feeling lightheaded. Yesterday her BP was high, so she went to bed and rested. Today she went to the mailbox and fell down. A neighbor saw her fall, and helped her get up. Unsure if LOC. Denies pain, is a x 4. At current BP 152/75. In summary, last few weeks she has been feeling weak with no energy. She is taking plavix, unsure if she hit her head.

## 2016-12-16 NOTE — Telephone Encounter (Signed)
[facility] [place] and advised of refills until patient can get refills from PCP. Advised she got 15 days supply in ED yesterday. He stated he saw both Rx. and he verbalized understanding of call.

## 2017-01-13 ENCOUNTER — Ambulatory Visit (INDEPENDENT_AMBULATORY_CARE_PROVIDER_SITE_OTHER): Payer: BC Managed Care – PPO | Admitting: Cardiovascular Disease

## 2017-01-13 ENCOUNTER — Encounter: Payer: Self-pay | Admitting: Cardiovascular Disease

## 2017-01-13 VITALS — BP 100/80 | HR 52 | Ht 60.0 in | Wt 112.0 lb

## 2017-01-13 DIAGNOSIS — I10 Essential (primary) hypertension: Secondary | ICD-10-CM

## 2017-01-13 DIAGNOSIS — I951 Orthostatic hypotension: Secondary | ICD-10-CM

## 2017-01-13 DIAGNOSIS — R0989 Other specified symptoms and signs involving the circulatory and respiratory systems: Secondary | ICD-10-CM

## 2017-01-13 DIAGNOSIS — E785 Hyperlipidemia, unspecified: Secondary | ICD-10-CM

## 2017-01-13 DIAGNOSIS — Z8679 Personal history of other diseases of the circulatory system: Secondary | ICD-10-CM

## 2017-01-13 MED ORDER — METOPROLOL TARTRATE 25 MG PO TABS: 12.5000 mg | ORAL_TABLET | Freq: Two times a day (BID) | ORAL | 3 refills | Status: AC

## 2017-01-13 MED ORDER — AMLODIPINE BESYLATE 5 MG PO TABS: 5.0000 mg | ORAL_TABLET | Freq: Every day | ORAL | 3 refills | Status: DC

## 2017-01-13 NOTE — Progress Notes (Signed)
Cardiology Office Note    Date:  01/13/2017   ID:  [name], DOB 03-24-1936, MRN [id number]  PCP:  Jonathon Bellows, MD  Cardiologist:  Shelva Majestic, MD   Chief Complaint  Patient presents with  . New Evaluation    By Dr. Maurice Small for evaluation of possible syncope and blood pressure lability    History of Present Illness:  [name] is a 81 y.o. female is referred through the courtesy of Dr. Justin Mend for evaluation of recent posible syncope resulting in a fall.  [name] has a history of hypertension, chronic kidney disease, thyroid disease, thousand 29 developed an upper GI bleed and gram-negative rides bacteremia.  She had altered mental status.  An MRI revealed acute right PCA territory with scattered infarcts.  An MRA demonstrated distal right PCA occlusion, PCA and left A2 high-grade stenosis.  There were no mycotic aneurysms.  A carotid Doppler and transthoracic echo were unremarkable.  Blood cultures were positive for Escherichia coli.  Cardiology consultation was never obtained but she underwent a TEE and was found to have a possible small vegetation on the aortic valve with trivial aortic insufficiency.  PICC line was placed and she was started on antibiotics which she received for 6 weeks.  She was evaluated by neurology and infectious disease was on the hospitalist service.  Subsequently, she has stabilized and has seen neurology in follow-up was advised to continue Plavix and Zocor for stroke prevention.  She has been followed by Dr. Justin Mend for primary care.  She has had blood pressure lability and had been on amlodipine 5 mg and Toprol-XL.  On 12/15/2016 she presented To Dr. Jason Nest office following a fall driveway while going to the mailbox.  She felt lightheaded and it was uncertain if she had a true syncopal spell.  She was unaware of any tachycardia palpitations.  She was referred to the ER and it was felt most likely that she was dehydrated leading to her weakness.  She  had  recently not been eating; she was hydrated with a liter of fluid and felt improved and sent home.  She saw Dr. Maurice Small in follow-up evaluation.  Her blood pressure was 179/87 and amlodipine was further titrated to 10 mg from 5 mg and lorazepam was decreased.  There was concern that her ECG may have shown a prior MI.  She is now referred for initial cardiology evaluation.   Past Medical History:  Diagnosis Date  . Blood transfusion without reported diagnosis   . CKD (chronic kidney disease), stage III   . Hearing loss    pt wears hearing aid in both ears  . Hypertension   . PONV (postoperative nausea and vomiting)    nausea  . Stroke ([place]) 06/2016  . Thyroid disease     Past Surgical History:  Procedure Laterality Date  . ABDOMINAL HYSTERECTOMY    . CHOLECYSTECTOMY N/A 08/13/2015   Procedure: LAPAROSCOPIC CHOLECYSTECTOMY WITH INTRAOPERATIVE CHOLANGIOGRAM;  Surgeon: Jackolyn Confer, MD;  Location: [facility];  Service: General;  Laterality: N/A;  . ESOPHAGOGASTRODUODENOSCOPY N/A 06/14/2016   Procedure: ESOPHAGOGASTRODUODENOSCOPY (EGD);  Surgeon: Carol Ada, MD;  Location: [facility] ENDOSCOPY;  Service: Endoscopy;  Laterality: N/A;  . TEE WITHOUT CARDIOVERSION N/A 06/21/2016   Procedure: TRANSESOPHAGEAL ECHOCARDIOGRAM (TEE);  Surgeon: Lelon Perla, MD;  Location: [facility] ENDOSCOPY;  Service: Cardiovascular;  Laterality: N/A;  . THYROIDECTOMY, PARTIAL      Current Medications: Outpatient Medications Prior to Visit  Medication Sig Dispense  Refill  . clopidogrel (PLAVIX) 75 MG tablet take 1 tablet by mouth once daily 30 tablet 1  . LORazepam (ATIVAN) 0.5 MG tablet daily.     . simvastatin (ZOCOR) 20 MG tablet Take 1 tablet (20 mg total) by mouth daily. 90 tablet 3  . amLODipine (NORVASC) 5 MG tablet Take 1 tablet (5 mg total) by mouth daily. 30 tablet 0  . metoprolol tartrate (LOPRESSOR) 25 MG tablet Take 25 mg by mouth 2 (two) times daily.     No facility-administered medications prior to  visit.      Allergies:   Amoxicillin   Social History   Social History  . Marital status: Married    Spouse name: N/A  . Number of children: N/A  . Years of education: N/A   Social History Main Topics  . Smoking status: Never Smoker  . Smokeless tobacco: Never Used  . Alcohol use No  . Drug use: No  . Sexual activity: Not Asked   Other Topics Concern  . None   Social History Narrative  . 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  She is originally from [place] and move back following her education. . Family History:  The patient's family history includes Heart attack in her father; Hypertension in her mother; Other in her daughter; Stroke in her mother.   ROS General: Negative; No fevers, chills, or night sweats;  Regional dizziness HEENT: Negative; No changes in vision or hearing, sinus congestion, difficulty swallowing Pulmonary: Negative; No cough, wheezing, shortness of breath, hemoptysis Cardiovascular: Negative; No chest pain, presyncope, syncope, palpitations GI: Negative; No nausea, vomiting, diarrhea, or abdominal pain GU: Negative; No dysuria, hematuria, or difficulty voiding Musculoskeletal: Negative; no myalgias, joint pain, or weakness Hematologic/Oncology: Negative; no easy bruising, bleeding Endocrine: Negative; no heat/cold intolerance; no diabetes Neuro: Negative; no changes in balance, headaches Skin: Negative; No rashes or skin lesions Psychiatric: Negative; No behavioral problems, depression Sleep: Negative; No snoring, daytime sleepiness, hypersomnolence, bruxism, restless legs, hypnogognic hallucinations, no cataplexy Other comprehensive 14 point system review is negative.   PHYSICAL EXAM:   VS:  BP 100/80 (BP Location: Left Arm, Patient Position: Sitting, Cuff Size: Normal)   Pulse (!) 52   Ht 5' (1.524 m)   Wt 112 lb (50.8 kg)   BMI 21.87 kg/m     Repeat  blood pressure by me was 98/70 supine, and this dropped 84/64 with standing.  Wt Readings from Last 3 Encounters:  01/13/17 112 lb (50.8 kg)  12/05/16 109 lb 6.4 oz (49.6 kg)  08/04/16 107 lb 9.6 oz (48.8 kg)    General: Alert, oriented, no distress.  Skin: normal turgor, no rashes, warm and dry HEENT: Normocephalic, atraumatic. Pupils equal round and reactive to light; sclera anicteric; extraocular muscles intact; Fundi no urges or exudates, disc flat Nose without nasal septal hypertrophy Mouth/Parynx benign; Mallinpatti scale 3 Neck: No JVD, no carotid bruits; normal carotid upstroke Lungs: clear to ausculatation and percussion; no wheezing or rales Chest wall: without tenderness to palpitation Heart: PMI not displaced, RRR, s1 s2 normal, 1/6 systolic murmur, no diastolic murmur appreciated;  no rubs, gallops, thrills, or heaves Abdomen: soft, nontender; no hepatosplenomehaly, BS+; abdominal aorta nontender and not dilated by palpation. Back: no CVA tenderness Pulses 2+ Musculoskeletal: full range of motion, normal strength, no joint deformities Extremities: no clubbing cyanosis or edema, Homan's sign negative  Neurologic: grossly  nonfocal; Cranial nerves grossly wnl Psychologic: Normal mood and affect   Studies/Labs Reviewed:   EKG:  EKG is ordered today.  ECG (independently read by me): Sinus bradycardia 52 bpm.  Borderline LVH by voltage.  PR interval 196 Amanda, QTc interval 383 Amanda.  Recent Labs: BMP Latest Ref Rng & Units 12/15/2016 06/20/2016 06/19/2016  Glucose 65 - 99 mg/dL 98 101(H) 102(H)  BUN 6 - 20 mg/dL 15 19 22(H)  Creatinine 0.44 - 1.00 mg/dL 1.02(H) 0.96 0.94  Sodium 135 - 145 mmol/L 142 139 140  Potassium 3.5 - 5.1 mmol/L 3.8 4.0 4.1  Chloride 101 - 111 mmol/L 107 102 105  CO2 22 - 32 mmol/L 25 28 28   Calcium 8.9 - 10.3 mg/dL 9.9 10.0 9.6     Hepatic Function Latest Ref Rng & Units 06/15/2016 06/13/2016 08/07/2015  Total Protein 6.5 - 8.1 g/dL 5.0(L) 5.2(L) 6.4(L)    Albumin 3.5 - 5.0 g/dL 2.4(L) 3.0(L) 3.7  AST 15 - 41 U/L 49(H) 25 19  ALT 14 - 54 U/L 95(H) 15 15  Alk Phosphatase 38 - 126 U/L 90 54 62  Total Bilirubin 0.3 - 1.2 mg/dL 0.6 0.8 0.8    CBC Latest Ref Rng & Units 12/15/2016 06/20/2016 06/19/2016  WBC 4.0 - 10.5 K/uL 4.8 9.4 8.3  Hemoglobin 12.0 - 15.0 g/dL 12.9 12.9 12.0  Hematocrit 36.0 - 46.0 % 39.8 40.2 37.3  Platelets 150 - 400 K/uL 202 245 234   Lab Results  Component Value Date   MCV 92.1 12/15/2016   MCV 94.8 06/20/2016   MCV 94.7 06/19/2016   Lab Results  Component Value Date   TSH 0.433 06/14/2016   Lab Results  Component Value Date   HGBA1C 5.1 06/17/2016     BNP No results found for: BNP  ProBNP No results found for: PROBNP   Lipid Panel     Component Value Date/Time   CHOL 200 06/17/2016 0340   TRIG 203 (H) 06/17/2016 0340   HDL 39 (L) 06/17/2016 0340   CHOLHDL 5.1 06/17/2016 0340   VLDL 41 (H) 06/17/2016 0340   LDLCALC 120 (H) 06/17/2016 0340     RADIOLOGY: Ct Head Wo Contrast  Result Date: 12/15/2016 CLINICAL DATA:  Fall, on Plavix, has not been feeling well and has been feeling lightheaded, fell down today when going to the mailbox, uncertain if loss of consciousness, history hypertension, stroke EXAM: CT HEAD WITHOUT CONTRAST TECHNIQUE: Contiguous axial images were obtained from the base of the skull through the vertex without intravenous contrast. Sagittal and coronal MPR images reconstructed from axial data set. COMPARISON:  06/15/2016 FINDINGS: Brain: Generalized atrophy. Normal ventricular morphology. No midline shift or mass effect. Small vessel chronic ischemic changes of deep cerebral white matter. No intracranial hemorrhage, mass lesion, evidence of acute infarction, or extra-axial fluid collection. Vascular: Atherosclerotic calcifications of internal carotid and vertebral arteries at skullbase including dense calcification at the RIGHT vertebral artery. Skull: Intact Sinuses/Orbits: Clear  Other: N/A IMPRESSION: Atrophy with small vessel chronic ischemic changes of deep cerebral white matter. No acute intracranial abnormalities. Electronically Signed   By: Lavonia Dana M.D.   On: 12/15/2016 18:06     Additional studies/ records that were reviewed today include:  I reviewed the patient's hospital records from her September 2017 admission, her TEE, logic evaluations, subsequent ER visit, and office visits at [facility] with Dr. Maurice Small.    ASSESSMENT:    1. Endocarditis, unspecified chronicity, unspecified endocarditis type   2.  Labile hypertension      PLAN:  [name] is a very pleasant 81 year old female resident greater than 30 year history of hypertension, and in September 2017 developed an upper GI bleed.  I am negative rod bacteremia secondary to Escherichia coli at time was felt to have a possible small aortic valve vegetation for which she was treated with 6 weeks of antibiotic therapy.  She denies any history of tachycardia.  She has had issues with blood pressure lability.  At times, she has not been eating well.  March, she developed a presyncopal/syncopal spell leading to a fall while walking to the mailbox.  She ultimately was felt to be dehydrated and her symptoms improved hydration.  She previously has been found to have normal systolic function but has not had a follow-up echo Doppler study since her TEE evaluation which demonstrated the small oscillating density in the aortic valve which could possibly have been a vegetation.  Easily, she has had blood pressure lability leading to increasing amlodipine dosing up to 10 mg.  She has continued to be on metoprolol tartrate 25 mg twice a day.  On exam today is she is significantly orthostatic and dropped her blood pressure to 84/64 with standing.  I have recommended she reduce her amlodipine down to 5 mg and since she is bradycardia at a heart rate of 52,  I will decrease metoprolol to 12.5 mg twice a day.   She has remote history of peptic ulcer disease.  She has not noticed any blood in her stools.  She denies any chest pain.  Her ECG shows sinus bradycardia and by my review, there is no indication of prior myocardial infarction. She will monitor blood pressure at home.  Significant blood pressure lability.  I will also schedule her for a renal duplex study to make certain there is no renal vascular etiology to this lability.  She is on statin therapy for prevention and her LDL was elevated in September 2017.  Repeat laboratory.  I will see her back in the office in 4 weeks for reevaluation.  Medication Adjustments/Labs and Tests Ordered: Current medicines are reviewed at length with the patient today.  Concerns regarding medicines are outlined above.  Medication changes, Labs and Tests ordered today are listed in the Patient Instructions below. Patient Instructions  Dr Claiborne Billings has recommended making the following medication changes: 1. DECREASE Metoprolol to 0.5 tablet (12.5 mg total) twice daily 2. DECREASE Amlodipine to 5 mg daily  Your physician has requested that you have an echocardiogram. Echocardiography is a painless test that uses sound waves to create images of your heart. It provides your doctor with information about the size and shape of your heart and how well your heart's chambers and valves are working. This procedure takes approximately one hour. There are no restrictions for this procedure. This will be performed at our [facility] location - [address], [place] has requested that you have a renal artery duplex. During this test, an ultrasound is used to evaluate blood flow to the kidneys. Allow one hour for this exam. Do not eat after midnight the day before and avoid carbonated beverages. Take your medications as you usually do. This will be performed here at our [place] location.  Dr Claiborne Billings recommends that you schedule a follow-up appointment in 1 month.  If  you need a refill on your cardiac medications before your next appointment, please call your pharmacy.    Signed,  Shelva Majestic, MD  01/13/2017 [facility] [address], [place], [place]  [address] Phone: [phone]

## 2017-01-13 NOTE — Patient Instructions (Signed)
Dr Tresa Endo has recommended making the following medication changes: 1. DECREASE Metoprolol to 0.5 tablet (12.5 mg total) twice daily 2. DECREASE Amlodipine to 5 mg daily  Your physician has requested that you have an echocardiogram. Echocardiography is a painless test that uses sound waves to create images of your heart. It provides your doctor with information about the size and shape of your heart and how well your heart's chambers and valves are working. This procedure takes approximately one hour. There are no restrictions for this procedure. This will be performed at our [facility] location - [address], [address]  Your physician has requested that you have a renal artery duplex. During this test, an ultrasound is used to evaluate blood flow to the kidneys. Allow one hour for this exam. Do not eat after midnight the day before and avoid carbonated beverages. Take your medications as you usually do. This will be performed here at our [place] location.  Dr Tresa Endo recommends that you schedule a follow-up appointment in 1 month.  If you need a refill on your cardiac medications before your next appointment, please call your pharmacy.

## 2017-01-24 ENCOUNTER — Telehealth: Payer: Self-pay | Admitting: *Deleted

## 2017-01-24 NOTE — Telephone Encounter (Signed)
Spoke with patient regarding date and time change for Echocardiogram.  Tuesday 02/07/17 at 9:30 am.  Patient voiced her understanding.

## 2017-01-25 ENCOUNTER — Ambulatory Visit (HOSPITAL_COMMUNITY)
Admission: RE | Admit: 2017-01-25 | Discharge: 2017-01-25 | Disposition: A | Discharge status: Home / Self Care | Payer: BC Managed Care – PPO | Source: Ambulatory Visit | Attending: Cardiovascular Disease | Admitting: Cardiovascular Disease

## 2017-01-25 DIAGNOSIS — R0989 Other specified symptoms and signs involving the circulatory and respiratory systems: Secondary | ICD-10-CM | POA: Diagnosis present

## 2017-01-25 DIAGNOSIS — I10 Essential (primary) hypertension: Secondary | ICD-10-CM | POA: Diagnosis not present

## 2017-01-31 ENCOUNTER — Telehealth: Payer: Self-pay | Admitting: Cardiovascular Disease

## 2017-01-31 ENCOUNTER — Other Ambulatory Visit (HOSPITAL_COMMUNITY): Payer: BC Managed Care – PPO

## 2017-01-31 NOTE — Telephone Encounter (Signed)
Received records from [facility] for appointment on 02/13/17 with Dr Tresa Endo.  Records put with Dr Landry Dyke schedule for 02/13/17. lp

## 2017-02-07 ENCOUNTER — Other Ambulatory Visit (HOSPITAL_COMMUNITY): Payer: BC Managed Care – PPO

## 2017-02-13 ENCOUNTER — Ambulatory Visit: Payer: BC Managed Care – PPO | Admitting: Cardiovascular Disease

## 2017-02-13 ENCOUNTER — Telehealth: Payer: Self-pay | Admitting: *Deleted

## 2017-02-13 NOTE — Telephone Encounter (Signed)
Called patient and notified her of her renal doppler results.

## 2017-02-14 ENCOUNTER — Other Ambulatory Visit: Payer: Self-pay

## 2017-02-14 ENCOUNTER — Ambulatory Visit (HOSPITAL_COMMUNITY): Payer: BC Managed Care – PPO | Attending: Cardiology

## 2017-02-14 DIAGNOSIS — I119 Hypertensive heart disease without heart failure: Secondary | ICD-10-CM | POA: Diagnosis not present

## 2017-02-14 DIAGNOSIS — Z8679 Personal history of other diseases of the circulatory system: Secondary | ICD-10-CM

## 2017-02-14 DIAGNOSIS — E785 Hyperlipidemia, unspecified: Secondary | ICD-10-CM | POA: Diagnosis not present

## 2017-02-14 DIAGNOSIS — Z8249 Family history of ischemic heart disease and other diseases of the circulatory system: Secondary | ICD-10-CM | POA: Diagnosis not present

## 2017-02-14 DIAGNOSIS — I340 Nonrheumatic mitral (valve) insufficiency: Secondary | ICD-10-CM | POA: Diagnosis not present

## 2017-02-14 DIAGNOSIS — I361 Nonrheumatic tricuspid (valve) insufficiency: Secondary | ICD-10-CM | POA: Diagnosis not present

## 2017-03-07 ENCOUNTER — Encounter: Payer: Self-pay | Admitting: Cardiovascular Disease

## 2017-03-07 ENCOUNTER — Ambulatory Visit (INDEPENDENT_AMBULATORY_CARE_PROVIDER_SITE_OTHER): Payer: BC Managed Care – PPO | Admitting: Cardiovascular Disease

## 2017-03-07 VITALS — BP 162/80 | HR 60 | Ht 60.0 in | Wt 118.0 lb

## 2017-03-07 DIAGNOSIS — I358 Other nonrheumatic aortic valve disorders: Secondary | ICD-10-CM

## 2017-03-07 DIAGNOSIS — E782 Mixed hyperlipidemia: Secondary | ICD-10-CM | POA: Diagnosis not present

## 2017-03-07 DIAGNOSIS — I10 Essential (primary) hypertension: Secondary | ICD-10-CM

## 2017-03-07 DIAGNOSIS — Z8679 Personal history of other diseases of the circulatory system: Secondary | ICD-10-CM | POA: Diagnosis not present

## 2017-03-07 DIAGNOSIS — R0989 Other specified symptoms and signs involving the circulatory and respiratory systems: Secondary | ICD-10-CM

## 2017-03-07 DIAGNOSIS — Z79899 Other long term (current) drug therapy: Secondary | ICD-10-CM | POA: Diagnosis not present

## 2017-03-07 MED ORDER — AMLODIPINE BESYLATE 5 MG PO TABS: 5.0000 mg | ORAL_TABLET | Freq: Every day | ORAL | 3 refills | Status: DC

## 2017-03-07 MED ORDER — VALSARTAN 160 MG PO TABS: 160.0000 mg | ORAL_TABLET | Freq: Every day | ORAL | 3 refills | Status: DC

## 2017-03-07 NOTE — Progress Notes (Signed)
Cardiology Office Note    Date:  03/09/2017   ID:  [name], DOB 12-27-1935, MRN [id number]  PCP:  Maurice Small, MD  Cardiologist:  Shelva Majestic, MD   Chief Complaint  Patient presents with  . New Evaluation    By Dr. Maurice Small for evaluation of possible syncope and blood pressure lability    History of Present Illness:  [name] is a 81 y.o. female is referred through the courtesy of Dr. Justin Mend for evaluation of recent posible syncope resulting in a fall.  [name] has a history of hypertension, chronic kidney disease, thyroid disease, thousand 76 developed an upper GI bleed and gram-negative rides bacteremia.  She had altered mental status.  An MRI revealed acute right PCA territory with scattered infarcts.  An MRA demonstrated distal right PCA occlusion, PCA and left A2 high-grade stenosis.  There were no mycotic aneurysms.  A carotid Doppler and transthoracic echo were unremarkable.  Blood cultures were positive for Escherichia coli.  Cardiology consultation was never obtained but she underwent a TEE and was found to have a possible small vegetation on the aortic valve with trivial aortic insufficiency.  PICC line was placed and she was started on antibiotics which she received for 6 weeks.  She was evaluated by neurology and infectious disease was on the hospitalist service.  Subsequently, she has stabilized and has seen neurology in follow-up was advised to continue Plavix and Zocor for stroke prevention.  She has been followed by Dr. Justin Mend for primary care.  She has had blood pressure lability and had been on amlodipine 5 mg and Toprol-XL.  On 12/15/2016 she presented To Dr. Jason Nest office following a fall driveway while going to the mailbox.  She felt lightheaded and it was uncertain if she had a true syncopal spell.  She was unaware of any tachycardia palpitations.  She was referred to the ER and it was felt most likely that she was dehydrated leading to her weakness.  She  had  recently not been eating; she was hydrated with a liter of fluid and felt improved and sent home.  She saw Dr. Maurice Small in follow-up evaluation.  Her blood pressure was 179/87 and amlodipine was further titrated to 10 mg from 5 mg and lorazepam was decreased.  There was concern that her ECG may have shown a prior MI.    When I initially saw her in April 2018.  She has significant orthostatic hypotension and I recommended that she reduce her amlodipine down to 5 mg and she was bradycardic and metoprolol dose was reduced to 12.5 mg twice a day.  I scheduled her for renal duplex study to make certain there was no renal vascular etiology to her blood pressure lability.  This revealed normal caliber abdominal aorta with normal renal arteries bilaterally.  Normal bilateral kidney size.  She states her blood pressure typically has been running in the 150-180 range in the morning.  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  At times she has noticed some occasional swelling of her feet and ankles.  She presents for evaluation.   Past Medical History:  Diagnosis Date  . Blood transfusion without reported diagnosis   . CKD (chronic kidney disease), stage III   . Hearing loss    pt wears hearing aid in both ears  . Hypertension   . PONV (postoperative nausea and vomiting)    nausea  . Stroke ([place]) 06/2016  . Thyroid disease  Past Surgical History:  Procedure Laterality Date  . ABDOMINAL HYSTERECTOMY    . CHOLECYSTECTOMY N/A 08/13/2015   Procedure: LAPAROSCOPIC CHOLECYSTECTOMY WITH INTRAOPERATIVE CHOLANGIOGRAM;  Surgeon: Jackolyn Confer, MD;  Location: [place] [facility];  Service: General;  Laterality: N/A;  . ESOPHAGOGASTRODUODENOSCOPY N/A 06/14/2016   Procedure: ESOPHAGOGASTRODUODENOSCOPY (EGD);  Surgeon: Carol Ada, MD;  Location: [facility] ENDOSCOPY;  Service: Endoscopy;  Laterality: N/A;  . TEE WITHOUT CARDIOVERSION N/A 06/21/2016   Procedure: TRANSESOPHAGEAL ECHOCARDIOGRAM (TEE);  Surgeon: Lelon Perla, MD;  Location: [facility] ENDOSCOPY;  Service: Cardiovascular;  Laterality: N/A;  . THYROIDECTOMY, PARTIAL      Current Medications: Outpatient Medications Prior to Visit  Medication Sig Dispense Refill  . clopidogrel (PLAVIX) 75 MG tablet take 1 tablet by mouth once daily 30 tablet 1  . escitalopram (LEXAPRO) 5 MG tablet Take 1 tablet by mouth daily.    Marland Kitchen LORazepam (ATIVAN) 0.5 MG tablet daily.     . metoprolol tartrate (LOPRESSOR) 25 MG tablet Take 0.5 tablets (12.5 mg total) by mouth 2 (two) times daily. 90 tablet 3  . simvastatin (ZOCOR) 20 MG tablet Take 1 tablet (20 mg total) by mouth daily. 90 tablet 3  . amLODipine (NORVASC) 5 MG tablet Take 1 tablet (5 mg total) by mouth daily. 90 tablet 3   No facility-administered medications prior to visit.      Allergies:   Amoxicillin   Social History   Social History  . Marital status: Married    Spouse name: N/A  . Number of children: N/A  . Years of education: N/A   Social History Main Topics  . Smoking status: Never Smoker  . Smokeless tobacco: Never Used  . Alcohol use No  . Drug use: No  . Sexual activity: Not Asked   Other Topics Concern  . None   Social History Narrative  . 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  She is originally from [place] and move back following her education. . Family History:  The patient's family history includes Heart attack in her father; Hypertension in her mother; Other in her daughter; Stroke in her mother.   ROS General: Negative; No fevers, chills, or night sweats;  Regional dizziness HEENT: Negative; No changes in vision or hearing, sinus congestion, difficulty swallowing Pulmonary: Negative; No cough, wheezing, shortness of breath, hemoptysis Cardiovascular: Negative; No chest pain, presyncope, syncope, palpitations GI: Negative; No nausea, vomiting, diarrhea, or abdominal pain GU: Negative;  No dysuria, hematuria, or difficulty voiding Musculoskeletal: Negative; no myalgias, joint pain, or weakness Hematologic/Oncology: Negative; no easy bruising, bleeding Endocrine: Negative; no heat/cold intolerance; no diabetes Neuro: Negative; no changes in balance, headaches Skin: Negative; No rashes or skin lesions Psychiatric: Negative; No behavioral problems, depression Sleep: Negative; No snoring, daytime sleepiness, hypersomnolence, bruxism, restless legs, hypnogognic hallucinations, no cataplexy Other comprehensive 14 point system review is negative.   PHYSICAL EXAM:   VS:  BP (!) 162/80   Pulse 60   Ht 5' (1.524 m)   Wt 118 lb (53.5 kg)   BMI 23.05 kg/m     Repeat blood pressure by me was 164/84 without orthostatic change  Wt Readings from Last 3 Encounters:  03/07/17 118 lb (53.5 kg)  01/13/17 112 lb (50.8 kg)  12/05/16 109 lb 6.4 oz (49.6 kg)     General: Alert, oriented, no distress.  Skin: normal turgor, no rashes, warm and dry HEENT: Normocephalic,  atraumatic. Pupils equal round and reactive to light; sclera anicteric; extraocular muscles intact; disc flat.  No hemorrhages or exudates Nose without nasal septal hypertrophy Mouth/Parynx benign; Mallinpatti scale 3 Neck: No JVD, no carotid bruits; normal carotid upstroke Lungs: clear to ausculatation and percussion; no wheezing or rales Chest wall: without tenderness to palpitation Heart: PMI not displaced, RRR, s1 s2 normal, 1/6 systolic murmur, no diastolic murmur, no rubs, gallops, thrills, or heaves Abdomen: soft, nontender; no hepatosplenomehaly, BS+; abdominal aorta nontender and not dilated by palpation. Back: no CVA tenderness Pulses 2+ Musculoskeletal: full range of motion, normal strength, no joint deformities Extremities: no clubbing cyanosis or edema, Homan's sign negative  Neurologic: grossly nonfocal; Cranial nerves grossly wnl Psychologic: Normal mood and affect    Studies/Labs Reviewed:   EKG:   EKG is ordered today.  ECG (independently read by me): Normal sinus rhythm at 60 bpm.  No ectopy.  No significant ST-T change.  QTc interval 392 Amanda.  April 2018 ECG (independently read by me): Sinus bradycardia 52 bpm.  Borderline LVH by voltage.  PR interval 196 Amanda, QTc interval 383 Amanda.  Recent Labs: BMP Latest Ref Rng & Units 12/15/2016 06/20/2016 06/19/2016  Glucose 65 - 99 mg/dL 98 101(H) 102(H)  BUN 6 - 20 mg/dL 15 19 22(H)  Creatinine 0.44 - 1.00 mg/dL 1.02(H) 0.96 0.94  Sodium 135 - 145 mmol/L 142 139 140  Potassium 3.5 - 5.1 mmol/L 3.8 4.0 4.1  Chloride 101 - 111 mmol/L 107 102 105  CO2 22 - 32 mmol/L _0 Calcium 8.9 - 10.3 mg/dL 9.9 10.0 9.6     Hepatic Function Latest Ref Rng & Units 06/15/2016 06/13/2016 08/07/2015  Total Protein 6.5 - 8.1 g/dL 5.0(L) 5.2(L) 6.4(L)  Albumin 3.5 - 5.0 g/dL 2.4(L) 3.0(L) 3.7  AST 15 - 41 U/L 49(H) 25 19  ALT 14 - 54 U/L 95(H) 15 15  Alk Phosphatase 38 - 126 U/L 90 54 62  Total Bilirubin 0.3 - 1.2 mg/dL 0.6 0.8 0.8    CBC Latest Ref Rng & Units 12/15/2016 06/20/2016 06/19/2016  WBC 4.0 - 10.5 K/uL 4.8 9.4 8.3  Hemoglobin 12.0 - 15.0 g/dL 12.9 12.9 12.0  Hematocrit 36.0 - 46.0 % 39.8 40.2 37.3  Platelets 150 - 400 K/uL 202 245 234   Lab Results  Component Value Date   MCV 92.1 12/15/2016   MCV 94.8 06/20/2016   MCV 94.7 06/19/2016   Lab Results  Component Value Date   TSH 0.433 06/14/2016   Lab Results  Component Value Date   HGBA1C 5.1 06/17/2016     BNP No results found for: BNP  ProBNP No results found for: PROBNP   Lipid Panel     Component Value Date/Time   CHOL 200 06/17/2016 0340   TRIG 203 (H) 06/17/2016 0340   HDL 39 (L) 06/17/2016 0340   CHOLHDL 5.1 06/17/2016 0340   VLDL 41 (H) 06/17/2016 0340   LDLCALC 120 (H) 06/17/2016 0340     RADIOLOGY: No results found.   Additional studies/ records that were reviewed today include:  I reviewed the patient's hospital records from her September 2017  admission, her TEE, logic evaluations, subsequent ER visit, and office visits at [facility] physicians with Dr. Maurice Small.    ASSESSMENT:    1. Essential hypertension   2. Medication management   3. Labile hypertension   4. History of endocarditis   5. Aortic valve sclerosis   6. Mixed hyperlipidemia  PLAN:  [name] is a very pleasant 81 year old female who has a greater than 30 year history of hypertension, and in September 2017 developed an upper GI bleed and had gram negative rod bacteremia secondary to Escherichia coli at time was felt to have a possible small aortic valve vegetation for which she was treated with 6 weeks of antibiotic therapy.  She denies any history of tachycardia.  She has had issues with blood pressure lability.  At times, she has not been eating well.  March, she developed a presyncopal/syncopal spell leading to a fall while walking to the mailbox.  She ultimately was felt to be dehydrated and her symptoms improved hydration.  She had previously been found to have a small oscillating density in the aortic valve, raising the possibility of a vegetation.  Since I last saw her, I recommended that she undergo a 2-D echo Doppler study which was done on 02/14/2017.  This showed normal EF at 55-60%. There was mild aortic sclerosis with trace AR, mild AR and mild TR without mention of her previously noted oscillating density.  She has had issues with significant blood pressure lability and when I saw her initially, she was orthostatic, which led to dose reduction.  Presently, she has noticed her blood pressure to be elevated in the early morning hours.  Her blood pressure was elevated today at 164/84.  I am adding valsartan and she will initially take 80 mg for the first 4 days and then titrate this to 160 mg in the morning.  I am changing her amlodipine from 5 mg in the morning and she will take this at bedtime which should provide additional blood pressure lowering in the  early morning hours.  She is no longer bradycardic on her reduced Lopressor dose at 12.5 mg twice a day and she will continue at this present regimen. In  2-3 weeks she will undergo a follow-up bmet to reassess renal function and potassium status with the addition of ARB therapy.  Her renal Doppler study was reviewed and did not show renal artery stenosis.  She has been on simvastatin by her primary physician for probable mixed hyperlipidemia.  Her lipid studies remain elevated when last checked, and she may require dose modification and may benefit from changing from simvastatin to either rosuvastatin or atorvastatin, particularly with concomitant amlodipine since  20 mg of simvastatin is the  recommended maximum dose with amlodipine therapy. I will see her in 3 months for reevaluation or sooner if problems arise.   Medication Adjustments/Labs and Tests Ordered: Current medicines are reviewed at length with the patient today.  Concerns regarding medicines are outlined above.  Medication changes, Labs and Tests ordered today are listed in the Patient Instructions below. Patient Instructions  Your physician has recommended you make the following change in your medication:   1.) take the amlodipine at night instead of the morning.  2.) start the new valsartan 160 mg prescription. Take 1/2 tablet for the first 4 days, then increase to 1 tablet daily.   Your physician recommends that you return for lab work in: 3-4 weeks. YOU DO NOT NEED TO FAST.   Your doctor has ordered you to have blood work. You may go to any lab you choose, however there is a [facility] lab located [place] [address]. Check in with the front office staff. NO APPOINTMENT IS NEED. However they are usually gone between the hours of 1-2 for lunch. The Nurse/Medical Assistant will give you  instructions on the need to fast.  You will receive your test results via mail, phone call, or by My chart.  Your physician recommends that you  schedule a follow-up appointment in: 3 months.       Signed, Shelva Majestic, MD  03/09/2017 7:16 PM    [facility] [address] [place], [place], [place]  [address] Phone: [phone]

## 2017-03-07 NOTE — Patient Instructions (Addendum)
Your physician has recommended you make the following change in your medication:   1.) take the amlodipine at night instead of the morning.  2.) start the new valsartan 160 mg prescription. Take 1/2 tablet for the first 4 days, then increase to 1 tablet daily.   Your physician recommends that you return for lab work in: 3-4 weeks. YOU DO NOT NEED TO FAST.   Your doctor has ordered you to have blood work. You may go to any lab you choose, however there is a [facility] lab located HERE IN OUR OFFICE. Check in with the front office staff. NO APPOINTMENT IS NEED. However they are usually gone between the hours of 1-2 for lunch. The Nurse/Medical Assistant will give you instructions on the need to fast.  You will receive your test results via mail, phone call, or by My chart.  Your physician recommends that you schedule a follow-up appointment in: 3 months.

## 2017-03-31 ENCOUNTER — Other Ambulatory Visit: Payer: Self-pay | Admitting: Family Medicine

## 2017-03-31 DIAGNOSIS — R921 Mammographic calcification found on diagnostic imaging of breast: Secondary | ICD-10-CM

## 2017-04-17 ENCOUNTER — Ambulatory Visit
Admission: RE | Admit: 2017-04-17 | Discharge: 2017-04-17 | Disposition: A | Discharge status: Home / Self Care | Payer: BC Managed Care – PPO | Source: Ambulatory Visit | Attending: Family Medicine | Admitting: Family Medicine

## 2017-04-17 DIAGNOSIS — R921 Mammographic calcification found on diagnostic imaging of breast: Secondary | ICD-10-CM

## 2017-04-18 LAB — BASIC METABOLIC PANEL
BUN/Creatinine Ratio: 20 (ref 12–28)
BUN: 23 mg/dL (ref 8–27)
CALCIUM: 10.6 mg/dL — AB (ref 8.7–10.3)
CHLORIDE: 108 mmol/L — AB (ref 96–106)
CO2: 21 mmol/L (ref 20–29)
Creatinine, Ser: 1.14 mg/dL — ABNORMAL HIGH (ref 0.57–1.00)
GFR calc Af Amer: 53 mL/min/{1.73_m2} — ABNORMAL LOW (ref >59)
GFR calc non Af Amer: 46 mL/min/{1.73_m2} — ABNORMAL LOW (ref >59)
GLUCOSE: 80 mg/dL (ref 65–99)
Potassium: 6.3 mmol/L — ABNORMAL HIGH (ref 3.5–5.2)
Sodium: 145 mmol/L — ABNORMAL HIGH (ref 134–144)

## 2017-04-21 ENCOUNTER — Other Ambulatory Visit: Payer: Self-pay | Admitting: *Deleted

## 2017-04-21 DIAGNOSIS — E875 Hyperkalemia: Secondary | ICD-10-CM

## 2017-06-23 ENCOUNTER — Ambulatory Visit (INDEPENDENT_AMBULATORY_CARE_PROVIDER_SITE_OTHER): Payer: BC Managed Care – PPO | Admitting: Cardiovascular Disease

## 2017-06-23 ENCOUNTER — Encounter: Payer: Self-pay | Admitting: Cardiovascular Disease

## 2017-06-23 VITALS — BP 130/84 | HR 54 | Ht 60.0 in | Wt 118.0 lb

## 2017-06-23 DIAGNOSIS — E785 Hyperlipidemia, unspecified: Secondary | ICD-10-CM

## 2017-06-23 DIAGNOSIS — I358 Other nonrheumatic aortic valve disorders: Secondary | ICD-10-CM

## 2017-06-23 DIAGNOSIS — I10 Essential (primary) hypertension: Secondary | ICD-10-CM | POA: Diagnosis not present

## 2017-06-23 DIAGNOSIS — Z8679 Personal history of other diseases of the circulatory system: Secondary | ICD-10-CM

## 2017-06-23 NOTE — Patient Instructions (Signed)
Medication Instructions:  Your physician recommends that you continue on your current medications as directed. Please refer to the Current Medication list given to you toda  Follow-Up: Your physician wants you to follow-up in: 12 MONTHS with Dr. Tresa Endo. You will receive a reminder letter in the mail two months in advance. If you don't receive a letter, please call our office to schedule the follow-up appointment.   Any Other Special Instructions Will Be Listed Below (If Applicable).     If you need a refill on your cardiac medications before your next appointment, please call your pharmacy.

## 2017-06-23 NOTE — Progress Notes (Signed)
Cardiology Office Note    Date:  06/25/2017   ID:  [name], DOB 10/20/1935, MRN [id number]  PCP:  Maurice Small, MD  Cardiologist:  Shelva Majestic, MD   Chief Complaint  Patient presents with  . New Evaluation    By Dr. Maurice Small for evaluation of possible syncope and blood pressure lability    History of Present Illness:  [name] is a 81 y.o. female who was referred through the courtesy of Dr. Justin Mend for evaluation of recent posible syncope resulting in a fall.I saw her for initial evaluation in April 2018, and follow-up in June 2018.  She presents for three-month follow-up evaluation.  [name] has a history of hypertension, chronic kidney disease, thyroid disease, and in 2017 developed an upper GI bleed and gram-negative rides bacteremia.  She had altered mental status.  An MRI revealed acute right PCA territory with scattered infarcts.  An MRA demonstrated distal right PCA occlusion, PCA and left A2 high-grade stenosis.  There were no mycotic aneurysms.  A carotid Doppler and transthoracic echo were unremarkable.  Blood cultures were positive for Escherichia coli.  Cardiology consultation was never obtained but she underwent a TEE and was found to have a possible small vegetation on the aortic valve with trivial aortic insufficiency.  PICC line was placed and she was started on antibiotics which she received for 6 weeks.  She was evaluated by neurology and infectious disease was on the hospitalist service.  Subsequently, she has stabilized and has seen neurology in follow-up was advised to continue Plavix and Zocor for stroke prevention.  She has been followed by Dr. Justin Mend for primary care.  She has had blood pressure lability and had been on amlodipine 5 mg and Toprol-XL.  On 12/15/2016 she presented To Dr. Jason Nest office following a fall driveway while going to the mailbox.  She felt lightheaded and it was uncertain if she had a true syncopal spell.  She was unaware of any  tachycardia palpitations.  She was referred to the ER and it was felt most likely that she was dehydrated leading to her weakness.  She  had recently not been eating; she was hydrated with a liter of fluid and felt improved and sent home.  She saw Dr. Maurice Small in follow-up evaluation.  Her blood pressure was 179/87 and amlodipine was further titrated to 10 mg from 5 mg and lorazepam was decreased.  There was concern that her ECG may have shown a prior MI.    When I initially saw her in April 2018.  She has significant orthostatic hypotension and I recommended that she reduce her amlodipine down to 5 mg and she was bradycardic and metoprolol dose was reduced to 12.5 mg twice a day.  I scheduled her for renal duplex study to make certain there was no renal vascular etiology to her blood pressure lability.  This revealed normal caliber abdominal aorta with normal renal arteries bilaterally.  Normal bilateral kidney size.  An echo Doppler study done in May 2018 showed normal EF of 55-60% with mild aortic sclerosis with trace AR, mild MR and TR.  Her blood pressure was elevated in the early morning hours and I added valsartan, initially at 80 mg and titrated to 160 mg.  On her medical regimen, she has felt improved and is now on amlodipine 5 mg, valsartan 160 mg and metoprolol, tartrate 12.5 mg twice a day.  She was never notified concerning valsartan impurity in her generic  product and continues to take the prescribed dose.  She continues to be on simvastatin 20 mg daily for hyperlipidemia and also is on clopidogrel 75 mg daily.  She admits to some fatigue.  She broke her left kneecap several weeks ago.  She denies chest pain or palpitations.  She presents for reevaluation.  Past Medical History:  Diagnosis Date  . Blood transfusion without reported diagnosis   . CKD (chronic kidney disease), stage III   . Hearing loss    pt wears hearing aid in both ears  . Hypertension   . PONV (postoperative nausea and  vomiting)    nausea  . Stroke ([place]) 06/2016  . Thyroid disease     Past Surgical History:  Procedure Laterality Date  . ABDOMINAL HYSTERECTOMY    . CHOLECYSTECTOMY N/A 08/13/2015   Procedure: LAPAROSCOPIC CHOLECYSTECTOMY WITH INTRAOPERATIVE CHOLANGIOGRAM;  Surgeon: Jackolyn Confer, MD;  Location: [facility];  Service: General;  Laterality: N/A;  . ESOPHAGOGASTRODUODENOSCOPY N/A 06/14/2016   Procedure: ESOPHAGOGASTRODUODENOSCOPY (EGD);  Surgeon: Carol Ada, MD;  Location[facility] ENDOSCOPY;  Service: Endoscopy;  Laterality: N/A;  . TEE WITHOUT CARDIOVERSION N/A 06/21/2016   Procedure: TRANSESOPHAGEAL ECHOCARDIOGRAM (TEE);  Surgeon: Lelon Perla, MD;  Location: [facility] ENDOSCOPY;  Service: Cardiovascular;  Laterality: N/A;  . THYROIDECTOMY, PARTIAL      Current Medications: Outpatient Medications Prior to Visit  Medication Sig Dispense Refill  . amLODipine (NORVASC) 5 MG tablet Take 1 tablet (5 mg total) by mouth at bedtime. 90 tablet 3  . clopidogrel (PLAVIX) 75 MG tablet take 1 tablet by mouth once daily 30 tablet 1  . escitalopram (LEXAPRO) 5 MG tablet Take 1 tablet by mouth daily.    . metoprolol tartrate (LOPRESSOR) 25 MG tablet Take 0.5 tablets (12.5 mg total) by mouth 2 (two) times daily. 90 tablet 3  . simvastatin (ZOCOR) 20 MG tablet Take 1 tablet (20 mg total) by mouth daily. 90 tablet 3  . valsartan (DIOVAN) 160 MG tablet Take 1 tablet (160 mg total) by mouth daily after breakfast. 90 tablet 3  . LORazepam (ATIVAN) 0.5 MG tablet daily.      No facility-administered medications prior to visit.      Allergies:   Amoxicillin   Social History   Social History  . Marital status: Married    Spouse name: N/A  . Number of children: N/A  . Years of education: N/A   Social History Main Topics  . Smoking status: Never Smoker  . Smokeless tobacco: Never Used  . Alcohol use No  . Drug use: No  . Sexual activity: Not Asked   Other Topics Concern  . None   Social History Narrative    . 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  She is originally from [place] and move back following her education. . Family History:  The patient's family history includes Heart attack in her father; Hypertension in her mother; Other in her daughter; Stroke in her mother.   ROS General: Negative; No fevers, chills, or night sweats;  Regional dizziness HEENT: Negative; No changes in vision or hearing, sinus congestion, difficulty swallowing Pulmonary: Negative; No cough, wheezing, shortness of breath, hemoptysis Cardiovascular: Negative; No chest pain, presyncope, syncope, palpitations GI: Negative; No nausea, vomiting, diarrhea, or abdominal pain GU: Negative; No dysuria, hematuria, or difficulty voiding Musculoskeletal: Negative; no myalgias, joint pain, or weakness Hematologic/Oncology: Negative; no easy bruising,  bleeding Endocrine: Negative; no heat/cold intolerance; no diabetes Neuro: Negative; no changes in balance, headaches Skin: Negative; No rashes or skin lesions Psychiatric: Negative; No behavioral problems, depression Sleep: Negative; No snoring, daytime sleepiness, hypersomnolence, bruxism, restless legs, hypnogognic hallucinations, no cataplexy Other comprehensive 14 point system review is negative.   PHYSICAL EXAM:   VS:  BP 130/84   Pulse (!) 54   Ht 5' (1.524 m)   Wt 118 lb (53.5 kg)   LMP  (LMP Unknown)   BMI 23.05 kg/m     Repeat blood pressure by me was 122/76 supine and 118/74 standing  Wt Readings from Last 3 Encounters:  06/23/17 118 lb (53.5 kg)  03/07/17 118 lb (53.5 kg)  01/13/17 112 lb (50.8 kg)    General: Alert, oriented, no distress.  Skin: normal turgor, no rashes, warm and dry HEENT: Normocephalic, atraumatic. Pupils equal round and reactive to light; sclera anicteric; extraocular muscles intact;  Nose without nasal septal hypertrophy Mouth/Parynx benign;  Mallinpatti scale 3 Neck: No JVD, no carotid bruits; normal carotid upstroke Lungs: clear to ausculatation and percussion; no wheezing or rales Chest wall: without tenderness to palpitation Heart: PMI not displaced, RRR, s1 s2 normal, 1/6 systolic murmur, no diastolic murmur, no rubs, gallops, thrills, or heaves Abdomen: soft, nontender; no hepatosplenomehaly, BS+; abdominal aorta nontender and not dilated by palpation. Back: no CVA tenderness Pulses 2+ Musculoskeletal: full range of motion, normal strength, no joint deformities Extremities: no clubbing cyanosis or edema, Homan's sign negative  Neurologic: grossly nonfocal; Cranial nerves grossly wnl Psychologic: Normal mood and affect    Studies/Labs Reviewed:   EKG:  EKG is ordered today. ECG (independently read by me): Sinus bradycardia 54 bpm with first-degree AV block, PR interval 210 Amanda.  QTc interval 373 Amanda.  June 2018 ECG (independently read by me): Normal sinus rhythm at 60 bpm.  No ectopy.  No significant ST-T change.  QTc interval 392 Amanda.  April 2018 ECG (independently read by me): Sinus bradycardia 52 bpm.  Borderline LVH by voltage.  PR interval 196 Amanda, QTc interval 383 Amanda.  Recent Labs: BMP Latest Ref Rng & Units 04/17/2017 12/15/2016 06/20/2016  Glucose 65 - 99 mg/dL 80 98 101(H)  BUN 8 - 27 mg/dL _0 Creatinine 0.57 - 1.00 mg/dL 1.14(H) 1.02(H) 0.96  BUN/Creat Ratio 12 - 28 20 - -  Sodium 134 - 144 mmol/L 145(H) 142 139  Potassium 3.5 - 5.2 mmol/L 6.3(H) 3.8 4.0  Chloride 96 - 106 mmol/L 108(H) 107 102  CO2 20 - 29 mmol/L _1 Calcium 8.7 - 10.3 mg/dL 10.6(H) 9.9 10.0     Hepatic Function Latest Ref Rng & Units 06/15/2016 06/13/2016 08/07/2015  Total Protein 6.5 - 8.1 g/dL 5.0(L) 5.2(L) 6.4(L)  Albumin 3.5 - 5.0 g/dL 2.4(L) 3.0(L) 3.7  AST 15 - 41 U/L 49(H) 25 19  ALT 14 - 54 U/L 95(H) 15 15  Alk Phosphatase 38 - 126 U/L 90 54 62  Total Bilirubin 0.3 - 1.2 mg/dL 0.6 0.8 0.8    CBC Latest Ref Rng &  Units 12/15/2016 06/20/2016 06/19/2016  WBC 4.0 - 10.5 K/uL 4.8 9.4 8.3  Hemoglobin 12.0 - 15.0 g/dL 12.9 12.9 12.0  Hematocrit 36.0 - 46.0 % 39.8 40.2 37.3  Platelets 150 - 400 K/uL 202 245 234   Lab Results  Component Value Date   MCV 92.1 12/15/2016   MCV 94.8 06/20/2016   MCV 94.7 06/19/2016   Lab Results  Component  Value Date   TSH 0.433 06/14/2016   Lab Results  Component Value Date   HGBA1C 5.1 06/17/2016     BNP No results found for: BNP  ProBNP No results found for: PROBNP   Lipid Panel     Component Value Date/Time   CHOL 200 06/17/2016 0340   TRIG 203 (H) 06/17/2016 0340   HDL 39 (L) 06/17/2016 0340   CHOLHDL 5.1 06/17/2016 0340   VLDL 41 (H) 06/17/2016 0340   LDLCALC 120 (H) 06/17/2016 0340     RADIOLOGY: No results found.   Additional studies/ records that were reviewed today include:  I reviewed the patient's hospital records from her September 2017 admission, her TEE, logic evaluations, subsequent ER visit, and office visits at [facility] physicians with Dr. Maurice Small.    ASSESSMENT:    1. Essential hypertension   2. Aortic valve sclerosis   3. Hyperlipidemia with target LDL less than 70   4. History of endocarditis      PLAN:  [name] is a very pleasant 81 year old female who has a greater than 30 year history of hypertension, and in September 2017 developed an upper GI bleed and had gram negative rod bacteremia secondary to Escherichia coli at time was felt to have a possible small aortic valve vegetation for which she was treated with 6 weeks of antibiotic therapy  She had previously been found to have a small oscillating density in the aortic valve, raising the possibility of a vegetation.  Since I last saw her, I recommended that she undergo a 2-D echo Doppler study which was done on 02/14/2017.  This showed normal EF at 55-60%. There was mild aortic sclerosis with trace AR, mild MR and mild TR without mention of her previously noted  oscillating density.  She has had issues with significant blood pressure lability and when I saw her initially, she was orthostatic, which led to dose reduction.  Presently, her blood pressure is stable with headaches.  Symptoms on her current regimen consisting of valsartan 160 mg, metoprolol tartrate 12.5 mg twice a day, and amlodipine 5 mg.  She's not having any chest pain.  She's not having palpitations.  Her ECG shows sinus bradycardia.  She admits to some fatigue.   She continues to be on simvastatin for hyperlipidemia.  Her laboratories being checked by Dr. Maurice Small.  Her renal function and follow-up of initiation of the be therapy remained stable.  She will be following up with Dr. read.  I will see her in one year for cardiology reevaluation or sooner if problems arise.  Medication Adjustments/Labs and Tests Ordered: Current medicines are reviewed at length with the patient today.  Concerns regarding medicines are outlined above.  Medication changes, Labs and Tests ordered today are listed in the Patient Instructions below. Patient Instructions  Medication Instructions:  Your physician recommends that you continue on your current medications as directed. Please refer to the Current Medication list given to you toda  Follow-Up: Your physician wants you to follow-up in: 12 MONTHS with Dr. Claiborne Billings. You will receive a reminder letter in the mail two months in advance. If you don't receive a letter, please call our office to schedule the follow-up appointment.   Any Other Special Instructions Will Be Listed Below (If Applicable).     If you need a refill on your cardiac medications before your next appointment, please call your pharmacy.      Signed, Shelva Majestic, MD  06/25/2017 9:56 PM  [facility] [address] [place], [place], [place]  [address] Phone: [phone]

## 2017-09-15 ENCOUNTER — Other Ambulatory Visit: Payer: Self-pay | Admitting: Neurology

## 2017-09-15 DIAGNOSIS — E785 Hyperlipidemia, unspecified: Secondary | ICD-10-CM

## 2017-10-18 ENCOUNTER — Telehealth: Payer: Self-pay

## 2017-10-18 NOTE — Telephone Encounter (Signed)
   [facility] Pre-operative Risk Assessment    Request for surgical clearance:  1. What type of surgery is being performed? RIGHT LUNG FINGER DEEP IMPLANT REMOVAL   2. When is this surgery scheduled?  10-25-2017   3. Are there any medications that need to be held prior to surgery and how long? PLAVIX   4. Practice name and name of physician performing surgery? [facility]   5. What is your office phone and fax number? [phone]   [phone]   6. Anesthesia type (None, local, MAC, general) ? NONE LISTED   Waylan Rocher 10/18/2017, 4:06 PM  _________________________________________________________________   (provider comments below)

## 2017-10-19 ENCOUNTER — Telehealth: Payer: Self-pay | Admitting: Cardiology

## 2017-10-19 NOTE — Telephone Encounter (Signed)
I called [name] -she is going to have surgery on her hand. She has not had chest pain and does not have CAD. She can have surgery without further cardiac work up. She is on Plavix because of a CVA and I asked her to check with her neurologist (Dr Xu) about stopping that.   Sathvika Ojo PA-C 10/19/2017 3:55 PM 

## 2017-11-16 NOTE — Telephone Encounter (Signed)
I called pt -she is going to have surgery on her hand. She has not had chest pain and does not have CAD. She can have surgery without further cardiac work up. She is on Plavix because of a CVA and I asked her to check with her neurologist (Dr Roda ShuttersXu) about stopping that.   Corine ShelterLUKE KILROY PA-C 10/19/2017 3:55 PM

## 2018-01-01 ENCOUNTER — Other Ambulatory Visit: Payer: Self-pay | Admitting: Neurology

## 2018-01-01 DIAGNOSIS — E785 Hyperlipidemia, unspecified: Secondary | ICD-10-CM

## 2018-04-02 ENCOUNTER — Other Ambulatory Visit: Payer: Self-pay | Admitting: Cardiovascular Disease

## 2018-06-28 ENCOUNTER — Other Ambulatory Visit: Payer: Self-pay | Admitting: Cardiovascular Disease

## 2018-10-20 ENCOUNTER — Other Ambulatory Visit: Payer: Self-pay | Admitting: Cardiovascular Disease

## 2018-10-22 NOTE — Telephone Encounter (Signed)
Rx has been sent to the pharmacy electronically. ° °

## 2018-11-20 ENCOUNTER — Other Ambulatory Visit: Payer: Self-pay | Admitting: Cardiovascular Disease

## 2018-12-20 ENCOUNTER — Other Ambulatory Visit: Payer: Self-pay | Admitting: Cardiovascular Disease

## 2019-03-12 ENCOUNTER — Other Ambulatory Visit: Payer: Self-pay | Admitting: Nephrology

## 2019-03-12 DIAGNOSIS — N183 Chronic kidney disease, stage 3 unspecified: Secondary | ICD-10-CM

## 2019-03-13 ENCOUNTER — Ambulatory Visit
Admission: RE | Admit: 2019-03-13 | Discharge: 2019-03-13 | Disposition: A | Discharge status: Home / Self Care | Payer: BC Managed Care – PPO | Source: Ambulatory Visit | Attending: Nephrology | Admitting: Nephrology

## 2019-03-13 DIAGNOSIS — N183 Chronic kidney disease, stage 3 unspecified: Secondary | ICD-10-CM

## 2019-03-19 ENCOUNTER — Other Ambulatory Visit: Payer: Self-pay | Admitting: Cardiovascular Disease

## 2019-04-01 ENCOUNTER — Other Ambulatory Visit: Payer: Self-pay | Admitting: Cardiovascular Disease

## 2019-04-01 NOTE — Telephone Encounter (Signed)
Rx(s) sent to pharmacy electronically.  

## 2020-01-23 ENCOUNTER — Inpatient Hospital Stay (HOSPITAL_COMMUNITY)
Admission: EM | Admit: 2020-01-23 | Discharge: 2020-01-25 | DRG: 379 | Disposition: A | Discharge status: Home Health | Payer: BC Managed Care – PPO | Attending: Internal Medicine | Admitting: Internal Medicine

## 2020-01-23 ENCOUNTER — Encounter (HOSPITAL_COMMUNITY): Payer: Self-pay | Admitting: Emergency Medicine

## 2020-01-23 ENCOUNTER — Emergency Department (HOSPITAL_COMMUNITY): Payer: BC Managed Care – PPO

## 2020-01-23 ENCOUNTER — Other Ambulatory Visit: Payer: Self-pay

## 2020-01-23 DIAGNOSIS — E039 Hypothyroidism, unspecified: Secondary | ICD-10-CM | POA: Diagnosis present

## 2020-01-23 DIAGNOSIS — E860 Dehydration: Secondary | ICD-10-CM | POA: Diagnosis present

## 2020-01-23 DIAGNOSIS — Z79899 Other long term (current) drug therapy: Secondary | ICD-10-CM | POA: Diagnosis not present

## 2020-01-23 DIAGNOSIS — F419 Anxiety disorder, unspecified: Secondary | ICD-10-CM | POA: Diagnosis present

## 2020-01-23 DIAGNOSIS — F329 Major depressive disorder, single episode, unspecified: Secondary | ICD-10-CM | POA: Diagnosis present

## 2020-01-23 DIAGNOSIS — Z791 Long term (current) use of non-steroidal anti-inflammatories (NSAID): Secondary | ICD-10-CM | POA: Diagnosis not present

## 2020-01-23 DIAGNOSIS — Z974 Presence of external hearing-aid: Secondary | ICD-10-CM

## 2020-01-23 DIAGNOSIS — Z7902 Long term (current) use of antithrombotics/antiplatelets: Secondary | ICD-10-CM | POA: Diagnosis not present

## 2020-01-23 DIAGNOSIS — I10 Essential (primary) hypertension: Secondary | ICD-10-CM | POA: Diagnosis not present

## 2020-01-23 DIAGNOSIS — Z8249 Family history of ischemic heart disease and other diseases of the circulatory system: Secondary | ICD-10-CM

## 2020-01-23 DIAGNOSIS — N183 Chronic kidney disease, stage 3 unspecified: Secondary | ICD-10-CM | POA: Diagnosis present

## 2020-01-23 DIAGNOSIS — Z880 Allergy status to penicillin: Secondary | ICD-10-CM

## 2020-01-23 DIAGNOSIS — K920 Hematemesis: Secondary | ICD-10-CM

## 2020-01-23 DIAGNOSIS — K449 Diaphragmatic hernia without obstruction or gangrene: Secondary | ICD-10-CM | POA: Diagnosis present

## 2020-01-23 DIAGNOSIS — H9190 Unspecified hearing loss, unspecified ear: Secondary | ICD-10-CM | POA: Diagnosis present

## 2020-01-23 DIAGNOSIS — K922 Gastrointestinal hemorrhage, unspecified: Secondary | ICD-10-CM

## 2020-01-23 DIAGNOSIS — E785 Hyperlipidemia, unspecified: Secondary | ICD-10-CM | POA: Diagnosis present

## 2020-01-23 DIAGNOSIS — K264 Chronic or unspecified duodenal ulcer with hemorrhage: Secondary | ICD-10-CM | POA: Diagnosis present

## 2020-01-23 DIAGNOSIS — Z8673 Personal history of transient ischemic attack (TIA), and cerebral infarction without residual deficits: Secondary | ICD-10-CM | POA: Diagnosis not present

## 2020-01-23 DIAGNOSIS — I129 Hypertensive chronic kidney disease with stage 1 through stage 4 chronic kidney disease, or unspecified chronic kidney disease: Secondary | ICD-10-CM | POA: Diagnosis present

## 2020-01-23 DIAGNOSIS — Z823 Family history of stroke: Secondary | ICD-10-CM

## 2020-01-23 DIAGNOSIS — I63431 Cerebral infarction due to embolism of right posterior cerebral artery: Secondary | ICD-10-CM

## 2020-01-23 DIAGNOSIS — Z20822 Contact with and (suspected) exposure to covid-19: Secondary | ICD-10-CM | POA: Diagnosis present

## 2020-01-23 LAB — COMPREHENSIVE METABOLIC PANEL
ALT: 11 U/L (ref 0–44)
AST: 13 U/L — ABNORMAL LOW (ref 15–41)
Albumin: 3.5 g/dL (ref 3.5–5.0)
Alkaline Phosphatase: 57 U/L (ref 38–126)
Anion gap: 6 (ref 5–15)
BUN: 62 mg/dL — ABNORMAL HIGH (ref 8–23)
CO2: 23 mmol/L (ref 22–32)
Calcium: 9.1 mg/dL (ref 8.9–10.3)
Chloride: 114 mmol/L — ABNORMAL HIGH (ref 98–111)
Creatinine, Ser: 1.2 mg/dL — ABNORMAL HIGH (ref 0.44–1.00)
GFR calc Af Amer: 49 mL/min — ABNORMAL LOW (ref >60)
GFR calc non Af Amer: 42 mL/min — ABNORMAL LOW (ref >60)
Glucose, Bld: 93 mg/dL (ref 70–99)
Potassium: 4.2 mmol/L (ref 3.5–5.1)
Sodium: 143 mmol/L (ref 135–145)
Total Bilirubin: 0.7 mg/dL (ref 0.3–1.2)
Total Protein: 6.2 g/dL — ABNORMAL LOW (ref 6.5–8.1)

## 2020-01-23 LAB — POC OCCULT BLOOD, ED: Fecal Occult Bld: POSITIVE — AB

## 2020-01-23 LAB — CBC WITH DIFFERENTIAL/PLATELET
Abs Immature Granulocytes: 0.01 10*3/uL (ref 0.00–0.07)
Basophils Absolute: 0.1 10*3/uL (ref 0.0–0.1)
Basophils Relative: 1 %
Eosinophils Absolute: 0 10*3/uL (ref 0.0–0.5)
Eosinophils Relative: 0 %
HCT: 30.8 % — ABNORMAL LOW (ref 36.0–46.0)
Hemoglobin: 9.6 g/dL — ABNORMAL LOW (ref 12.0–15.0)
Immature Granulocytes: 0 %
Lymphocytes Relative: 16 %
Lymphs Abs: 1.1 10*3/uL (ref 0.7–4.0)
MCH: 29.8 pg (ref 26.0–34.0)
MCHC: 31.2 g/dL (ref 30.0–36.0)
MCV: 95.7 fL (ref 80.0–100.0)
Monocytes Absolute: 0.5 10*3/uL (ref 0.1–1.0)
Monocytes Relative: 7 %
Neutro Abs: 5.3 10*3/uL (ref 1.7–7.7)
Neutrophils Relative %: 76 %
Platelets: 266 10*3/uL (ref 150–400)
RBC: 3.22 MIL/uL — ABNORMAL LOW (ref 3.87–5.11)
RDW: 13.6 % (ref 11.5–15.5)
WBC: 7.1 10*3/uL (ref 4.0–10.5)
nRBC: 0 % (ref 0.0–0.2)

## 2020-01-23 LAB — I-STAT CHEM 8, ED
BUN: 54 mg/dL — ABNORMAL HIGH (ref 8–23)
Calcium, Ion: 1.3 mmol/L (ref 1.15–1.40)
Chloride: 112 mmol/L — ABNORMAL HIGH (ref 98–111)
Creatinine, Ser: 1.1 mg/dL — ABNORMAL HIGH (ref 0.44–1.00)
Glucose, Bld: 86 mg/dL (ref 70–99)
HCT: 29 % — ABNORMAL LOW (ref 36.0–46.0)
Hemoglobin: 9.9 g/dL — ABNORMAL LOW (ref 12.0–15.0)
Potassium: 4.2 mmol/L (ref 3.5–5.1)
Sodium: 145 mmol/L (ref 135–145)
TCO2: 21 mmol/L — ABNORMAL LOW (ref 22–32)

## 2020-01-23 LAB — PROTIME-INR
INR: 1 (ref 0.8–1.2)
Prothrombin Time: 13.3 seconds (ref 11.4–15.2)

## 2020-01-23 LAB — RESPIRATORY PANEL BY RT PCR (FLU A&B, COVID)
Influenza A by PCR: NEGATIVE
Influenza B by PCR: NEGATIVE
SARS Coronavirus 2 by RT PCR: NEGATIVE

## 2020-01-23 MED ORDER — MORPHINE SULFATE (PF) 2 MG/ML IV SOLN
2.0000 mg | INTRAVENOUS | Status: DC | PRN
  Administered 2020-01-23 – 2020-01-25 (×6): 2 mg via INTRAVENOUS
  Filled 2020-01-23 (×6): qty 1

## 2020-01-23 MED ORDER — SODIUM CHLORIDE 0.9 % IV SOLN
8.0000 mg/h | INTRAVENOUS | Status: DC
  Administered 2020-01-23 – 2020-01-25 (×4): 8 mg/h via INTRAVENOUS
  Filled 2020-01-23 (×8): qty 80

## 2020-01-23 MED ORDER — SODIUM CHLORIDE 0.9 % IV SOLN
80.0000 mg | Freq: Once | INTRAVENOUS | Status: AC
  Administered 2020-01-23: 80 mg via INTRAVENOUS
  Filled 2020-01-23: qty 80

## 2020-01-23 MED ORDER — SODIUM CHLORIDE 0.9 % IV SOLN: INTRAVENOUS | Status: DC

## 2020-01-23 NOTE — ED Triage Notes (Signed)
Per EMS, patient from home, reports N/V with dark vomit and dark stools since last night. Patient takes Plavix. Denies abdominal pain.  20g L AC NS and 4mg  Zo[facility]  BP 142/76 HR 84 96% RA CBG 177

## 2020-01-23 NOTE — Plan of Care (Signed)

## 2020-01-23 NOTE — ED Provider Notes (Signed)
[facility] DEPT Provider Note   CSN: [id number] Arrival date & time: 01/23/20  1726     History Chief Complaint  Patient presents with  . Hematemesis    [name] is a 84 y.o. female.  HPI Patient has history of GI bleed and is taking Plavix.  She did vomit today and had coffee-ground appearance.  She had been experiencing what she thought was reflux during the early hours of the morning.  She then later started having bowel movements that were dark and bloody in appearance.  She denies she is having significant pain.  She has had some episodes of feeling lightheaded, weakness and lightheadedness worsened by position change and exertion.  No syncopal episodes.    Past Medical History:  Diagnosis Date  . Blood transfusion without reported diagnosis   . CKD (chronic kidney disease), stage III   . Hearing loss    pt wears hearing aid in both ears  . Hypertension   . PONV (postoperative nausea and vomiting)    nausea  . Stroke ([place]) 06/2016  . Thyroid disease     Patient Active Problem List   Diagnosis Date Noted  . Hematemesis 01/23/2020  . Hyperlipidemia 08/04/2016  . Acute bacterial endocarditis 08/04/2016  . Confusion   . Embolic stroke involving right posterior cerebral artery ([place])   . E coli bacteremia   . Iron deficiency anemia due to chronic blood loss   . CAP (community acquired pneumonia) 06/13/2016  . Sepsis ([place]) 06/13/2016  . Nausea vomiting and diarrhea 06/13/2016  . Neutropenia ([place]) 06/13/2016  . Anxiety 06/13/2016  . UGIB (upper gastrointestinal bleed) 06/13/2016  . Hypokalemia 06/13/2016  . Acute renal failure superimposed on stage 3 chronic kidney disease ([place])   . Essential hypertension   . Leukopenia   . AKI (acute kidney injury) ([place])   . Dehydration   . Encounter for central line placement   . Difficult intravenous access     Past Surgical History:  Procedure Laterality Date  . ABDOMINAL HYSTERECTOMY      . CHOLECYSTECTOMY N/A 08/13/2015   Procedure: LAPAROSCOPIC CHOLECYSTECTOMY WITH INTRAOPERATIVE CHOLANGIOGRAM;  Surgeon: Jackolyn Confer, MD;  Location: [facility];  Service: General;  Laterality: N/A;  . ESOPHAGOGASTRODUODENOSCOPY N/A 06/14/2016   Procedure: ESOPHAGOGASTRODUODENOSCOPY (EGD);  Surgeon: Carol Ada, MD;  Location: [facility] ENDOSCOPY;  Service: Endoscopy;  Laterality: N/A;  . ESOPHAGOGASTRODUODENOSCOPY (EGD) WITH PROPOFOL N/A 01/24/2020   Procedure: ESOPHAGOGASTRODUODENOSCOPY (EGD) WITH PROPOFOL;  Surgeon: Carol Ada, MD;  Location: [facility] ENDOSCOPY;  Service: Endoscopy;  Laterality: N/A;  . TEE WITHOUT CARDIOVERSION N/A 06/21/2016   Procedure: TRANSESOPHAGEAL ECHOCARDIOGRAM (TEE);  Surgeon: Lelon Perla, MD;  Location: [facility] ENDOSCOPY;  Service: Cardiovascular;  Laterality: N/A;  . THYROIDECTOMY, PARTIAL       OB History   No obstetric history on file.     Family History  Problem Relation Age of Onset  . Stroke Mother   . Hypertension Mother   . Heart attack Father   . Other Daughter        Still born    Social History   Tobacco Use  . Smoking status: Never Smoker  . Smokeless tobacco: Never Used  Substance Use Topics  . Alcohol use: No  . Drug use: No    Home Medications Prior to Admission medications   Medication Sig Start Date End Date Taking? Authorizing Provider  busPIRone (BUSPAR) 5 MG tablet Take 5 mg by mouth 2 (two) times daily. 12/10/19  Yes  [provider]  LORazepam (ATIVAN) 0.5 MG tablet Take 0.5 mg by mouth 2 (two) times daily as needed for anxiety. 01/06/20  Yes [provider]  metoprolol tartrate (LOPRESSOR) 25 MG tablet Take 0.5 tablets (12.5 mg total) by mouth 2 (two) times daily. 01/13/17  Yes Lennette Bihari, MD  sertraline (ZOLOFT) 25 MG tablet Take 25 mg by mouth daily. 10/29/19  Yes [provider]  simvastatin (ZOCOR) 20 MG tablet TAKE 1 TABLET EVERY DAY AT BEDTIME Patient taking differently: Take 20 mg by mouth daily at 6  PM.  09/15/17  Yes Marvel Plan, MD  valsartan (DIOVAN) 160 MG tablet Take 1 tablet (160 mg total) by mouth daily. NEEDS APPOINTMENT FOR FUTURE REFILLS Patient taking differently: Take 160 mg by mouth daily.  04/01/19  Yes Lennette Bihari, MD  venlafaxine XR (EFFEXOR-XR) 150 MG 24 hr capsule Take 150 mg by mouth daily. 01/05/20  Yes [provider]  clopidogrel (PLAVIX) 75 MG tablet Restart Plavix 01/28/2020 01/25/20   Alwyn Ren, MD  gabapentin (NEURONTIN) 100 MG capsule Take 1 capsule (100 mg total) by mouth 2 (two) times daily. 01/25/20   Alwyn Ren, MD  ondansetron (ZOFRAN) 4 MG tablet Take 1 tablet (4 mg total) by mouth every 6 (six) hours as needed for nausea. 01/25/20   Alwyn Ren, MD  pantoprazole (PROTONIX) 40 MG tablet Take 1 tablet (40 mg total) by mouth daily. 01/25/20 01/24/21  Alwyn Ren, MD    Allergies    Amoxicillin  Review of Systems   Review of Systems 10 Systems reviewed and are negative for acute change except as noted in the HPI. Physical Exam Updated Vital Signs BP (!) 144/60 (BP Location: Right Arm)   Pulse 71   Temp 98.2 F (36.8 C) (Oral)   Resp 17   Ht 5' (1.524 m)   Wt 54.4 kg   LMP  (LMP Unknown)   SpO2 95%   BMI 23.44 kg/m   Physical Exam Constitutional:      Comments: Patient is alert and nontoxic.  Slightly pale in appearance.  No respiratory distress.  HENT:     Head: Normocephalic and atraumatic.     Mouth/Throat:     Pharynx: Oropharynx is clear.  Eyes:     Extraocular Movements: Extraocular movements intact.     Conjunctiva/sclera: Conjunctivae normal.  Cardiovascular:     Rate and Rhythm: Normal rate and regular rhythm.  Pulmonary:     Effort: Pulmonary effort is normal.     Breath sounds: Normal breath sounds.  Abdominal:     General: There is no distension.     Palpations: Abdomen is soft.     Tenderness: There is no abdominal tenderness. There is no guarding.  Genitourinary:    Comments:  Melena in the rectal vault. Musculoskeletal:        General: Normal range of motion.     Right lower leg: No edema.     Left lower leg: No edema.  Skin:    General: Skin is warm and dry.     Coloration: Skin is pale.  Neurological:     General: No focal deficit present.     Mental Status: She is oriented to person, place, and time.     Coordination: Coordination normal.  Psychiatric:        Mood and Affect: Mood normal.     ED Results / Procedures / Treatments   Labs (all labs ordered are listed,  but only abnormal results are displayed) Labs Reviewed  COMPREHENSIVE METABOLIC PANEL - Abnormal; Notable for the following components:      Result Value   Chloride 114 (*)    BUN 62 (*)    Creatinine, Ser 1.20 (*)    Total Protein 6.2 (*)    AST 13 (*)    GFR calc non Af Amer 42 (*)    GFR calc Af Amer 49 (*)    All other components within normal limits  CBC WITH DIFFERENTIAL/PLATELET - Abnormal; Notable for the following components:   RBC 3.22 (*)    Hemoglobin 9.6 (*)    HCT 30.8 (*)    All other components within normal limits  COMPREHENSIVE METABOLIC PANEL - Abnormal; Notable for the following components:   Sodium 146 (*)    Chloride 119 (*)    CO2 20 (*)    BUN 48 (*)    Creatinine, Ser 1.11 (*)    Calcium 8.7 (*)    Total Protein 5.4 (*)    Albumin 3.1 (*)    AST 13 (*)    GFR calc non Af Amer 46 (*)    GFR calc Af Amer 54 (*)    All other components within normal limits  CBC - Abnormal; Notable for the following components:   RBC 2.58 (*)    Hemoglobin 7.9 (*)    HCT 25.4 (*)    All other components within normal limits  CBC - Abnormal; Notable for the following components:   RBC 2.82 (*)    Hemoglobin 8.7 (*)    HCT 28.9 (*)    MCV 102.5 (*)    All other components within normal limits  CBC - Abnormal; Notable for the following components:   RBC 3.14 (*)    Hemoglobin 9.6 (*)    HCT 30.1 (*)    All other components within normal limits  CBC -  Abnormal; Notable for the following components:   RBC 3.15 (*)    Hemoglobin 9.5 (*)    HCT 29.9 (*)    RDW 15.6 (*)    All other components within normal limits  COMPREHENSIVE METABOLIC PANEL - Abnormal; Notable for the following components:   Chloride 116 (*)    CO2 21 (*)    BUN 26 (*)    Calcium 8.5 (*)    Total Protein 5.5 (*)    Albumin 3.1 (*)    AST 13 (*)    GFR calc non Af Amer 60 (*)    All other components within normal limits  POC OCCULT BLOOD, ED - Abnormal; Notable for the following components:   Fecal Occult Bld POSITIVE (*)    All other components within normal limits  I-STAT CHEM 8, ED - Abnormal; Notable for the following components:   Chloride 112 (*)    BUN 54 (*)    Creatinine, Ser 1.10 (*)    TCO2 21 (*)    Hemoglobin 9.9 (*)    HCT 29.0 (*)    All other components within normal limits  RESPIRATORY PANEL BY RT PCR (FLU A&B, COVID)  PROTIME-INR  TYPE AND SCREEN  ABO/RH  PREPARE RBC (CROSSMATCH)    EKG None  Radiology No results found.  Procedures Procedures (including critical care time) CRITICAL CARE Performed by: Arby Barrette   Total critical care time: 60 minutes  Critical care time was exclusive of separately billable procedures and treating other patients.  Critical care was necessary to treat or  prevent imminent or life-threatening deterioration.  Critical care was time spent personally by me on the following activities: development of treatment plan with patient and/or surrogate as well as nursing, discussions with consultants, evaluation of patient's response to treatment, examination of patient, obtaining history from patient or surrogate, ordering and performing treatments and interventions, ordering and review of laboratory studies, ordering and review of radiographic studies, pulse oximetry and re-evaluation of patient's condition. Medications Ordered in ED Medications  pantoprazole (PROTONIX) 80 mg in sodium chloride 0.9 %  100 mL IVPB (0 mg Intravenous Stopped 01/23/20 1921)  0.9 %  sodium chloride infusion (Manually program via Guardrails IV Fluids) ( Intravenous Stopped 01/24/20 2038)    ED Course  I have reviewed the triage vital signs and the nursing notes.  Pertinent labs & imaging results that were available during my care of the patient were reviewed by me and considered in my medical decision making (see chart for details).  Clinical Course as of Jan 28 20  Thu Jan 23, 2020  2009 Consult: Dr. Mikeal Hawthorne for admission   [MP]    Clinical Course User Index [MP] Arby Barrette, MD   MDM Rules/Calculators/A&P                      Patient presents with abrupt onset of vomiting consistent with blood as well as melanotic stool.  Patient is anticoagulated on Plavix.  She is symptomatic with some generalized weakness with movement and position change.  Patient will be admitted on IV Protonix bolus and drip.  Patient has symptomatic anemia.  Will plan for admission. Final Clinical Impression(s) / ED Diagnoses Final diagnoses:  GI bleed    Rx / DC Orders ED Discharge Orders         Ordered    ondansetron (ZOFRAN) 4 MG tablet  Every 6 hours PRN     01/25/20 1040    pantoprazole (PROTONIX) 40 MG tablet  Daily     01/25/20 1040    clopidogrel (PLAVIX) 75 MG tablet    Note to Pharmacy: Future refills through PCP   01/25/20 1040    gabapentin (NEURONTIN) 100 MG capsule  2 times daily     01/25/20 1040    Increase activity slowly     01/25/20 1528    Diet - low sodium heart healthy     01/25/20 1528           Arby Barrette, MD 01/28/20 0023

## 2020-01-23 NOTE — ED Notes (Signed)
ED TO INPATIENT HANDOFF REPORT  Name/Age/Gender [name] 84 y.o. female  Code Status Code Status History    Date Active Date Inactive Code Status Order ID Comments User Context   06/13/2016 0606 06/21/2016 2028 Full Code [id number]  Lorretta Harp, MD ED   Advance Care Planning Activity      Home/SNF/Other Home  Chief Complaint Hematemesis [K92.0]  Level of Care/Admitting Diagnosis ED Disposition    ED Disposition Condition Comment   Admit  Hospital Area: [facility] [[id number]]  Level of Care: Telemetry [5]  Admit to tele based on following criteria: Complex arrhythmia (Bradycardia/Tachycardia)  May admit patient to [facility] or [facility] if equivalent level of care is available:: No  Covid Evaluation: Asymptomatic Screening Protocol (No Symptoms)  Diagnosis: Hematemesis [578.0.ICD-9-CM]  Admitting Physician: Rometta Emery [[id number]]  Attending Physician: Rometta Emery [[id number]]  Estimated length of stay: past midnight tomorrow  Certification:: I certify this patient will need inpatient services for at least 2 midnights       Medical History Past Medical History:  Diagnosis Date  . Blood transfusion without reported diagnosis   . CKD (chronic kidney disease), stage III   . Hearing loss    pt wears hearing aid in both ears  . Hypertension   . PONV (postoperative nausea and vomiting)    nausea  . Stroke ([facility]) 06/2016  . Thyroid disease     Allergies Allergies  Allergen Reactions  . Amoxicillin Hives and Itching    Has patient had a PCN reaction causing immediate rash, facial/tongue/throat swelling, SOB or lightheadedness with hypotension: Yes Has patient had a PCN reaction causing severe rash involving mucus membranes or skin necrosis: Yes Has patient had a PCN reaction that required hospitalization No Has patient had a PCN reaction occurring within the last 10 years: Yes If all of the above answers are "NO", then may proceed with  Cephalosporin use.     IV Location/Drains/Wounds Patient Lines/Drains/Airways Status   Active Line/Drains/Airways    Name:   Placement date:   Placement time:   Site:   Days:   Peripheral IV 01/23/20 Left Antecubital   01/23/20    --    Antecubital   less than 1   PICC Single Lumen 06/21/16 Right Basilic 35 cm 0 cm   06/21/16    1454    Basilic   1311   Incision (Closed) 08/13/15 Abdomen Other (Comment)   08/13/15    1400     1624   Incision - 4 Ports Abdomen 1: Umbilicus 2: Right;Medial 3: Right;Lateral 4: Mid;Upper   08/13/15    --     1624          Labs/Imaging Results for orders placed or performed during the hospital encounter of 01/23/20 (from the past 48 hour(s))  Comprehensive metabolic panel     Status: Abnormal   Collection Time: 01/23/20  6:02 PM  Result Value Ref Range   Sodium 143 135 - 145 mmol/L   Potassium 4.2 3.5 - 5.1 mmol/L   Chloride 114 (H) 98 - 111 mmol/L   CO2 23 22 - 32 mmol/L   Glucose, Bld 93 70 - 99 mg/dL    Comment: Glucose reference range applies only to samples taken after fasting for at least 8 hours.   BUN 62 (H) 8 - 23 mg/dL   Creatinine, Ser 1.32 (H) 0.44 - 1.00 mg/dL   Calcium 9.1 8.9 - 44.0 mg/dL   Total  Protein 6.2 (L) 6.5 - 8.1 g/dL   Albumin 3.5 3.5 - 5.0 g/dL   AST 13 (L) 15 - 41 U/L   ALT 11 0 - 44 U/L   Alkaline Phosphatase 57 38 - 126 U/L   Total Bilirubin 0.7 0.3 - 1.2 mg/dL   GFR calc non Af Amer 42 (L) >60 mL/min   GFR calc Af Amer 49 (L) >60 mL/min   Anion gap 6 5 - 15    Comment: Performed at [facility], [place] [address], [place], [place] [address]  CBC WITH DIFFERENTIAL     Status: Abnormal   Collection Time: 01/23/20  6:02 PM  Result Value Ref Range   WBC 7.1 4.0 - 10.5 K/uL   RBC 3.22 (L) 3.87 - 5.11 MIL/uL   Hemoglobin 9.6 (L) 12.0 - 15.0 g/dL   HCT 30.8 (L) 36.0 - 46.0 %   MCV 95.7 80.0 - 100.0 fL   MCH 29.8 26.0 - 34.0 pg   MCHC 31.2 30.0 - 36.0 g/dL   RDW 13.6 11.5 - 15.5 %   Platelets 266  150 - 400 K/uL   nRBC 0.0 0.0 - 0.2 %   Neutrophils Relative % 76 %   Neutro Abs 5.3 1.7 - 7.7 K/uL   Lymphocytes Relative 16 %   Lymphs Abs 1.1 0.7 - 4.0 K/uL   Monocytes Relative 7 %   Monocytes Absolute 0.5 0.1 - 1.0 K/uL   Eosinophils Relative 0 %   Eosinophils Absolute 0.0 0.0 - 0.5 K/uL   Basophils Relative 1 %   Basophils Absolute 0.1 0.0 - 0.1 K/uL   Immature Granulocytes 0 %   Abs Immature Granulocytes 0.01 0.00 - 0.07 K/uL    Comment: Performed at [facility], [place] [address], [place], [place] [address]  Protime-INR     Status: None   Collection Time: 01/23/20  6:02 PM  Result Value Ref Range   Prothrombin Time 13.3 11.4 - 15.2 seconds   INR 1.0 0.8 - 1.2    Comment: (NOTE) INR goal varies based on device and disease states. Performed at [facility], [place] [address] [place], [place] [address]   Type and screen [place]     Status: None   Collection Time: 01/23/20  7:00 PM  Result Value Ref Range   ABO/RH(D) O POS    Antibody Screen NEG    Sample Expiration      01/26/2020,2359 Performed at [facility], [place] [address], [place], [place] [address]   POC occult blood, ED Provider will collect     Status: Abnormal   Collection Time: 01/23/20  7:10 PM  Result Value Ref Range   Fecal Occult Bld POSITIVE (A) NEGATIVE  I-Stat Chem 8, ED     Status: Abnormal   Collection Time: 01/23/20  7:10 PM  Result Value Ref Range   Sodium 145 135 - 145 mmol/L   Potassium 4.2 3.5 - 5.1 mmol/L   Chloride 112 (H) 98 - 111 mmol/L   BUN 54 (H) 8 - 23 mg/dL   Creatinine, Ser 1.10 (H) 0.44 - 1.00 mg/dL   Glucose, Bld 86 70 - 99 mg/dL    Comment: Glucose reference range applies only to samples taken after fasting for at least 8 hours.   Calcium, Ion 1.30 1.15 - 1.40 mmol/L   TCO2 21 (L) 22 - 32 mmol/L   Hemoglobin 9.9 (L) 12.0 - 15.0 g/dL  HCT 29.0 (L) 36.0 - 46.0 %   DG Chest Port 1 View  Result  Date: 01/23/2020 CLINICAL DATA:  Nausea, vomiting, dark emesis and stool since last night, denies abdominal pain EXAM: PORTABLE CHEST 1 VIEW COMPARISON:  Radiograph 06/17/2016, CT 07/30/2008 FINDINGS: Chronic hyperinflation of the lungs with some coarsened reticular interstitial opacities are similar to comparison examinations. No consolidation, convincing features of edema, pneumothorax or effusion. The aorta is calcified. The remaining cardiomediastinal contours are unremarkable. Surgical clips at the base of the right neck likely reflect partial thyroidectomy. No acute osseous or soft tissue abnormality. Telemetry leads overlie the chest. IMPRESSION: 1. No acute cardiopulmonary abnormality. 2. Stable chronic hyperinflation and coarsened reticular interstitial opacities. Electronically Signed   By: Kreg Shropshire M.D.   On: 01/23/2020 19:05    Pending Labs Unresulted Labs (From admission, onward)    Start     Ordered   01/23/20 2216  Respiratory Panel by RT PCR (Flu A&B, Covid) - Nasopharyngeal Swab  (Tier 2 Respiratory Panel by RT PCR (Flu A&B, Covid) (TAT 2 hrs))  Once,   STAT    Question Answer Comment  Is this test for diagnosis or screening Screening   Symptomatic for COVID-19 as defined by CDC No   Hospitalized for COVID-19 No   Admitted to ICU for COVID-19 No   Previously tested for COVID-19 No   Resident in a congregate (group) care setting No   Employed in healthcare setting No   Pregnant No   Has patient completed COVID vaccination(s) (2 doses of Pfizer/Moderna 1 dose of [employer]) No      01/23/20 2215   01/23/20 1900  ABO/Rh  Once,   R     01/23/20 1900   Signed and Gross  Comprehensive metabolic panel  Tomorrow morning,   R     Signed and Gross   Signed and Gross  CBC  Now then every 6 hours,   R     Signed and Gross          Vitals/Pain Today's Vitals   01/23/20 1930 01/23/20 2000 01/23/20 2030 01/23/20 2130  BP: (!) 143/62 (!) 153/69 (!) 160/67 (!) 149/68  Pulse:  74 80 83 83  Resp: 15 16 12 18   Temp:      TempSrc:      SpO2: 100% 97% 99% 97%  PainSc:        Isolation Precautions No active isolations  Medications Medications  0.9 %  sodium chloride infusion ( Intravenous New Bag/Given (Non-Interop) 01/23/20 1901)  pantoprazole (PROTONIX) 80 mg in sodium chloride 0.9 % 100 mL (0.8 mg/mL) infusion (8 mg/hr Intravenous New Bag/Given (Non-Interop) 01/23/20 1950)  morphine 2 MG/ML injection 2 mg (2 mg Intravenous Given 01/23/20 2152)  pantoprazole (PROTONIX) 80 mg in sodium chloride 0.9 % 100 mL IVPB (0 mg Intravenous Stopped 01/23/20 1921)    Mobility walks

## 2020-01-23 NOTE — H&P (Signed)
History and Physical   [name] KGY:[id number] DOB: [birth year] DOA: 01/23/2020  Referring MD/NP/PA: Dr. Johnney Killian  PCP: Maurice Small, MD   Outpatient Specialists: Dr. Carol Ada, gastroenterology  Patient coming from: Home  Chief Complaint: Vomiting blood  HPI: [name] is a 84 y.o. female with medical history significant of recurrent GI bleed, history of CVA on Plavix, chronic kidney disease stage III, hypertension, hypothyroidism, who presented to the ER with an episode of vomiting today.  She vomited blood apparently which is coffee-ground.  Patient has had previous GI bleeds and has had EGDs and colonoscopies.  She previously received blood transfusion secondary to GI bleed.  She presented to the ER after 1 episode tonight.  She also noticed melena since last night.  Hemoglobin has decreased from her last known hemoglobin of 12.9 in 2018 with today 9.6.  She is slightly dizzy but otherwise no other significant complaint.  Patient has been taking her Plavix because of her previous CVA and wants to continue taking her Plavix.  At this point she is being admitted to the hospital with upper GI bleed for evaluation by GI and management..  ED Course: Temperature 97.5 blood pressure 170/67, pulse 84 respirate of 19 oxygen sat 96% room air.  White count 7.1 hemoglobin 9.9 with platelet 266.  Sodium 145 potassium 4.2 chloride 112 CO2 23 BUN 54 and creatinine 1.1 calcium 9.1.  INR is 1.0 glucose 86.  COVID-19 screen is negative.  Fecal occult blood testing positive x2.  Patient initiated on IV Protonix and being admitted for treatment.  Review of Systems: As per HPI otherwise 10 point review of systems negative.    Past Medical History:  Diagnosis Date  . Blood transfusion without reported diagnosis   . CKD (chronic kidney disease), stage III   . Hearing loss    pt wears hearing aid in both ears  . Hypertension   . PONV (postoperative nausea and vomiting)    nausea  . Stroke ([place])  06/2016  . Thyroid disease     Past Surgical History:  Procedure Laterality Date  . ABDOMINAL HYSTERECTOMY    . CHOLECYSTECTOMY N/A 08/13/2015   Procedure: LAPAROSCOPIC CHOLECYSTECTOMY WITH INTRAOPERATIVE CHOLANGIOGRAM;  Surgeon: Jackolyn Confer, MD;  Location: [facility];  Service: General;  Laterality: N/A;  . ESOPHAGOGASTRODUODENOSCOPY N/A 06/14/2016   Procedure: ESOPHAGOGASTRODUODENOSCOPY (EGD);  Surgeon: Carol Ada, MD;  Location: [facility] ENDOSCOPY;  Service: Endoscopy;  Laterality: N/A;  . TEE WITHOUT CARDIOVERSION N/A 06/21/2016   Procedure: TRANSESOPHAGEAL ECHOCARDIOGRAM (TEE);  Surgeon: Lelon Perla, MD;  Location: [facility] ENDOSCOPY;  Service: Cardiovascular;  Laterality: N/A;  . THYROIDECTOMY, PARTIAL       reports that she has never smoked. She has never used smokeless tobacco. She reports that she does not drink alcohol or use drugs.  Allergies  Allergen Reactions  . Amoxicillin Hives and Itching    Has patient had a PCN reaction causing immediate rash, facial/tongue/throat swelling, SOB or lightheadedness with hypotension: Yes Has patient had a PCN reaction causing severe rash involving mucus membranes or skin necrosis: Yes Has patient had a PCN reaction that required hospitalization No Has patient had a PCN reaction occurring within the last 10 years: Yes If all of the above answers are "NO", then may proceed with Cephalosporin use.     Family History  Problem Relation Age of Onset  . Stroke Mother   . Hypertension Mother   . Heart attack Father   . Other Daughter  Still born     Prior to Admission medications   Medication Sig Start Date End Date Taking? Authorizing Provider  amLODipine (NORVASC) 10 MG tablet Take 10 mg by mouth daily. 01/06/20  Yes [provider]  busPIRone (BUSPAR) 5 MG tablet Take 5 mg by mouth 2 (two) times daily. 12/10/19  Yes [provider]  clopidogrel (PLAVIX) 75 MG tablet take 1 tablet by mouth once daily Patient taking  differently: Take 75 mg by mouth daily.  12/16/16  Yes Marvel PlanXu, Jindong, MD  escitalopram (LEXAPRO) 5 MG tablet Take 1 tablet by mouth daily. 01/02/17  Yes [provider]  gabapentin (NEURONTIN) 100 MG capsule Take 100 mg by mouth 3 (three) times daily. 01/06/20  Yes [provider]  LORazepam (ATIVAN) 0.5 MG tablet Take 0.5 mg by mouth 2 (two) times daily as needed for anxiety. 01/06/20  Yes [provider]  meloxicam (MOBIC) 15 MG tablet Take 15 mg by mouth every morning. 12/29/19  Yes [provider]  metoprolol tartrate (LOPRESSOR) 25 MG tablet Take 0.5 tablets (12.5 mg total) by mouth 2 (two) times daily. 01/13/17  Yes Lennette BihariKelly, Thomas A, MD  sertraline (ZOLOFT) 25 MG tablet Take 25 mg by mouth daily. 10/29/19  Yes [provider]  simvastatin (ZOCOR) 20 MG tablet TAKE 1 TABLET EVERY DAY AT BEDTIME Patient taking differently: Take 20 mg by mouth daily at 6 PM.  09/15/17  Yes Marvel PlanXu, Jindong, MD  valsartan (DIOVAN) 160 MG tablet Take 1 tablet (160 mg total) by mouth daily. NEEDS APPOINTMENT FOR FUTURE REFILLS Patient taking differently: Take 160 mg by mouth daily.  04/01/19  Yes Lennette BihariKelly, Thomas A, MD  venlafaxine XR (EFFEXOR-XR) 150 MG 24 hr capsule Take 150 mg by mouth daily. 01/05/20  Yes [provider]    Physical Exam: Vitals:   01/23/20 2030 01/23/20 2130 01/23/20 2333 01/23/20 2338  BP: (!) 160/67 (!) 149/68 (!) 170/67   Pulse: 83 83 79   Resp: 12 18 16    Temp:   98.8 F (37.1 C)   TempSrc:   Oral   SpO2: 99% 97% 96%   Weight:    54.4 kg  Height:    5' (1.524 m)      Constitutional: Stable, frail, no acute distress Vitals:   01/23/20 2030 01/23/20 2130 01/23/20 2333 01/23/20 2338  BP: (!) 160/67 (!) 149/68 (!) 170/67   Pulse: 83 83 79   Resp: 12 18 16    Temp:   98.8 F (37.1 C)   TempSrc:   Oral   SpO2: 99% 97% 96%   Weight:    54.4 kg  Height:    5' (1.524 m)   Eyes: PERRL, lids and conjunctivae pale ENMT: Mucous membranes are moist.  Posterior pharynx clear of any exudate or lesions.Normal dentition.  Neck: normal, supple, no masses, no thyromegaly Respiratory: clear to auscultation bilaterally, no wheezing, no crackles. Normal respiratory effort. No accessory muscle use.  Cardiovascular: Regular rate and rhythm, no murmurs / rubs / gallops. No extremity edema. 2+ pedal pulses. No carotid bruits.  Abdomen: Mild epigastric tenderness no masses palpated. No hepatosplenomegaly. Bowel sounds positive.  Musculoskeletal: no clubbing / cyanosis. No joint deformity upper and lower extremities. Good ROM, no contractures. Normal muscle tone.  Skin: no rashes, lesions, ulcers. No induration Neurologic: CN 2-12 grossly intact. Sensation intact, DTR normal. Strength 5/5 in all 4.  Psychiatric: Normal judgment and insight. Alert and oriented x 3. Normal mood.     Labs  on Admission: I have personally reviewed following labs and imaging studies  CBC: Recent Labs  Lab 01/23/20 1802 01/23/20 1910  WBC 7.1  --   NEUTROABS 5.3  --   HGB 9.6* 9.9*  HCT 30.8* 29.0*  MCV 95.7  --   PLT 266  --    Basic Metabolic Panel: Recent Labs  Lab 01/23/20 1802 01/23/20 1910  NA 143 145  K 4.2 4.2  CL 114* 112*  CO2 23  --   GLUCOSE 93 86  BUN 62* 54*  CREATININE 1.20* 1.10*  CALCIUM 9.1  --    GFR: Estimated Creatinine Clearance: 28.3 mL/min (A) (by C-G formula based on SCr of 1.1 mg/dL (H)). Liver Function Tests: Recent Labs  Lab 01/23/20 1802  AST 13*  ALT 11  ALKPHOS 57  BILITOT 0.7  PROT 6.2*  ALBUMIN 3.5   No results for input(s): LIPASE, AMYLASE in the last 168 hours. No results for input(s): AMMONIA in the last 168 hours. Coagulation Profile: Recent Labs  Lab 01/23/20 1802  INR 1.0   Cardiac Enzymes: No results for input(s): CKTOTAL, CKMB, CKMBINDEX, TROPONINI in the last 168 hours. BNP (last 3 results) No results for input(s): PROBNP in the last 8760 hours. HbA1C: No results for input(s): HGBA1C in the  last 72 hours. CBG: No results for input(s): GLUCAP in the last 168 hours. Lipid Profile: No results for input(s): CHOL, HDL, LDLCALC, TRIG, CHOLHDL, LDLDIRECT in the last 72 hours. Thyroid Function Tests: No results for input(s): TSH, T4TOTAL, FREET4, T3FREE, THYROIDAB in the last 72 hours. Anemia Panel: No results for input(s): VITAMINB12, FOLATE, FERRITIN, TIBC, IRON, RETICCTPCT in the last 72 hours. Urine analysis:    Component Value Date/Time   COLORURINE YELLOW 12/15/2016 1932   APPEARANCEUR CLEAR 12/15/2016 1932   LABSPEC 1.014 12/15/2016 1932   PHURINE 6.0 12/15/2016 1932   GLUCOSEU NEGATIVE 12/15/2016 1932   HGBUR NEGATIVE 12/15/2016 1932   BILIRUBINUR NEGATIVE 12/15/2016 1932   KETONESUR NEGATIVE 12/15/2016 1932   PROTEINUR NEGATIVE 12/15/2016 1932   UROBILINOGEN 0.2 12/08/2009 1141   NITRITE NEGATIVE 12/15/2016 1932   LEUKOCYTESUR NEGATIVE 12/15/2016 1932   Sepsis Labs: @LABRCNTIP (procalcitonin:4,lacticidven:4) ) Recent Results (from the past 240 hour(s))  Respiratory Panel by RT PCR (Flu A&B, Covid) - Nasopharyngeal Swab     Status: None   Collection Time: 01/23/20 10:16 PM   Specimen: Nasopharyngeal Swab  Result Value Ref Range Status   SARS Coronavirus 2 by RT PCR NEGATIVE NEGATIVE Final    Comment: (NOTE) SARS-CoV-2 target nucleic acids are NOT DETECTED. The SARS-CoV-2 RNA is generally detectable in upper respiratoy specimens during the acute phase of infection. The lowest concentration of SARS-CoV-2 viral copies this assay can detect is 131 copies/mL. A negative result does not preclude SARS-Cov-2 infection and should not be used as the sole basis for treatment or other patient management decisions. A negative result may occur with  improper specimen collection/handling, submission of specimen other than nasopharyngeal swab, presence of viral mutation(s) within the areas targeted by this assay, and inadequate number of viral copies (<131 copies/mL). A  negative result must be combined with clinical observations, patient history, and epidemiological information. The expected result is Negative. Fact Sheet for Patients:  01/25/20 Fact Sheet for Healthcare Providers:  https://www.moore.com/ This test is not yet ap proved or cleared by the https://www.young.biz/ FDA and  has been authorized for detection and/or diagnosis of SARS-CoV-2 by FDA under an Emergency Use Authorization (EUA). This EUA will remain  in effect (meaning this test can be used) for the duration of the COVID-19 declaration under Section 564(b)(1) of the Act, 21 U.S.C. section 360bbb-3(b)(1), unless the authorization is terminated or revoked sooner.    Influenza A by PCR NEGATIVE NEGATIVE Final   Influenza B by PCR NEGATIVE NEGATIVE Final    Comment: (NOTE) The Xpert Xpress SARS-CoV-2/FLU/RSV assay is intended as an aid in  the diagnosis of influenza from Nasopharyngeal swab specimens and  should not be used as a sole basis for treatment. Nasal washings and  aspirates are unacceptable for Xpert Xpress SARS-CoV-2/FLU/RSV  testing. Fact Sheet for Patients: https://www.moore.com/ Fact Sheet for Healthcare Providers: https://www.young.biz/ This test is not yet approved or cleared by the [place] FDA and  has been authorized for detection and/or diagnosis of SARS-CoV-2 by  FDA under an Emergency Use Authorization (EUA). This EUA will remain  in effect (meaning this test can be used) for the duration of the  Covid-19 declaration under Section 564(b)(1) of the Act, 21  U.S.C. section 360bbb-3(b)(1), unless the authorization is  terminated or revoked. Performed at [facility], [address] [place], [place] [address]      Radiological Exams on Admission: DG Chest Port 1 View  Result Date: 01/23/2020 CLINICAL DATA:  Nausea, vomiting, dark emesis and stool since  last night, denies abdominal pain EXAM: PORTABLE CHEST 1 VIEW COMPARISON:  Radiograph 06/17/2016, CT 07/30/2008 FINDINGS: Chronic hyperinflation of the lungs with some coarsened reticular interstitial opacities are similar to comparison examinations. No consolidation, convincing features of edema, pneumothorax or effusion. The aorta is calcified. The remaining cardiomediastinal contours are unremarkable. Surgical clips at the base of the right neck likely reflect partial thyroidectomy. No acute osseous or soft tissue abnormality. Telemetry leads overlie the chest. IMPRESSION: 1. No acute cardiopulmonary abnormality. 2. Stable chronic hyperinflation and coarsened reticular interstitial opacities. Electronically Signed   By: Kreg Shropshire M.D.   On: 01/23/2020 19:05      Assessment/Plan Principal Problem:   Hematemesis Active Problems:   Essential hypertension   Anxiety   Dehydration   Hyperlipidemia     #1 hematemesis and melena: Most likely upper GI bleed exacerbated by use of Plavix.  Patient will be admitted.  We will hold her Plavix.  IV Protonix, serial CBCs to check H&H, GI consultation, keep n.p.o. after midnight for possible EGD in the morning.  Avoid NSAIDs.  #2 essential hypertension: We will hold him blood pressure medications.  As needed metoprolol if blood pressure is rising.  #3 history of CVA: Hold medications for now.  Will resume Plavix once GI work-up is completed.  #4 anxiety disorder: Continue counseling.  Resume home medicine as soon as possible.  #5 hyperlipidemia: Resume statin as soon as cleared by GI.   DVT prophylaxis: SCD Code Status: Full code Family Communication: Daughter Disposition Plan: To be determined Consults called: Gastroenterology Dr. [name] Admission status: Inpatient  Severity of Illness: The appropriate patient status for this patient is INPATIENT. Inpatient status is judged to be reasonable and necessary in order to provide the required  intensity of service to ensure the patient's safety. The patient's presenting symptoms, physical exam findings, and initial radiographic and laboratory data in the context of their chronic comorbidities is felt to place them at high risk for further clinical deterioration. Furthermore, it is not anticipated that the patient will be medically stable for discharge from the hospital within 2 midnights of admission. The following factors support the patient status of inpatient.   "  The patient's presenting symptoms include hematemesis and melena. " The worrisome physical exam findings include mild epigastric tenderness. " The initial radiographic and laboratory data are worrisome because of hemoglobin 9.9 and guaiac positive stool. " The chronic co-morbidities include.  History of CVA.   * I certify that at the point of admission it is my clinical judgment that the patient will require inpatient hospital care spanning beyond 2 midnights from the point of admission due to high intensity of service, high risk for further deterioration and high frequency of surveillance required.Lonia Blood MD [facility] Pager [phone]  If 7PM-7AM, please contact night-coverage [email or web address] Password [email or web address]  01/23/2020, 11:49 PM

## 2020-01-24 ENCOUNTER — Encounter (HOSPITAL_COMMUNITY)
Admission: EM | Disposition: A | Discharge status: Home Health | Payer: Self-pay | Source: Home / Self Care | Attending: Internal Medicine

## 2020-01-24 ENCOUNTER — Encounter (HOSPITAL_COMMUNITY): Payer: Self-pay | Admitting: Internal Medicine

## 2020-01-24 ENCOUNTER — Inpatient Hospital Stay (HOSPITAL_COMMUNITY): Payer: BC Managed Care – PPO | Admitting: Anesthesiology

## 2020-01-24 HISTORY — PX: ESOPHAGOGASTRODUODENOSCOPY (EGD) WITH PROPOFOL: SHX5813

## 2020-01-24 LAB — CBC
HCT: 25.4 % — ABNORMAL LOW (ref 36.0–46.0)
HCT: 28.9 % — ABNORMAL LOW (ref 36.0–46.0)
HCT: 30.1 % — ABNORMAL LOW (ref 36.0–46.0)
Hemoglobin: 7.9 g/dL — ABNORMAL LOW (ref 12.0–15.0)
Hemoglobin: 8.7 g/dL — ABNORMAL LOW (ref 12.0–15.0)
Hemoglobin: 9.6 g/dL — ABNORMAL LOW (ref 12.0–15.0)
MCH: 30.6 pg (ref 26.0–34.0)
MCH: 30.9 pg (ref 26.0–34.0)
MCH: 30.6 pg (ref 26.0–34.0)
MCHC: 31.1 g/dL (ref 30.0–36.0)
MCHC: 30.1 g/dL (ref 30.0–36.0)
MCHC: 31.9 g/dL (ref 30.0–36.0)
MCV: 98.4 fL (ref 80.0–100.0)
MCV: 102.5 fL — ABNORMAL HIGH (ref 80.0–100.0)
MCV: 95.9 fL (ref 80.0–100.0)
Platelets: 218 10*3/uL (ref 150–400)
Platelets: 222 10*3/uL (ref 150–400)
Platelets: 220 10*3/uL (ref 150–400)
RBC: 2.58 MIL/uL — ABNORMAL LOW (ref 3.87–5.11)
RBC: 2.82 MIL/uL — ABNORMAL LOW (ref 3.87–5.11)
RBC: 3.14 MIL/uL — ABNORMAL LOW (ref 3.87–5.11)
RDW: 14 % (ref 11.5–15.5)
RDW: 14.1 % (ref 11.5–15.5)
RDW: 15.3 % (ref 11.5–15.5)
WBC: 5.4 10*3/uL (ref 4.0–10.5)
WBC: 5.4 10*3/uL (ref 4.0–10.5)
WBC: 9.6 10*3/uL (ref 4.0–10.5)
nRBC: 0 % (ref 0.0–0.2)
nRBC: 0 % (ref 0.0–0.2)
nRBC: 0 % (ref 0.0–0.2)

## 2020-01-24 LAB — COMPREHENSIVE METABOLIC PANEL
ALT: 10 U/L (ref 0–44)
AST: 13 U/L — ABNORMAL LOW (ref 15–41)
Albumin: 3.1 g/dL — ABNORMAL LOW (ref 3.5–5.0)
Alkaline Phosphatase: 44 U/L (ref 38–126)
Anion gap: 7 (ref 5–15)
BUN: 48 mg/dL — ABNORMAL HIGH (ref 8–23)
CO2: 20 mmol/L — ABNORMAL LOW (ref 22–32)
Calcium: 8.7 mg/dL — ABNORMAL LOW (ref 8.9–10.3)
Chloride: 119 mmol/L — ABNORMAL HIGH (ref 98–111)
Creatinine, Ser: 1.11 mg/dL — ABNORMAL HIGH (ref 0.44–1.00)
GFR calc Af Amer: 54 mL/min — ABNORMAL LOW (ref >60)
GFR calc non Af Amer: 46 mL/min — ABNORMAL LOW (ref >60)
Glucose, Bld: 87 mg/dL (ref 70–99)
Potassium: 3.9 mmol/L (ref 3.5–5.1)
Sodium: 146 mmol/L — ABNORMAL HIGH (ref 135–145)
Total Bilirubin: 0.3 mg/dL (ref 0.3–1.2)
Total Protein: 5.4 g/dL — ABNORMAL LOW (ref 6.5–8.1)

## 2020-01-24 LAB — PREPARE RBC (CROSSMATCH)

## 2020-01-24 LAB — ABO/RH: ABO/RH(D): O POS

## 2020-01-24 SURGERY — ESOPHAGOGASTRODUODENOSCOPY (EGD) WITH PROPOFOL
Anesthesia: Monitor Anesthesia Care

## 2020-01-24 MED ORDER — SODIUM CHLORIDE 0.9 % IV SOLN: INTRAVENOUS | Status: DC

## 2020-01-24 MED ORDER — ALUM & MAG HYDROXIDE-SIMETH 200-200-20 MG/5ML PO SUSP
30.0000 mL | ORAL | Status: DC | PRN
  Administered 2020-01-24: 30 mL via ORAL
  Filled 2020-01-24: qty 30

## 2020-01-24 MED ORDER — LACTATED RINGERS IV SOLN
INTRAVENOUS | Status: DC
  Administered 2020-01-24: 14:00:00 1000 mL via INTRAVENOUS

## 2020-01-24 MED ORDER — ONDANSETRON HCL 4 MG PO TABS
4.0000 mg | ORAL_TABLET | Freq: Four times a day (QID) | ORAL | Status: DC | PRN
  Administered 2020-01-25: 4 mg via ORAL
  Filled 2020-01-24: qty 1

## 2020-01-24 MED ORDER — SODIUM CHLORIDE 0.9% IV SOLUTION: Freq: Once | INTRAVENOUS | Status: AC

## 2020-01-24 MED ORDER — PROPOFOL 10 MG/ML IV BOLUS
INTRAVENOUS | Status: DC | PRN
  Administered 2020-01-24 (×4): 20 mg via INTRAVENOUS
  Administered 2020-01-24: 30 mg via INTRAVENOUS

## 2020-01-24 MED ORDER — ONDANSETRON HCL 4 MG/2ML IJ SOLN: 4.0000 mg | Freq: Four times a day (QID) | INTRAMUSCULAR | Status: DC | PRN

## 2020-01-24 MED ORDER — SODIUM CHLORIDE 0.9 % IV SOLN: 80.0000 mg | Freq: Once | INTRAVENOUS | Status: DC

## 2020-01-24 MED ORDER — SODIUM CHLORIDE 0.9 % IV SOLN: 8.0000 mg/h | INTRAVENOUS | Status: DC

## 2020-01-24 MED ORDER — PANTOPRAZOLE SODIUM 40 MG IV SOLR: 40.0000 mg | Freq: Two times a day (BID) | INTRAVENOUS | Status: DC

## 2020-01-24 MED ORDER — LIDOCAINE 2% (20 MG/ML) 5 ML SYRINGE
INTRAMUSCULAR | Status: DC | PRN
  Administered 2020-01-24: 40 mg via INTRAVENOUS

## 2020-01-24 SURGICAL SUPPLY — 15 items

## 2020-01-24 NOTE — Op Note (Signed)
[facility] Patient Name: [name] Procedure Date: 01/24/2020 MRN: [id number] Attending MD: Jeani Hawking , MD Date of Birth: [birth year] CSN: [id number] Age: 84 Admit Type: Inpatient Procedure:                Upper GI endoscopy Indications:              Melena Providers:                Jeani Hawking, MD, Dwain Sarna, RN, Everardo Pacific,                            Technician, Doreene Burke, CRNA Referring MD:              Medicines:                Propofol per Anesthesia Complications:            No immediate complications. Estimated Blood Loss:     Estimated blood loss: none. Procedure:                Pre-Anesthesia Assessment:                           - Prior to the procedure, a History and Physical                            was performed, and patient medications and                            allergies were reviewed. The patient's tolerance of                            previous anesthesia was also reviewed. The risks                            and benefits of the procedure and the sedation                            options and risks were discussed with the patient.                            All questions were answered, and informed consent                            was obtained. Prior Anticoagulants: The patient has                            taken no previous anticoagulant or antiplatelet                            agents. ASA Grade Assessment: III - A patient with                            severe systemic disease. After reviewing the risks  and benefits, the patient was deemed in                            satisfactory condition to undergo the procedure.                           - Sedation was administered by an anesthesia                            professional. Deep sedation was attained.                           After obtaining informed consent, the endoscope was                            passed under direct vision.  Throughout the                            procedure, the patient's blood pressure, pulse, and                            oxygen saturations were monitored continuously. The                            [id number]-[id number] ([id number]) Olympus gastroscope was                            introduced through the mouth, and advanced to the                            third part of duodenum. The upper GI endoscopy was                            technically difficult and complex. The patient                            tolerated the procedure well. Scope In: Scope Out: Findings:      A 2 cm hiatal hernia was present.      The stomach was normal.      One non-bleeding linear and superficial duodenal ulcer with no stigmata       of bleeding was found in the second portion of the duodenum. The lesion       was 8 mm in largest dimension.      The duodenal bulb did show evidence of a linear superficial ulceration       at the junction of D1 and D2. It was difficult to visualize with the       angulation and the edema. There was a healing linear ulceration proximal       to this area. Impression:               - 2 cm hiatal hernia.                           - Normal stomach.                           -  Non-bleeding duodenal ulcer with no stigmata of                            bleeding.                           - No specimens collected. Moderate Sedation:      Not Applicable - Patient had care per Anesthesia. Recommendation:           - Return patient to hospital ward for ongoing care.                           - Resume regular diet.                           - Continue present medications.                           - PPI QD indefinitely.                           - Resume Plavix in 3 days.                           - Avoid all NSAIDs. Procedure Code(s):        --- Professional ---                           [phone], Esophagogastroduodenoscopy, flexible,                            transoral; diagnostic, including  collection of                            specimen(s) by brushing or washing, when performed                            (separate procedure) Diagnosis Code(s):        --- Professional ---                           K44.9, Diaphragmatic hernia without obstruction or                            gangrene                           K26.9, Duodenal ulcer, unspecified as acute or                            chronic, without hemorrhage or perforation                           K92.1, Melena (includes Hematochezia) CPT copyright 2019 American Medical Association. All rights reserved. The codes documented in this report are preliminary and upon coder review may  be revised to meet current compliance requirements. Jeani Hawking, MD Jeani Hawking, MD 01/24/2020 3:02:06 PM This report has been signed  electronically. Number of Addenda: 0

## 2020-01-24 NOTE — Anesthesia Procedure Notes (Signed)
Date/Time: 01/24/2020 2:31 PM Performed by: Jhonnie Garner, CRNA Oxygen Delivery Method: Simple face mask

## 2020-01-24 NOTE — Progress Notes (Signed)
PROGRESS NOTE    [name]  VPX:[id number] DOB: 08/08/1936 DOA: 01/23/2020 PCP: [name], MD   Chief Complaint  Patient presents with  . Hematemesis    Brief Narrative: 84 year old female with history of GI bleed, stroke on Plavix, stage III CKD, hypothyroidism and hypertension admitted with coffee-ground emesis.  She is followed by Dr. [name] as an outpatient.  ED Course: Temperature 97.5 blood pressure 170/67, pulse 84 respirate of 19 oxygen sat 96% room air.  White count 7.1 hemoglobin 9.9 with platelet 266.  Sodium 145 potassium 4.2 chloride 112 CO2 23 BUN 54 and creatinine 1.1 calcium 9.1.  INR is 1.0 glucose 86.  COVID-19 screen is negative.  Fecal occult blood testing positive x2.  Patient initiated on IV Protonix and being admitted for treatment  Assessment & Plan:   Principal Problem:   Hematemesis Active Problems:   Essential hypertension   Anxiety   Dehydration   Hyperlipidemia   #1ugi bleed-hold plavix.  No further bleed since admission to the hospital.  Continue Protonix.  Serial H&H.  Dr. [name] consulted. Hemoglobin 7.9 down from 9.9. Transfuse 1 unit of packed RBC. Hold Mobic and Plavix.  #2 history of stroke on Plavix which is on hold due to GI bleed  #3 history of hyperlipidemia on statin  #4 anxiety disorder on BuSpar 5 mg daily, Lexapro 5 mg grams daily, Ativan 2.5 mg twice a day, Neurontin, Effexor as an outpatient.   #5 history of essential hypertension blood pressure 118/63, she is intermittently tachycardic at 103. She is on Norvasc 10 mg daily, Lopressor 5 mg twice a day and Diovan 160 mg daily at home.  #6 anxiety/depression on BuSpar 5 mg twice a day, Lexapro 5 mg daily, Ativan 0.5 mg twice a day.   DVT prophylaxis: Lovenox  code Status: Full code  family Communication: Discussed with family disposition:   Status is: Inpatient  Dispo: The patient is from: Home              Anticipated d/c is to: Home              Anticipated d/c date  is: Unknown likely will stay another 1 to 2 days               Patient currently is not medically stable to d/c. Admitted with upper GI bleed plan for EGD today. Getting blood transfusion today.    Consultants: gi  Procedures:none Antimicrobials: none Subjective:  No further vomiting  Objective: Vitals:   01/23/20 2338 01/24/20 0400 01/24/20 0741 01/24/20 1206  BP:  (!) 146/60 (!) 148/60 118/63  Pulse:  73 73 (!) 103  Resp:  16 17   Temp:  97.9 F (36.6 C) 98 F (36.7 C) 98.1 F (36.7 C)  TempSrc:  Oral Oral Oral  SpO2:  95% 99% 100%  Weight: 54.4 kg     Height: 5' (1.524 m)       Intake/Output Summary (Last 24 hours) at 01/24/2020 1225 Last data filed at 01/24/2020 0600 Gross per 24 hour  Intake 1201.65 ml  Output 200 ml  Net 1001.65 ml   Filed Weights   01/23/20 2338  Weight: 54.4 kg    Examination:  General exam: Appears calm and comfortable  Respiratory system: Clear to auscultation. Respiratory effort normal. Cardiovascular system: S1 & S2 heard, RRR. No JVD, murmurs, rubs, gallops or clicks. No pedal edema. Gastrointestinal system: Abdomen is nondistended, soft and nontender. No organomegaly or masses felt. Normal bowel  sounds heard. Central nervous system: Alert and oriented. No focal neurological deficits. Extremities: Symmetric 5 x 5 power. Skin: No rashes, lesions or ulcers Psychiatry: Judgement and insight appear normal. Mood & affect appropriate.     Data Reviewed: I have personally reviewed following labs and imaging studies  CBC: Recent Labs  Lab 01/23/20 1802 01/23/20 1910 01/24/20 0725  WBC 7.1  --  5.4  NEUTROABS 5.3  --   --   HGB 9.6* 9.9* 7.9*  HCT 30.8* 29.0* 25.4*  MCV 95.7  --  98.4  PLT 266  --  218    Basic Metabolic Panel: Recent Labs  Lab 01/23/20 1802 01/23/20 1910 01/24/20 0725  NA 143 145 146*  K 4.2 4.2 3.9  CL 114* 112* 119*  CO2 23  --  20*  GLUCOSE 93 86 87  BUN 62* 54* 48*  CREATININE 1.20* 1.10* 1.11*    CALCIUM 9.1  --  8.7*    GFR: Estimated Creatinine Clearance: 28.1 mL/min (A) (by C-G formula based on SCr of 1.11 mg/dL (H)).  Liver Function Tests: Recent Labs  Lab 01/23/20 1802 01/24/20 0725  AST 13* 13*  ALT 11 10  ALKPHOS 57 44  BILITOT 0.7 0.3  PROT 6.2* 5.4*  ALBUMIN 3.5 3.1*    CBG: No results for input(s): GLUCAP in the last 168 hours.   Recent Results (from the past 240 hour(s))  Respiratory Panel by RT PCR (Flu A&B, Covid) - Nasopharyngeal Swab     Status: None   Collection Time: 01/23/20 10:16 PM   Specimen: Nasopharyngeal Swab  Result Value Ref Range Status   SARS Coronavirus 2 by RT PCR NEGATIVE NEGATIVE Final    Comment: (NOTE) SARS-CoV-2 target nucleic acids are NOT DETECTED. The SARS-CoV-2 RNA is generally detectable in upper respiratoy specimens during the acute phase of infection. The lowest concentration of SARS-CoV-2 viral copies this assay can detect is 131 copies/mL. A negative result does not preclude SARS-Cov-2 infection and should not be used as the sole basis for treatment or other patient management decisions. A negative result may occur with  improper specimen collection/handling, submission of specimen other than nasopharyngeal swab, presence of viral mutation(s) within the areas targeted by this assay, and inadequate number of viral copies (<131 copies/mL). A negative result must be combined with clinical observations, patient history, and epidemiological information. The expected result is Negative. Fact Sheet for Patients:  https://www.moore.com/ Fact Sheet for Healthcare Providers:  https://www.young.biz/ This test is not yet ap proved or cleared by the [place] FDA and  has been authorized for detection and/or diagnosis of SARS-CoV-2 by FDA under an Emergency Use Authorization (EUA). This EUA will remain  in effect (meaning this test can be used) for the duration of the COVID-19  declaration under Section 564(b)(1) of the Act, 21 U.S.C. section 360bbb-3(b)(1), unless the authorization is terminated or revoked sooner.    Influenza A by PCR NEGATIVE NEGATIVE Final   Influenza B by PCR NEGATIVE NEGATIVE Final    Comment: (NOTE) The Xpert Xpress SARS-CoV-2/FLU/RSV assay is intended as an aid in  the diagnosis of influenza from Nasopharyngeal swab specimens and  should not be used as a sole basis for treatment. Nasal washings and  aspirates are unacceptable for Xpert Xpress SARS-CoV-2/FLU/RSV  testing. Fact Sheet for Patients: https://www.moore.com/ Fact Sheet for Healthcare Providers: https://www.young.biz/ This test is not yet approved or cleared by the [place] FDA and  has been authorized for detection and/or diagnosis of SARS-CoV-2 by  FDA under an Emergency Use Authorization (EUA). This EUA will remain  in effect (meaning this test can be used) for the duration of the  Covid-19 declaration under Section 564(b)(1) of the Act, 21  U.S.C. section 360bbb-3(b)(1), unless the authorization is  terminated or revoked. Performed at [facility], [place] [address], [place],  [address]          Radiology Studies: DG Chest Port 1 View  Result Date: 01/23/2020 CLINICAL DATA:  Nausea, vomiting, dark emesis and stool since last night, denies abdominal pain EXAM: PORTABLE CHEST 1 VIEW COMPARISON:  Radiograph 06/17/2016, CT 07/30/2008 FINDINGS: Chronic hyperinflation of the lungs with some coarsened reticular interstitial opacities are similar to comparison examinations. No consolidation, convincing features of edema, pneumothorax or effusion. The aorta is calcified. The remaining cardiomediastinal contours are unremarkable. Surgical clips at the base of the right neck likely reflect partial thyroidectomy. No acute osseous or soft tissue abnormality. Telemetry leads overlie the chest. IMPRESSION: 1. No acute  cardiopulmonary abnormality. 2. Stable chronic hyperinflation and coarsened reticular interstitial opacities. Electronically Signed   By: Lovena Le M.D.   On: 01/23/2020 19:05        Scheduled Meds: . sodium chloride   Intravenous Once  . [START ON 01/26/2020] pantoprazole  40 mg Intravenous Q12H   Continuous Infusions: . sodium chloride 125 mL/hr at 01/24/20 0647  . sodium chloride    . pantoprozole (PROTONIX) infusion 8 mg/hr (01/24/20 0423)     LOS: 1 day     Georgette Shell, MD [facility]   To contact the attending provider between 7A-7P or the covering provider during after hours 7P-7A, please log into the web site [email or web address] and access using universal [place] password for that web site. If you do not have the password, please call the hospital operator.  01/24/2020, 12:25 PM

## 2020-01-24 NOTE — Consult Note (Signed)
Reason for Consult: Coffee-ground emesis Referring Physician: [facility]  Zaidy Held HPI:  This is an 84 year old female with a PMH of NSAID-induced PUD (EGD 06/14/2016), diverticulosis (colonoscopy with Dr. Loreta Ave 12/14/2009), CVA on Plavix, CKD, HTN, and hypothyroidism admitted to the hospital for coffee-ground emesis.  She reported one episode of coffee-ground emesis and on episode of melena.  Her HGB on presentation was 9.6 g/dL and it dropped down to 7.9 g/dL.  She is receiving a unit of PRBC.  The patient does report using Advil "once in a while", which is estimated to be one time every other week.  Recently she did report having problems with GERD and she does report having some epigastric discomfort.  On a very occasional basis, she uses some generic over-the-counter antacid.  Past Medical History:  Diagnosis Date  . Blood transfusion without reported diagnosis   . CKD (chronic kidney disease), stage III   . Hearing loss    pt wears hearing aid in both ears  . Hypertension   . PONV (postoperative nausea and vomiting)    nausea  . Stroke (HCC) 06/2016  . Thyroid disease     Past Surgical History:  Procedure Laterality Date  . ABDOMINAL HYSTERECTOMY    . CHOLECYSTECTOMY N/A 08/13/2015   Procedure: LAPAROSCOPIC CHOLECYSTECTOMY WITH INTRAOPERATIVE CHOLANGIOGRAM;  Surgeon: Avel Peace, MD;  Location: [facility] OR;  Service: General;  Laterality: N/A;  . ESOPHAGOGASTRODUODENOSCOPY N/A 06/14/2016   Procedure: ESOPHAGOGASTRODUODENOSCOPY (EGD);  Surgeon: Jeani Hawking, MD;  Location: [facility] ENDOSCOPY;  Service: Endoscopy;  Laterality: N/A;  . TEE WITHOUT CARDIOVERSION N/A 06/21/2016   Procedure: TRANSESOPHAGEAL ECHOCARDIOGRAM (TEE);  Surgeon: Lewayne Bunting, MD;  Location: [facility] ENDOSCOPY;  Service: Cardiovascular;  Laterality: N/A;  . THYROIDECTOMY, PARTIAL      Family History  Problem Relation Age of Onset  . Stroke Mother   . Hypertension Mother   . Heart attack Father   . Other  Daughter        Still born    Social History:  reports that she has never smoked. She has never used smokeless tobacco. She reports that she does not drink alcohol or use drugs.  Allergies:  Allergies  Allergen Reactions  . Amoxicillin Hives and Itching    Has patient had a PCN reaction causing immediate rash, facial/tongue/throat swelling, SOB or lightheadedness with hypotension: Yes Has patient had a PCN reaction causing severe rash involving mucus membranes or skin necrosis: Yes Has patient had a PCN reaction that required hospitalization No Has patient had a PCN reaction occurring within the last 10 years: Yes If all of the above answers are "NO", then may proceed with Cephalosporin use.     Medications:  Scheduled: . sodium chloride   Intravenous Once  . [START ON 01/26/2020] pantoprazole  40 mg Intravenous Q12H   Continuous: . sodium chloride 125 mL/hr at 01/24/20 0647  . sodium chloride    . pantoprozole (PROTONIX) infusion 8 mg/hr (01/24/20 0423)    Results for orders placed or performed during the hospital encounter of 01/23/20 (from the past 24 hour(s))  Comprehensive metabolic panel     Status: Abnormal   Collection Time: 01/23/20  6:02 PM  Result Value Ref Range   Sodium 143 135 - 145 mmol/L   Potassium 4.2 3.5 - 5.1 mmol/L   Chloride 114 (H) 98 - 111 mmol/L   CO2 23 22 - 32 mmol/L   Glucose, Bld 93 70 - 99 mg/dL   BUN 62 (H)  8 - 23 mg/dL   Creatinine, Ser 7.09 (H) 0.44 - 1.00 mg/dL   Calcium 9.1 8.9 - 62.8 mg/dL   Total Protein 6.2 (L) 6.5 - 8.1 g/dL   Albumin 3.5 3.5 - 5.0 g/dL   AST 13 (L) 15 - 41 U/L   ALT 11 0 - 44 U/L   Alkaline Phosphatase 57 38 - 126 U/L   Total Bilirubin 0.7 0.3 - 1.2 mg/dL   GFR calc non Af Amer 42 (L) >60 mL/min   GFR calc Af Amer 49 (L) >60 mL/min   Anion gap 6 5 - 15  CBC WITH DIFFERENTIAL     Status: Abnormal   Collection Time: 01/23/20  6:02 PM  Result Value Ref Range   WBC 7.1 4.0 - 10.5 K/uL   RBC 3.22 (L) 3.87 - 5.11  MIL/uL   Hemoglobin 9.6 (L) 12.0 - 15.0 g/dL   HCT 36.6 (L) 29.4 - 76.5 %   MCV 95.7 80.0 - 100.0 fL   MCH 29.8 26.0 - 34.0 pg   MCHC 31.2 30.0 - 36.0 g/dL   RDW 46.5 03.5 - 46.5 %   Platelets 266 150 - 400 K/uL   nRBC 0.0 0.0 - 0.2 %   Neutrophils Relative % 76 %   Neutro Abs 5.3 1.7 - 7.7 K/uL   Lymphocytes Relative 16 %   Lymphs Abs 1.1 0.7 - 4.0 K/uL   Monocytes Relative 7 %   Monocytes Absolute 0.5 0.1 - 1.0 K/uL   Eosinophils Relative 0 %   Eosinophils Absolute 0.0 0.0 - 0.5 K/uL   Basophils Relative 1 %   Basophils Absolute 0.1 0.0 - 0.1 K/uL   Immature Granulocytes 0 %   Abs Immature Granulocytes 0.01 0.00 - 0.07 K/uL  Protime-INR     Status: None   Collection Time: 01/23/20  6:02 PM  Result Value Ref Range   Prothrombin Time 13.3 11.4 - 15.2 seconds   INR 1.0 0.8 - 1.2  Type and screen  [facility]     Status: None (Preliminary result)   Collection Time: 01/23/20  7:00 PM  Result Value Ref Range   ABO/RH(D) O POS    Antibody Screen NEG    Sample Expiration      01/26/2020,2359 Performed at [facility], [address], [place], [place] [address]    Unit Number [id number]    Blood Component Type RED CELLS,LR    Unit division 00    Status of Unit ALLOCATED    Transfusion Status OK TO TRANSFUSE    Crossmatch Result Compatible   ABO/Rh     Status: None   Collection Time: 01/23/20  7:00 PM  Result Value Ref Range   ABO/RH(D)      O POS Performed at [facility], [address] [place], [place] [address]   POC occult blood, ED Provider will collect     Status: Abnormal   Collection Time: 01/23/20  7:10 PM  Result Value Ref Range   Fecal Occult Bld POSITIVE (A) NEGATIVE  I-Stat Chem 8, ED     Status: Abnormal   Collection Time: 01/23/20  7:10 PM  Result Value Ref Range   Sodium 145 135 - 145 mmol/L   Potassium 4.2 3.5 - 5.1 mmol/L   Chloride 112 (H) 98 - 111 mmol/L   BUN 54 (H) 8 - 23 mg/dL    Creatinine, Ser 3.84 (H) 0.44 - 1.00 mg/dL   Glucose, Bld 86  70 - 99 mg/dL   Calcium, Ion 1.30 1.15 - 1.40 mmol/L   TCO2 21 (L) 22 - 32 mmol/L   Hemoglobin 9.9 (L) 12.0 - 15.0 g/dL   HCT 29.0 (L) 36.0 - 46.0 %  Respiratory Panel by RT PCR (Flu A&B, Covid) - Nasopharyngeal Swab     Status: None   Collection Time: 01/23/20 10:16 PM   Specimen: Nasopharyngeal Swab  Result Value Ref Range   SARS Coronavirus 2 by RT PCR NEGATIVE NEGATIVE   Influenza A by PCR NEGATIVE NEGATIVE   Influenza B by PCR NEGATIVE NEGATIVE  Comprehensive metabolic panel     Status: Abnormal   Collection Time: 01/24/20  7:25 AM  Result Value Ref Range   Sodium 146 (H) 135 - 145 mmol/L   Potassium 3.9 3.5 - 5.1 mmol/L   Chloride 119 (H) 98 - 111 mmol/L   CO2 20 (L) 22 - 32 mmol/L   Glucose, Bld 87 70 - 99 mg/dL   BUN 48 (H) 8 - 23 mg/dL   Creatinine, Ser 1.11 (H) 0.44 - 1.00 mg/dL   Calcium 8.7 (L) 8.9 - 10.3 mg/dL   Total Protein 5.4 (L) 6.5 - 8.1 g/dL   Albumin 3.1 (L) 3.5 - 5.0 g/dL   AST 13 (L) 15 - 41 U/L   ALT 10 0 - 44 U/L   Alkaline Phosphatase 44 38 - 126 U/L   Total Bilirubin 0.3 0.3 - 1.2 mg/dL   GFR calc non Af Amer 46 (L) >60 mL/min   GFR calc Af Amer 54 (L) >60 mL/min   Anion gap 7 5 - 15  CBC     Status: Abnormal   Collection Time: 01/24/20  7:25 AM  Result Value Ref Range   WBC 5.4 4.0 - 10.5 K/uL   RBC 2.58 (L) 3.87 - 5.11 MIL/uL   Hemoglobin 7.9 (L) 12.0 - 15.0 g/dL   HCT 25.4 (L) 36.0 - 46.0 %   MCV 98.4 80.0 - 100.0 fL   MCH 30.6 26.0 - 34.0 pg   MCHC 31.1 30.0 - 36.0 g/dL   RDW 14.0 11.5 - 15.5 %   Platelets 218 150 - 400 K/uL   nRBC 0.0 0.0 - 0.2 %  Prepare RBC (crossmatch)     Status: None   Collection Time: 01/24/20 10:04 AM  Result Value Ref Range   Order Confirmation      ORDER PROCESSED BY BLOOD BANK Performed at [facility], [address] [place], [place] [address]      DG Chest Port 1 View  Result Date: 01/23/2020 CLINICAL DATA:  Nausea,  vomiting, dark emesis and stool since last night, denies abdominal pain EXAM: PORTABLE CHEST 1 VIEW COMPARISON:  Radiograph 06/17/2016, CT 07/30/2008 FINDINGS: Chronic hyperinflation of the lungs with some coarsened reticular interstitial opacities are similar to comparison examinations. No consolidation, convincing features of edema, pneumothorax or effusion. The aorta is calcified. The remaining cardiomediastinal contours are unremarkable. Surgical clips at the base of the right neck likely reflect partial thyroidectomy. No acute osseous or soft tissue abnormality. Telemetry leads overlie the chest. IMPRESSION: 1. No acute cardiopulmonary abnormality. 2. Stable chronic hyperinflation and coarsened reticular interstitial opacities. Electronically Signed   By: Lovena Le M.D.   On: 01/23/2020 19:05    ROS:  As stated above in the HPI otherwise negative.  Blood pressure 118/63, pulse (!) 103, temperature 98.1 F (36.7 C), temperature source Oral, resp. rate 17, height 5' (1.524 m), weight 54.4  kg, SpO2 100 %.    PE: Gen: NAD, Alert and Oriented HEENT:  [place]/AT, EOMI Neck: Supple, no LAD Lungs: CTA Bilaterally CV: RRR without M/G/R ABM: Soft, NTND, +BS Ext: No C/C/E  Assessment/Plan: 1) Upper GI bleed. 2) History of NSAID-induced ulcerations in 2017. 3) Anemia.   The patient is hemodynamically stable.  Further evaluation with an EGD will be performed today.  Plan: 1) EGD now.  Novella Abraha D 01/24/2020, 12:23 PM

## 2020-01-24 NOTE — Progress Notes (Signed)
This note also relates to the following rows which could not be included: ECG Heart Rate - Cannot attach notes to unvalidated device data Resp - Cannot attach notes to unvalidated device data    01/24/20 1003  Provider Notification  Provider Name/Title Jerolyn Center  Date Provider Notified 01/24/20  Time Provider Notified 1000  Notification Type Page  Notification Reason Requested by patient/family (asking for update while on unit )  Response Other (Comment) (MD will come back and speak with pt's family )  Date of Provider Response 01/24/20  Time of Provider Response 1003

## 2020-01-24 NOTE — Anesthesia Preprocedure Evaluation (Addendum)
Anesthesia Evaluation  Patient identified by MRN, date of birth, ID band Patient awake    Reviewed: Allergy & Precautions, NPO status , Patient's Chart, lab work & pertinent test results, reviewed documented beta blocker date and time   History of Anesthesia Complications (+) PONV  Airway Mallampati: II  TM Distance: >3 FB Neck ROM: Full    Dental no notable dental hx. (+) Teeth Intact, Dental Advisory Given   Pulmonary neg pulmonary ROS,    Pulmonary exam normal breath sounds clear to auscultation       Cardiovascular hypertension, Pt. on medications and Pt. on home beta blockers negative cardio ROS Normal cardiovascular exam Rhythm:Regular Rate:Normal  TTE 2018 - Normal LV systolic function; mild diastolic dysfunction;  sclerotic aortic valve with trace AI; mild MR; mild TR.   Neuro/Psych PSYCHIATRIC DISORDERS Anxiety CVA, No Residual Symptoms negative neurological ROS  negative psych ROS   GI/Hepatic negative GI ROS, Neg liver ROS,   Endo/Other  negative endocrine ROS  Renal/GU Renal InsufficiencyRenal disease (K 3.9)negative Renal ROS  negative genitourinary   Musculoskeletal negative musculoskeletal ROS (+)   Abdominal   Peds  Hematology negative hematology ROS (+) Blood dyscrasia (on plavix, Hgb 8.7), anemia ,   Anesthesia Other Findings EGD for UGIB  Reproductive/Obstetrics                            Anesthesia Physical Anesthesia Plan  ASA: III  Anesthesia Plan: MAC   Post-op Pain Management:    Induction: Intravenous  PONV Risk Score and Plan: 3 and Propofol infusion and Treatment may vary due to age or medical condition  Airway Management Planned: Natural Airway  Additional Equipment:   Intra-op Plan:   Post-operative Plan:   Informed Consent: I have reviewed the patients History and Physical, chart, labs and discussed the procedure including the risks, benefits  and alternatives for the proposed anesthesia with the patient or authorized representative who has indicated his/her understanding and acceptance.     Dental advisory given  Plan Discussed with: CRNA  Anesthesia Plan Comments:         Anesthesia Quick Evaluation

## 2020-01-24 NOTE — Transfer of Care (Signed)
Immediate Anesthesia Transfer of Care Note  Patient: [name]  Procedure(s) Performed: ESOPHAGOGASTRODUODENOSCOPY (EGD) WITH PROPOFOL (N/A )  Patient Location: PACU  Anesthesia Type:MAC  Level of Consciousness: awake, alert  and oriented  Airway & Oxygen Therapy: Patient Spontanous Breathing and Patient connected to face mask oxygen  Post-op Assessment: Report given to RN and Post -op Vital signs reviewed and stable  Post vital signs: Reviewed and stable  Last Vitals:  Vitals Value Taken Time  BP 122/33 01/24/20 1456  Temp    Pulse 82 01/24/20 1458  Resp 16 01/24/20 1458  SpO2 97 % 01/24/20 1458  Vitals shown include unvalidated device data.  Last Pain:  Vitals:   01/24/20 1348  TempSrc: Oral  PainSc:       Patients Stated Pain Goal: 0 (15/17/61 6073)  Complications: No apparent anesthesia complications

## 2020-01-25 LAB — COMPREHENSIVE METABOLIC PANEL
ALT: 11 U/L (ref 0–44)
AST: 13 U/L — ABNORMAL LOW (ref 15–41)
Albumin: 3.1 g/dL — ABNORMAL LOW (ref 3.5–5.0)
Alkaline Phosphatase: 52 U/L (ref 38–126)
Anion gap: 5 (ref 5–15)
BUN: 26 mg/dL — ABNORMAL HIGH (ref 8–23)
CO2: 21 mmol/L — ABNORMAL LOW (ref 22–32)
Calcium: 8.5 mg/dL — ABNORMAL LOW (ref 8.9–10.3)
Chloride: 116 mmol/L — ABNORMAL HIGH (ref 98–111)
Creatinine, Ser: 0.9 mg/dL (ref 0.44–1.00)
GFR calc Af Amer: >60 mL/min (ref >60)
GFR calc non Af Amer: 60 mL/min — ABNORMAL LOW (ref >60)
Glucose, Bld: 99 mg/dL (ref 70–99)
Potassium: 3.6 mmol/L (ref 3.5–5.1)
Sodium: 142 mmol/L (ref 135–145)
Total Bilirubin: 0.7 mg/dL (ref 0.3–1.2)
Total Protein: 5.5 g/dL — ABNORMAL LOW (ref 6.5–8.1)

## 2020-01-25 LAB — TYPE AND SCREEN
ABO/RH(D): O POS
Antibody Screen: NEGATIVE
Unit division: 0

## 2020-01-25 LAB — CBC
HCT: 29.9 % — ABNORMAL LOW (ref 36.0–46.0)
Hemoglobin: 9.5 g/dL — ABNORMAL LOW (ref 12.0–15.0)
MCH: 30.2 pg (ref 26.0–34.0)
MCHC: 31.8 g/dL (ref 30.0–36.0)
MCV: 94.9 fL (ref 80.0–100.0)
Platelets: 202 10*3/uL (ref 150–400)
RBC: 3.15 MIL/uL — ABNORMAL LOW (ref 3.87–5.11)
RDW: 15.6 % — ABNORMAL HIGH (ref 11.5–15.5)
WBC: 10.3 10*3/uL (ref 4.0–10.5)
nRBC: 0 % (ref 0.0–0.2)

## 2020-01-25 LAB — BPAM RBC
Blood Product Expiration Date: 202105222359
ISSUE DATE / TIME: 202104231553
Unit Type and Rh: 5100

## 2020-01-25 MED ORDER — ONDANSETRON HCL 4 MG PO TABS: 4.0000 mg | ORAL_TABLET | Freq: Four times a day (QID) | ORAL | 0 refills | Status: DC | PRN

## 2020-01-25 MED ORDER — CLOPIDOGREL BISULFATE 75 MG PO TABS: ORAL_TABLET | ORAL | 1 refills | Status: AC

## 2020-01-25 MED ORDER — GABAPENTIN 100 MG PO CAPS: 100.0000 mg | ORAL_CAPSULE | Freq: Two times a day (BID) | ORAL | 1 refills | Status: DC

## 2020-01-25 MED ORDER — PANTOPRAZOLE SODIUM 40 MG PO TBEC
40.0000 mg | DELAYED_RELEASE_TABLET | Freq: Every day | ORAL | 1 refills | Status: AC
Start: 2020-01-25 — End: 2021-09-07

## 2020-01-25 NOTE — Discharge Summary (Addendum)
Physician Discharge Summary  [name]:[id number] DOB: Oct 10, 1935 DOA: 01/23/2020  PCP: [name], MD  Admit date: 01/23/2020 Discharge date: 01/25/2020  Admitted From: Home Disposition: Home Recommendations for Outpatient Follow-up:  1. Follow up with PCP in 1-2 weeks 2. Please obtain BMP/CBC in one week 3. Please follow up with Dr. [name] 4. Consider tapering antidepressants/antianxiety medications as an outpatient  Home Health yes Equipment/Devices none Discharge Condition: Stable  CODE STATUS full code Diet recommendation: Cardiac Brief/Interim Summary:84 year old female with history of GI bleed, stroke on Plavix, stage III CKD, hypothyroidism and hypertension admitted with coffee-ground emesis.  She is followed by Dr. [name] as an outpatient.  ED Course:Temperature 97.5 blood pressure 170/67, pulse 84 respirate of 19 oxygen sat 96% room air. White count 7.1 hemoglobin 9.9 with platelet 266. Sodium 145 potassium 4.2 chloride 112 CO2 23 BUN 54 and creatinine 1.1 calcium 9.1. INR is 1.0 glucose 86. COVID-19 screen is negative. Fecal occult blood testing positive x2. Patient initiated on IV Protonix and being admitted for treatment   Discharge Diagnoses:  Principal Problem:   Hematemesis Active Problems:   Essential hypertension   Anxiety   Dehydration   Hyperlipidemia  #1ugi bleed- EGD-2 cm hiatal hernia, normal stomach, nonbleeding duodenal ulcer with no stigmata of bleeding.  No biopsy done.  Dr. [name] recommends to continue Protonix daily indefinitely.  And to resume Plavix in 3 days and to avoid all NSAIDs.  Hemoglobin 9.6 on the day of discharge.  She received 1 unit of blood transfusion during this hospital stay.  Mobic DC'd.    #2 history of stroke restart Plavix in 3 days  #3 history of hyperlipidemia on statin  #4 anxiety disorder on BuSpar 5 mg daily, Lexapro 5 mg grams daily, Ativan 2.5 mg twice a day, Neurontin, Effexor as an outpatient.  Daughter  reports that she has stopped taking Lexapro.  PCP to follow and taper these medications.   #5 history of essential hypertension blood pressure-restart Lopressor and Diovan.  I have stopped the Norvasc.  Her blood pressure was stable during the hospital stay.  Restart if needed as an outpatient.    #6 anxiety/depression on BuSpar 5 mg twice a day, Ativan 0.5 mg twice a day   Estimated body mass index is 23.44 kg/m as calculated from the following:   Height as of this encounter: 5' (1.524 m).   Weight as of this encounter: 54.4 kg.  Discharge Instructions   Allergies as of 01/25/2020      Reactions   Amoxicillin Hives, Itching   Has patient had a PCN reaction causing immediate rash, facial/tongue/throat swelling, SOB or lightheadedness with hypotension: Yes Has patient had a PCN reaction causing severe rash involving mucus membranes or skin necrosis: Yes Has patient had a PCN reaction that required hospitalization No Has patient had a PCN reaction occurring within the last 10 years: Yes If all of the above answers are "NO", then may proceed with Cephalosporin use.      Medication List    STOP taking these medications   amLODipine 10 MG tablet Commonly known as: NORVASC   escitalopram 5 MG tablet Commonly known as: LEXAPRO   meloxicam 15 MG tablet Commonly known as: MOBIC     TAKE these medications   busPIRone 5 MG tablet Commonly known as: BUSPAR Take 5 mg by mouth 2 (two) times daily.   clopidogrel 75 MG tablet Commonly known as: PLAVIX Restart Plavix 01/28/2020 What changed:   how much to take  how to take this  when to take this  additional instructions   gabapentin 100 MG capsule Commonly known as: NEURONTIN Take 1 capsule (100 mg total) by mouth 2 (two) times daily. What changed: when to take this   LORazepam 0.5 MG tablet Commonly known as: ATIVAN Take 0.5 mg by mouth 2 (two) times daily as needed for anxiety.   metoprolol tartrate 25 MG  tablet Commonly known as: LOPRESSOR Take 0.5 tablets (12.5 mg total) by mouth 2 (two) times daily.   ondansetron 4 MG tablet Commonly known as: ZOFRAN Take 1 tablet (4 mg total) by mouth every 6 (six) hours as needed for nausea.   pantoprazole 40 MG tablet Commonly known as: Protonix Take 1 tablet (40 mg total) by mouth daily.   sertraline 25 MG tablet Commonly known as: ZOLOFT Take 25 mg by mouth daily.   simvastatin 20 MG tablet Commonly known as: ZOCOR TAKE 1 TABLET EVERY DAY AT BEDTIME What changed: See the new instructions.   valsartan 160 MG tablet Commonly known as: DIOVAN Take 1 tablet (160 mg total) by mouth daily. NEEDS APPOINTMENT FOR FUTURE REFILLS What changed: additional instructions   venlafaxine XR 150 MG 24 hr capsule Commonly known as: EFFEXOR-XR Take 150 mg by mouth daily.      Follow-up Information    [name], MD Follow up.   Specialty: Family Medicine Contact information: [address] [place] [address] [phone]        Jeani Hawking, MD Follow up.   Specialty: Gastroenterology Contact information: [address] [place] [address] [phone]          Allergies  Allergen Reactions  . Amoxicillin Hives and Itching    Has patient had a PCN reaction causing immediate rash, facial/tongue/throat swelling, SOB or lightheadedness with hypotension: Yes Has patient had a PCN reaction causing severe rash involving mucus membranes or skin necrosis: Yes Has patient had a PCN reaction that required hospitalization No Has patient had a PCN reaction occurring within the last 10 years: Yes If all of the above answers are "NO", then may proceed with Cephalosporin use.     Consultations: Dr. [name]   Procedures/Studies: DG Chest Port 1 View  Result Date: 01/23/2020 CLINICAL DATA:  Nausea, vomiting, dark emesis and stool since last night, denies abdominal pain EXAM: PORTABLE CHEST 1 VIEW  COMPARISON:  Radiograph 06/17/2016, CT 07/30/2008 FINDINGS: Chronic hyperinflation of the lungs with some coarsened reticular interstitial opacities are similar to comparison examinations. No consolidation, convincing features of edema, pneumothorax or effusion. The aorta is calcified. The remaining cardiomediastinal contours are unremarkable. Surgical clips at the base of the right neck likely reflect partial thyroidectomy. No acute osseous or soft tissue abnormality. Telemetry leads overlie the chest. IMPRESSION: 1. No acute cardiopulmonary abnormality. 2. Stable chronic hyperinflation and coarsened reticular interstitial opacities. Electronically Signed   By: Kreg Shropshire M.D.   On: 01/23/2020 19:05    (Echo, Carotid, EGD, Colonoscopy, ERCP)    Subjective: Patient resting in bed no nausea vomiting diarrhea or abdominal pain reported  Discharge Exam: Vitals:   01/25/20 0036 01/25/20 0549  BP: (!) 162/63 (!) 147/64  Pulse: 83 78  Resp: 16 12  Temp: 99.3 F (37.4 C) 98.1 F (36.7 C)  SpO2: 94% 93%   Vitals:   01/24/20 1852 01/24/20 1927 01/25/20 0036 01/25/20 0549  BP: (!) 138/53 (!) 147/58 (!) 162/63 (!) 147/64  Pulse: 77 82 83 78  Resp: 19 19 16  12  Temp: 98 F (36.7 C) 98.3 F (36.8 C) 99.3 F (37.4 C) 98.1 F (36.7 C)  TempSrc: Oral  Oral Oral  SpO2:  95% 94% 93%  Weight:      Height:        General: Pt is alert, awake, not in acute distress Cardiovascular: RRR, S1/S2 +, no rubs, no gallops Respiratory: CTA bilaterally, no wheezing, no rhonchi Abdominal: Soft, NT, ND, bowel sounds + Extremities: no edema, no cyanosis    The results of significant diagnostics from this hospitalization (including imaging, microbiology, ancillary and laboratory) are listed below for reference.     Microbiology: Recent Results (from the past 240 hour(s))  Respiratory Panel by RT PCR (Flu A&B, Covid) - Nasopharyngeal Swab     Status: None   Collection Time: 01/23/20 10:16 PM    Specimen: Nasopharyngeal Swab  Result Value Ref Range Status   SARS Coronavirus 2 by RT PCR NEGATIVE NEGATIVE Final    Comment: (NOTE) SARS-CoV-2 target nucleic acids are NOT DETECTED. The SARS-CoV-2 RNA is generally detectable in upper respiratoy specimens during the acute phase of infection. The lowest concentration of SARS-CoV-2 viral copies this assay can detect is 131 copies/mL. A negative result does not preclude SARS-Cov-2 infection and should not be used as the sole basis for treatment or other patient management decisions. A negative result may occur with  improper specimen collection/handling, submission of specimen other than nasopharyngeal swab, presence of viral mutation(s) within the areas targeted by this assay, and inadequate number of viral copies (<131 copies/mL). A negative result must be combined with clinical observations, patient history, and epidemiological information. The expected result is Negative. Fact Sheet for Patients:  PinkCheek.be Fact Sheet for Healthcare Providers:  GravelBags.it This test is not yet ap proved or cleared by the [place] FDA and  has been authorized for detection and/or diagnosis of SARS-CoV-2 by FDA under an Emergency Use Authorization (EUA). This EUA will remain  in effect (meaning this test can be used) for the duration of the COVID-19 declaration under Section 564(b)(1) of the Act, 21 U.S.C. section 360bbb-3(b)(1), unless the authorization is terminated or revoked sooner.    Influenza A by PCR NEGATIVE NEGATIVE Final   Influenza B by PCR NEGATIVE NEGATIVE Final    Comment: (NOTE) The Xpert Xpress SARS-CoV-2/FLU/RSV assay is intended as an aid in  the diagnosis of influenza from Nasopharyngeal swab specimens and  should not be used as a sole basis for treatment. Nasal washings and  aspirates are unacceptable for Xpert Xpress SARS-CoV-2/FLU/RSV  testing. Fact Sheet  for Patients: PinkCheek.be Fact Sheet for Healthcare Providers: GravelBags.it This test is not yet approved or cleared by the [place] FDA and  has been authorized for detection and/or diagnosis of SARS-CoV-2 by  FDA under an Emergency Use Authorization (EUA). This EUA will remain  in effect (meaning this test can be used) for the duration of the  Covid-19 declaration under Section 564(b)(1) of the Act, 21  U.S.C. section 360bbb-3(b)(1), unless the authorization is  terminated or revoked. Performed at [facility], [place] [address] [place], [place] [address]      Labs: BNP (last 3 results) No results for input(s): BNP in the last 8760 hours. Basic Metabolic Panel: Recent Labs  Lab 01/23/20 1802 01/23/20 1910 01/24/20 0725 01/25/20 0057  NA 143 145 146* 142  K 4.2 4.2 3.9 3.6  CL 114* 112* 119* 116*  CO2 23  --  20* 21*  GLUCOSE 93  86 87 99  BUN 62* 54* 48* 26*  CREATININE 1.20* 1.10* 1.11* 0.90  CALCIUM 9.1  --  8.7* 8.5*   Liver Function Tests: Recent Labs  Lab 01/23/20 1802 01/24/20 0725 01/25/20 0057  AST 13* 13* 13*  ALT 11 10 11   ALKPHOS 57 44 52  BILITOT 0.7 0.3 0.7  PROT 6.2* 5.4* 5.5*  ALBUMIN 3.5 3.1* 3.1*   No results for input(s): LIPASE, AMYLASE in the last 168 hours. No results for input(s): AMMONIA in the last 168 hours. CBC: Recent Labs  Lab 01/23/20 1802 01/23/20 1802 01/23/20 1910 01/24/20 0725 01/24/20 1256 01/24/20 2045 01/25/20 0057  WBC 7.1  --   --  5.4 5.4 9.6 10.3  NEUTROABS 5.3  --   --   --   --   --   --   HGB 9.6*   < > 9.9* 7.9* 8.7* 9.6* 9.5*  HCT 30.8*   < > 29.0* 25.4* 28.9* 30.1* 29.9*  MCV 95.7  --   --  98.4 102.5* 95.9 94.9  PLT 266  --   --  218 222 220 202   < > = values in this interval not displayed.   Cardiac Enzymes: No results for input(s): CKTOTAL, CKMB, CKMBINDEX, TROPONINI in the last 168 hours. BNP: Invalid input(s):  POCBNP CBG: No results for input(s): GLUCAP in the last 168 hours. D-Dimer No results for input(s): DDIMER in the last 72 hours. Hgb A1c No results for input(s): HGBA1C in the last 72 hours. Lipid Profile No results for input(s): CHOL, HDL, LDLCALC, TRIG, CHOLHDL, LDLDIRECT in the last 72 hours. Thyroid function studies No results for input(s): TSH, T4TOTAL, T3FREE, THYROIDAB in the last 72 hours.  Invalid input(s): FREET3 Anemia work up No results for input(s): VITAMINB12, FOLATE, FERRITIN, TIBC, IRON, RETICCTPCT in the last 72 hours. Urinalysis    Component Value Date/Time   COLORURINE YELLOW 12/15/2016 1932   APPEARANCEUR CLEAR 12/15/2016 1932   LABSPEC 1.014 12/15/2016 1932   PHURINE 6.0 12/15/2016 1932   GLUCOSEU NEGATIVE 12/15/2016 1932   HGBUR NEGATIVE 12/15/2016 1932   BILIRUBINUR NEGATIVE 12/15/2016 1932   KETONESUR NEGATIVE 12/15/2016 1932   PROTEINUR NEGATIVE 12/15/2016 1932   UROBILINOGEN 0.2 12/08/2009 1141   NITRITE NEGATIVE 12/15/2016 1932   LEUKOCYTESUR NEGATIVE 12/15/2016 1932   Sepsis Labs Invalid input(s): PROCALCITONIN,  WBC,  LACTICIDVEN Microbiology Recent Results (from the past 240 hour(s))  Respiratory Panel by RT PCR (Flu A&B, Covid) - Nasopharyngeal Swab     Status: None   Collection Time: 01/23/20 10:16 PM   Specimen: Nasopharyngeal Swab  Result Value Ref Range Status   SARS Coronavirus 2 by RT PCR NEGATIVE NEGATIVE Final    Comment: (NOTE) SARS-CoV-2 target nucleic acids are NOT DETECTED. The SARS-CoV-2 RNA is generally detectable in upper respiratoy specimens during the acute phase of infection. The lowest concentration of SARS-CoV-2 viral copies this assay can detect is 131 copies/mL. A negative result does not preclude SARS-Cov-2 infection and should not be used as the sole basis for treatment or other patient management decisions. A negative result may occur with  improper specimen collection/handling, submission of specimen other than  nasopharyngeal swab, presence of viral mutation(s) within the areas targeted by this assay, and inadequate number of viral copies (<131 copies/mL). A negative result must be combined with clinical observations, patient history, and epidemiological information. The expected result is Negative. Fact Sheet for Patients:  https://www.moore.com/https://www.fda.gov/media/142436/download Fact Sheet for Healthcare Providers:  https://www.young.biz/https://www.fda.gov/media/142435/download This test is not  yet ap proved or cleared by the [place] and  has been authorized for detection and/or diagnosis of SARS-CoV-2 by FDA under an Emergency Use Authorization (EUA). This EUA will remain  in effect (meaning this test can be used) for the duration of the COVID-19 declaration under Section 564(b)(1) of the Act, 21 U.S.C. section 360bbb-3(b)(1), unless the authorization is terminated or revoked sooner.    Influenza A by PCR NEGATIVE NEGATIVE Final   Influenza B by PCR NEGATIVE NEGATIVE Final    Comment: (NOTE) The Xpert Xpress SARS-CoV-2/FLU/RSV assay is intended as an aid in  the diagnosis of influenza from Nasopharyngeal swab specimens and  should not be used as a sole basis for treatment. Nasal washings and  aspirates are unacceptable for Xpert Xpress SARS-CoV-2/FLU/RSV  testing. Fact Sheet for Patients: https://www.moore.com/ Fact Sheet for Healthcare Providers: https://www.young.biz/ This test is not yet approved or cleared by the [place] FDA and  has been authorized for detection and/or diagnosis of SARS-CoV-2 by  FDA under an Emergency Use Authorization (EUA). This EUA will remain  in effect (meaning this test can be used) for the duration of the  Covid-19 declaration under Section 564(b)(1) of the Act, 21  U.S.C. section 360bbb-3(b)(1), unless the authorization is  terminated or revoked. Performed at [facility], [address] [place], [place] [address]       Time coordinating discharge:  39 minutes  SIGNED:   Alwyn Ren, MD  [facility] 01/25/2020, 10:41 AM Pager   If 7PM-7AM, please contact night-coverage [email or web address] Password [email or web address]

## 2020-01-25 NOTE — Progress Notes (Signed)
Patient up with PT and became "hot" while sitting in chair. Attempted orthostatic BP's and able to get sitting and standing. Pt became nauseated and clammy after standing approximately 2 min. BP rechecked after sitting down. Patient now dry but requesting nausea prn.Melton Alar, RN

## 2020-01-25 NOTE — Evaluation (Signed)
Physical Therapy Evaluation Patient Details Name: [name] MRN: [id number] DOB: Aug 15, 1936 Today's Date: 01/25/2020   History of Present Illness  84 year old female with history of GI bleed, stroke on Plavix, stage III CKD, hypothyroidism and hypertension admitted with coffee-ground emesis, S/P EGD.  Clinical Impression  The patient presents with general weakness , slightly diaphoretic  With activity,Patient  has not been up OOB as of yet. Patient with some  Abdominal pain and nausea. RN aware. Assisted patient to ambulate to BR and back to recliner, required HHA. Patient's spouse and daughter present and very supportive. Patient may benefit from a BSC. Patient should progress to return to independence with increased activity. Pt admitted with above diagnosis.  Pt currently with functional limitations due to the deficits listed below (see PT Problem List). Pt will benefit from skilled PT to increase their independence and safety with mobility to allow discharge to the venue listed below.  A semblance of orthostatic BP's were taken and recorded in doc flow sheets by RN.    Follow Up Recommendations No PT follow up    Equipment Recommendations  3in1 (PT)    Recommendations for Other Services       Precautions / Restrictions Precautions Precautions: Fall Precaution Comments: is weak from bedrest x 2 days      Mobility  Bed Mobility Overal bed mobility: Needs Assistance Bed Mobility: Supine to Sit     Supine to sit: Supervision     General bed mobility comments: extra time for mobilizing  Transfers Overall transfer level: Needs assistance Equipment used: 1 person hand held assist Transfers: Sit to/from Stand Sit to Stand: Min assist         General transfer comment: Steady assist to stand from bed and toilet,  Ambulation/Gait Ambulation/Gait assistance: Min assist Gait Distance (Feet): 20 Feet(x 2) Assistive device: 1 person hand held assist Gait  Pattern/deviations: Step-through pattern;Staggering left;Staggering right Gait velocity: decr   General Gait Details: unsteady gait, requires HHA, at times held objects.  Stairs            Wheelchair Mobility    Modified Rankin (Stroke Patients Only)       Balance Overall balance assessment: Needs assistance Sitting-balance support: No upper extremity supported Sitting balance-Leahy Scale: Fair     Standing balance support: Single extremity supported;During functional activity Standing balance-Leahy Scale: Poor Standing balance comment: required at least 1 HHA                             Pertinent Vitals/Pain Pain Assessment: Faces Faces Pain Scale: Hurts a little bit Pain Location: abdomen Pain Descriptors / Indicators: Discomfort Pain Intervention(s): Monitored during session;Premedicated before session    Home Living Family/patient expects to be discharged to:: Private residence Living Arrangements: Spouse/significant other Available Help at Discharge: Family;Available 24 hours/day Type of Home: House Home Access: Stairs to enter Entrance Stairs-Rails: Right Entrance Stairs-Number of Steps: 2-3 Home Layout: Two level;Laundry or work area in [employer]: Geneticist, molecular      Prior Function Level of Independence: Independent         Comments: very active     Hand Dominance   Dominant Hand: Right    Extremity/Trunk Assessment   Upper Extremity Assessment Upper Extremity Assessment: Generalized weakness    Lower Extremity Assessment Lower Extremity Assessment: Generalized weakness    Cervical / Trunk Assessment Cervical / Trunk Assessment: Normal  Communication   Communication: No difficulties;HOH  Cognition Arousal/Alertness: Awake/alert Behavior During Therapy: WFL for tasks assessed/performed Overall Cognitive Status: Within Functional Limits for tasks assessed                                         General Comments      Exercises     Assessment/Plan    PT Assessment Patient needs continued PT services  PT Problem List Decreased strength;Decreased knowledge of precautions;Decreased knowledge of use of DME;Decreased activity tolerance;Decreased safety awareness;Pain       PT Treatment Interventions DME instruction;Functional mobility training;Patient/family education;Gait training;Therapeutic exercise;Therapeutic activities    PT Goals (Current goals can be found in the Care Plan section)  Acute Rehab PT Goals Patient Stated Goal: to go home PT Goal Formulation: With patient/family Time For Goal Achievement: 02/14/20 Potential to Achieve Goals: Good    Frequency Min 3X/week   Barriers to discharge        Co-evaluation               AM-PAC PT "6 Clicks" Mobility  Outcome Measure Help needed turning from your back to your side while in a flat bed without using bedrails?: A Little Help needed moving from lying on your back to sitting on the side of a flat bed without using bedrails?: A Little Help needed moving to and from a bed to a chair (including a wheelchair)?: A Little Help needed standing up from a chair using your arms (e.g., wheelchair or bedside chair)?: A Little Help needed to walk in hospital room?: A Lot Help needed climbing 3-5 steps with a railing? : A Lot 6 Click Score: 16    End of Session Equipment Utilized During Treatment: Gait belt Activity Tolerance: Patient limited by fatigue Patient left: in chair;with call bell/phone within reach;with family/visitor present;with nursing/sitter in room Nurse Communication: Mobility status PT Visit Diagnosis: Difficulty in walking, not elsewhere classified (R26.2)    Time: 1120-1200 PT Time Calculation (min) (ACUTE ONLY): 40 min   Charges:   PT Evaluation $PT Eval Low Complexity: 1 Low PT Treatments $Gait Training: 8-22 mins $Self Care/Home Management: 8-22        Blanchard Kelch PT Acute  Rehabilitation Services Pager [phone] Office [phone]   Rada Hay 01/25/2020, 1:04 PM

## 2020-01-27 NOTE — Anesthesia Postprocedure Evaluation (Signed)
Anesthesia Post Note  Patient: [name]  Procedure(s) Performed: ESOPHAGOGASTRODUODENOSCOPY (EGD) WITH PROPOFOL (N/A )     Patient location during evaluation: Endoscopy Anesthesia Type: MAC Level of consciousness: awake and alert Pain management: pain level controlled Vital Signs Assessment: post-procedure vital signs reviewed and stable Respiratory status: spontaneous breathing, nonlabored ventilation, respiratory function stable and patient connected to nasal cannula oxygen Cardiovascular status: blood pressure returned to baseline and stable Postop Assessment: no apparent nausea or vomiting Anesthetic complications: no    Last Vitals:  Vitals:   01/25/20 0549 01/25/20 1246  BP: (!) 147/64 (!) 144/60  Pulse: 78 71  Resp: 12 17  Temp: 36.7 C 36.8 C  SpO2: 93% 95%    Last Pain:  Vitals:   01/25/20 1246  TempSrc: Oral  PainSc:                  Asuncion Shibata L Loni Delbridge

## 2020-03-04 ENCOUNTER — Encounter: Payer: Self-pay | Admitting: Cardiovascular Disease

## 2020-03-04 ENCOUNTER — Other Ambulatory Visit: Payer: Self-pay

## 2020-03-04 ENCOUNTER — Ambulatory Visit (INDEPENDENT_AMBULATORY_CARE_PROVIDER_SITE_OTHER): Payer: Medicare PPO | Admitting: Cardiovascular Disease

## 2020-03-04 VITALS — BP 130/84 | HR 64 | Ht 60.0 in | Wt 124.4 lb

## 2020-03-04 DIAGNOSIS — I10 Essential (primary) hypertension: Secondary | ICD-10-CM | POA: Diagnosis not present

## 2020-03-04 DIAGNOSIS — E785 Hyperlipidemia, unspecified: Secondary | ICD-10-CM

## 2020-03-04 DIAGNOSIS — F329 Major depressive disorder, single episode, unspecified: Secondary | ICD-10-CM

## 2020-03-04 DIAGNOSIS — K922 Gastrointestinal hemorrhage, unspecified: Secondary | ICD-10-CM | POA: Diagnosis not present

## 2020-03-04 DIAGNOSIS — F32A Depression, unspecified: Secondary | ICD-10-CM

## 2020-03-04 DIAGNOSIS — I63431 Cerebral infarction due to embolism of right posterior cerebral artery: Secondary | ICD-10-CM

## 2020-03-04 DIAGNOSIS — F419 Anxiety disorder, unspecified: Secondary | ICD-10-CM

## 2020-03-04 MED ORDER — AMLODIPINE BESYLATE 2.5 MG PO TABS
2.5000 mg | ORAL_TABLET | Freq: Every evening | ORAL | 3 refills | Status: DC
Start: 2020-03-04 — End: 2021-04-07

## 2020-03-04 NOTE — Patient Instructions (Signed)
Medication Instructions:  BEGIN TAKING AMLODIPINE 2.5MG  AT BEDTIME  *If you need a refill on your cardiac medications before your next appointment, please call your pharmacy*   Lab Work: FASTING LABS: CMET CBC LIPID If you have labs (blood work) drawn today and your tests are completely normal, you will receive your results only by: Marland Kitchen MyChart Message (if you have MyChart) OR . A paper copy in the mail If you have any lab test that is abnormal or we need to change your treatment, we will call you to review the results.    Follow-Up: At [facility], you and your health needs are our priority.  As part of our continuing mission to provide you with exceptional heart care, we have created designated Provider Care Teams.  These Care Teams include your primary Cardiologist (physician) and Advanced Practice Providers (APPs -  Physician Assistants and Nurse Practitioners) who all work together to provide you with the care you need, when you need it.  We recommend signing up for the patient portal called "MyChart".  Sign up information is provided on this After Visit Summary.  MyChart is used to connect with patients for Virtual Visits (Telemedicine).  Patients are able to view lab/test results, encounter notes, upcoming appointments, etc.  Non-urgent messages can be sent to your provider as well.   To learn more about what you can do with MyChart, go to [email or web address].    Your next appointment:   6 month(s)  The format for your next appointment:   In Person  Provider:   Nicki Guadalajara, MD

## 2020-03-04 NOTE — Progress Notes (Signed)
Cardiology Office Note    Date:  03/07/2020   ID:  REET SCHARRER, DOB 04/09/1936, MRN [id number]  PCP:  Maurice Small, MD  Cardiologist:  Shelva Majestic, MD    History of Present Illness:  [name] is a 84 y.o. female who was referred through the courtesy of Dr. Justin Mend for evaluation of possible syncope resulting in a fall.I saw her for initial evaluation in April 2018, and last saw her in September 2018.  She presents for a 21-monthfollow-up evaluation.  Ms KHilscherhas a history of hypertension, chronic kidney disease, thyroid disease, and in 2017 developed an upper GI bleed and gram-negative rides bacteremia.  She had altered mental status.  An MRI revealed acute right PCA territory with scattered infarcts.  An MRA demonstrated distal right PCA occlusion, PCA and left A2 high-grade stenosis.  There were no mycotic aneurysms.  A carotid Doppler and transthoracic echo were unremarkable.  Blood cultures were positive for Escherichia coli.  Cardiology consultation was never obtained but she underwent a TEE and was found to have a possible small vegetation on the aortic valve with trivial aortic insufficiency.  PICC line was placed and she was started on antibiotics which she received for 6 weeks.  She was evaluated by neurology and infectious disease was on the hospitalist service.  Subsequently, she has stabilized and has seen neurology in follow-up was advised to continue Plavix and Zocor for stroke prevention.  She has been followed by Dr. WJustin Mendfor primary care.  She has had blood pressure lability and had been on amlodipine 5 mg and Toprol-XL.  On 12/15/2016 she presented To Dr. WJason Nestoffice following a fall driveway while going to the mailbox.  She felt lightheaded and it was uncertain if she had a true syncopal spell.  She was unaware of any tachycardia palpitations.  She was referred to the ER and it was felt most likely that she was dehydrated leading to her weakness.  She  had recently not been  eating; she was hydrated with a liter of fluid and felt improved and sent home.  She saw Dr. CMaurice Smallin follow-up evaluation.  Her blood pressure was 179/87 and amlodipine was further titrated to 10 mg from 5 mg and lorazepam was decreased.  There was concern that her ECG may have shown a prior MI.    When I initially saw her in April 2018 she has significant orthostatic hypotension and bradycardic.  I recommended that she reduce her amlodipine down to 5 mg  and metoprolol dose was reduced to 12.5 mg twice a day.  I scheduled her for renal duplex study to make certain there was no renal vascular etiology to her blood pressure lability.  This revealed normal caliber abdominal aorta with normal renal arteries bilaterally.  Normal bilateral kidney size.  An echo Doppler study done in May 2018 showed normal EF of 55-60% with mild aortic sclerosis with trace AR, mild MR and TR.    At follow-up evaluation in June 2018 her blood pressure was elevated in the early morning hours and I added valsartan, initially at 80 mg and titrated to 160 mg.  On her medical regimen, she has felt improved and is now on amlodipine 5 mg, valsartan 160 mg and metoprolol, tartrate 12.5 mg twice a day.  She was never notified concerning valsartan impurity in her generic product and continues to take the prescribed dose.  She continues to be on simvastatin 20 mg daily for hyperlipidemia  and also is on clopidogrel 75 mg daily.  She admits to some fatigue.  She broke her left kneecap several weeks ago.  She denied chest pain or palpitations.    Since her last evaluation in September 2018, she has done well.  She denies chest pain or palpitations.  She has noticed blood pressure lability.  Over the last several years she had been on amlodipine but apparently this had been stopped.  She has been on metoprolol tartrate 12.5 mg twice a day in addition to valsartan 160 mg daily.  She continues to be on simvastatin for hyperlipidemia.  She is on  Effexor for depression in addition to BuSpar.  She is on Plavix with a history of stroke.  She was recently hospitalized from April 22 through January 25, 2020 with upper GI bleed and was found to have a 2 cm hiatal hernia, normal stomach, nonbleeding duodenal ulcer with recommendation to continue Protonix daily indefinitely.  Plavix had been held and she was told to avoid all nonsteroidal anti-inflammatory agents subsequently resume Plavix.  During the hospitalization she received 1 unit of blood cell transfusion.  During her hospitalization, her amlodipine was discontinued and she was restarted back on metoprolol in addition to valsartan.   Past Medical History:  Diagnosis Date  . Blood transfusion without reported diagnosis   . CKD (chronic kidney disease), stage III   . Hearing loss    pt wears hearing aid in both ears  . Hypertension   . PONV (postoperative nausea and vomiting)    nausea  . Stroke ([place]) 06/2016  . Thyroid disease     Past Surgical History:  Procedure Laterality Date  . ABDOMINAL HYSTERECTOMY    . CHOLECYSTECTOMY N/A 08/13/2015   Procedure: LAPAROSCOPIC CHOLECYSTECTOMY WITH INTRAOPERATIVE CHOLANGIOGRAM;  Surgeon: Jackolyn Confer, MD;  Location: [facility];  Service: General;  Laterality: N/A;  . ESOPHAGOGASTRODUODENOSCOPY N/A 06/14/2016   Procedure: ESOPHAGOGASTRODUODENOSCOPY (EGD);  Surgeon: Carol Ada, MD;  Location[facility] ENDOSCOPY;  Service: Endoscopy;  Laterality: N/A;  . ESOPHAGOGASTRODUODENOSCOPY (EGD) WITH PROPOFOL N/A 01/24/2020   Procedure: ESOPHAGOGASTRODUODENOSCOPY (EGD) WITH PROPOFOL;  Surgeon: Carol Ada, MD;  Location: [facility] ENDOSCOPY;  Service: Endoscopy;  Laterality: N/A;  . TEE WITHOUT CARDIOVERSION N/A 06/21/2016   Procedure: TRANSESOPHAGEAL ECHOCARDIOGRAM (TEE);  Surgeon: Lelon Perla, MD;  Location: [facility] ENDOSCOPY;  Service: Cardiovascular;  Laterality: N/A;  . THYROIDECTOMY, PARTIAL      Current Medications: Outpatient Medications Prior to Visit    Medication Sig Dispense Refill  . busPIRone (BUSPAR) 5 MG tablet Take 5 mg by mouth 2 (two) times daily.    . clopidogrel (PLAVIX) 75 MG tablet Restart Plavix 01/28/2020 30 tablet 1  . estradiol (ESTRACE) 0.1 MG/GM vaginal cream Place vaginally.    . gabapentin (NEURONTIN) 100 MG capsule Take 1 capsule (100 mg total) by mouth 2 (two) times daily. 60 capsule 1  . LORazepam (ATIVAN) 0.5 MG tablet Take 0.5 mg by mouth 2 (two) times daily as needed for anxiety.    . metoprolol tartrate (LOPRESSOR) 25 MG tablet Take 0.5 tablets (12.5 mg total) by mouth 2 (two) times daily. 90 tablet 3  . ondansetron (ZOFRAN) 4 MG tablet Take 1 tablet (4 mg total) by mouth every 6 (six) hours as needed for nausea. 20 tablet 0  . pantoprazole (PROTONIX) 40 MG tablet Take 1 tablet (40 mg total) by mouth daily. 30 tablet 1  . sertraline (ZOLOFT) 25 MG tablet Take 25 mg by mouth daily.    . simvastatin (  ZOCOR) 20 MG tablet TAKE 1 TABLET EVERY DAY AT BEDTIME (Patient taking differently: Take 20 mg by mouth daily at 6 PM. ) 90 tablet 0  . valsartan (DIOVAN) 160 MG tablet Take 1 tablet (160 mg total) by mouth daily. NEEDS APPOINTMENT FOR FUTURE REFILLS (Patient taking differently: Take 160 mg by mouth daily. ) 90 tablet 0  . venlafaxine XR (EFFEXOR-XR) 150 MG 24 hr capsule Take 150 mg by mouth daily.    Marland Kitchen amLODipine (NORVASC) 10 MG tablet amlodipine 10 mg tablet  TAKE 1 TABLET DAILY     No facility-administered medications prior to visit.     Allergies:   Amoxicillin and Meloxicam   Social History   Socioeconomic History  . Marital status: Married    Spouse name: Not on file  . Number of children: Not on file  . Years of education: Not on file  . Highest education level: Not on file  Occupational History  . Not on file  Tobacco Use  . Smoking status: Never Smoker  . Smokeless tobacco: Never Used  Substance and Sexual Activity  . Alcohol use: No  . Drug use: No  . Sexual activity: Not on file  Other Topics  Concern  . Not on file  Social History Narrative  . Not on file   Social Determinants of Health   Financial Resource Strain:   . Difficulty of Paying Living Expenses:   Food Insecurity:   . Worried About Charity fundraiser in the Last Year:   . Arboriculturist in the Last Year:   Transportation Needs:   . Film/video editor (Medical):   Marland Kitchen Lack of Transportation (Non-Medical):   Physical Activity:   . Days of Exercise per Week:   . Minutes of Exercise per Session:   Stress:   . Feeling of Stress :   Social Connections:   . Frequency of Communication with Friends and Family:   . Frequency of Social Gatherings with Friends and Family:   . Attends Religious Services:   . Active Member of Clubs or Organizations:   . 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  She is originally from [place] and move back following her education. . Family History:  The patient's family history includes Heart attack in her father; Hypertension in her mother; Other in her daughter; Stroke in her mother.   ROS General: Negative; No fevers, chills, or night sweats;  Regional dizziness HEENT: Negative; No changes in vision or hearing, sinus congestion, difficulty swallowing Pulmonary: Negative; No cough, wheezing, shortness of breath, hemoptysis Cardiovascular: See HPI GI: Negative; No nausea, vomiting, diarrhea, or abdominal pain GU: Negative; No dysuria, hematuria, or difficulty voiding Musculoskeletal: Negative; no myalgias, joint pain, or weakness Hematologic/Oncology: Negative; no easy bruising, bleeding Endocrine: Negative; no heat/cold intolerance; no diabetes Neuro: Negative; no changes in balance, headaches Skin: Negative; No rashes or skin lesions Psychiatric: Negative; No behavioral problems, depression Sleep: Negative; No snoring, daytime sleepiness,  hypersomnolence, bruxism, restless legs, hypnogognic hallucinations, no cataplexy Other comprehensive 14 point system review is negative.   PHYSICAL EXAM:   VS:  BP 130/84   Pulse 64   Ht 5' (1.524 m)   Wt 124 lb 6.4 oz (56.4 kg)   LMP  (LMP Unknown)   SpO2 99%   BMI 24.30 kg/m  Repeat blood pressure by me was 158/80  Wt Readings from Last 3 Encounters:  03/04/20 124 lb 6.4 oz (56.4 kg)  01/23/20 120 lb (54.4 kg)  06/23/17 118 lb (53.5 kg)     General: Alert, oriented, no distress.  Skin: normal turgor, no rashes, warm and dry HEENT: Normocephalic, atraumatic. Pupils equal round and reactive to light; sclera anicteric; extraocular muscles intact;  Nose without nasal septal hypertrophy Mouth/Parynx benign; Mallinpatti scale 3 Neck: No JVD, no carotid bruits; normal carotid upstroke Lungs: clear to ausculatation and percussion; no wheezing or rales Chest wall: without tenderness to palpitation Heart: PMI not displaced, RRR, s1 s2 normal, 1/6 systolic murmur, no diastolic murmur, no rubs, gallops, thrills, or heaves Abdomen: soft, nontender; no hepatosplenomehaly, BS+; abdominal aorta nontender and not dilated by palpation. Back: no CVA tenderness Pulses 2+ Musculoskeletal: full range of motion, normal strength, no joint deformities Extremities: no clubbing cyanosis or edema, Homan's sign negative  Neurologic: grossly nonfocal; Cranial nerves grossly wnl Psychologic: Normal mood and affect    Studies/Labs Reviewed:   EKG:  EKG is ordered today. ECG (independently read by me): Normal sinus rhythm at 64 bpm.  Normal intervals.  No ectopy.  No ST segment changes.  June 23, 2017 ECG (independently read by me): Sinus bradycardia 54 bpm with first-degree AV block, PR interval 210 ms.  QTc interval 373 ms.  June 2018 ECG (independently read by me): Normal sinus rhythm at 60 bpm.  No ectopy.  No significant ST-T change.  QTc interval 392 ms.  April 2018 ECG  (independently read by me): Sinus bradycardia 52 bpm.  Borderline LVH by voltage.  PR interval 196 ms, QTc interval 383 ms.  Recent Labs: BMP Latest Ref Rng & Units 01/25/2020 01/24/2020 01/23/2020  Glucose 70 - 99 mg/dL 99 87 86  BUN 8 - 23 mg/dL 26(H) 48(H) 54(H)  Creatinine 0.44 - 1.00 mg/dL 0.90 1.11(H) 1.10(H)  BUN/Creat Ratio 12 - 28 - - -  Sodium 135 - 145 mmol/L 142 146(H) 145  Potassium 3.5 - 5.1 mmol/L 3.6 3.9 4.2  Chloride 98 - 111 mmol/L 116(H) 119(H) 112(H)  CO2 22 - 32 mmol/L 21(L) 20(L) -  Calcium 8.9 - 10.3 mg/dL 8.5(L) 8.7(L) -     Hepatic Function Latest Ref Rng & Units 01/25/2020 01/24/2020 01/23/2020  Total Protein 6.5 - 8.1 g/dL 5.5(L) 5.4(L) 6.2(L)  Albumin 3.5 - 5.0 g/dL 3.1(L) 3.1(L) 3.5  AST 15 - 41 U/L 13(L) 13(L) 13(L)  ALT 0 - 44 U/L _0 Alk Phosphatase 38 - 126 U/L 52 44 57  Total Bilirubin 0.3 - 1.2 mg/dL 0.7 0.3 0.7    CBC Latest Ref Rng & Units 01/25/2020 01/24/2020 01/24/2020  WBC 4.0 - 10.5 K/uL 10.3 9.6 5.4  Hemoglobin 12.0 - 15.0 g/dL 9.5(L) 9.6(L) 8.7(L)  Hematocrit 36.0 - 46.0 % 29.9(L) 30.1(L) 28.9(L)  Platelets 150 - 400 K/uL 202 220 222   Lab Results  Component Value Date   MCV 94.9 01/25/2020   MCV 95.9 01/24/2020   MCV 102.5 (H) 01/24/2020   Lab Results  Component Value Date   TSH 0.433 06/14/2016   Lab Results  Component Value Date   HGBA1C 5.1 06/17/2016     BNP No results found for: BNP  ProBNP No results found for: PROBNP   Lipid Panel     Component Value Date/Time   CHOL 200 06/17/2016 0340   TRIG 203 (H) 06/17/2016 0340   HDL 39 (L) 06/17/2016 0340  CHOLHDL 5.1 06/17/2016 0340   VLDL 41 (H) 06/17/2016 0340   LDLCALC 120 (H) 06/17/2016 0340     RADIOLOGY: No results found.   Additional studies/ records that were reviewed today include:  I reviewed the patient's hospital records from her September 2017 admission, her TEE, subsequent ER visit, and office visits at [facility] physicians with Dr. Maurice Small.   I reviewed her recent hospitalization from April 22 through January 25, 2020 with upper GI bleed as well as endoscopy by Dr. Benson Norway.   ASSESSMENT:    1. Essential hypertension   2. Embolic stroke involving right posterior cerebral artery (HCC)   3. Upper GI bleed   4. Hyperlipidemia, unspecified hyperlipidemia type   5. Anxiety and depression     PLAN:  Ms. Han Lysne is a very pleasant 84 year old female who has a greater than 30 year history of hypertension, and in September 2017 developed an upper GI bleed and had gram negative rod bacteremia secondary to Escherichia coli at time was felt to have a possible small aortic valve vegetation for which she was treated with 6 weeks of antibiotic therapy  She had previously been found to have a small oscillating density in the aortic valve, raising the possibility of a vegetation.  A 2-D echo Doppler study  on 02/14/2017  showed normal EF at 55-60%. There was mild aortic sclerosis with trace AR, mild MR and mild TR without mention of her previously noted oscillating density.  She has had issues with significant blood pressure lability and when I saw her initially, she was orthostatic, which led to dose reduction.  I have not seen her in almost 3 years.  She recently had another episode of GI bleed bleed and required 1 unit of blood cell transfusion.  During that hospitalization her amlodipine was discontinued and metoprolol and valsartan were transiently held.  She is now back on valsartan and metoprolol.  Blood pressure today is elevated and on repeat by me was 158/80 initially and repeat 160/80.  I have recommended resumption of low-dose amlodipine and she will start this at 2.5 mg at bedtime.  She does not have any chest pain or anginal symptoms.  She continues to be on simvastatin 20 mg.  She is now on Protonix 40 mg with plans to take this indefinitely.  I am recommending a comprehensive metabolic panel, CBC and lipid studies be obtained in the fasting  state.  She continues to be on BuSpar and Effexor XR for anxiety/depression.  She return to Dr. Maurice Small who is her primary MD.  I will see her in 6 months for cardiology reevaluation.   Medication Adjustments/Labs and Tests Ordered: Current medicines are reviewed at length with the patient today.  Concerns regarding medicines are outlined above.  Medication changes, Labs and Tests ordered today are listed in the Patient Instructions below. Patient Instructions  Medication Instructions:  BEGIN TAKING AMLODIPINE 2.5MG AT BEDTIME  *If you need a refill on your cardiac medications before your next appointment, please call your pharmacy*   Lab Work: FASTING LABS: CMET CBC LIPID If you have labs (blood work) drawn today and your tests are completely normal, you will receive your results only by: Marland Kitchen MyChart Message (if you have MyChart) OR . A paper copy in the mail If you have any lab test that is abnormal or we need to change your treatment, we will call you to review the results.    Follow-Up: At [facility], you and your health  needs are our priority.  As part of our continuing mission to provide you with exceptional heart care, we have created designated Provider Care Teams.  These Care Teams include your primary Cardiologist (physician) and Advanced Practice Providers (APPs -  Physician Assistants and Nurse Practitioners) who all work together to provide you with the care you need, when you need it.  We recommend signing up for the patient portal called "MyChart".  Sign up information is provided on this After Visit Summary.  MyChart is used to connect with patients for Virtual Visits (Telemedicine).  Patients are able to view lab/test results, encounter notes, upcoming appointments, etc.  Non-urgent messages can be sent to your provider as well.   To learn more about what you can do with MyChart, go to [email or web address].    Your next appointment:   6 month(s)  The format  for your next appointment:   In Person  Provider:   Shelva Majestic, MD        Signed, Shelva Majestic, MD  03/07/2020 3:21 PM    [place] [address] [place], [place], [place]  [address] Phone: [phone]

## 2020-03-07 ENCOUNTER — Encounter: Payer: Self-pay | Admitting: Cardiovascular Disease

## 2021-01-06 DIAGNOSIS — N3090 Cystitis, unspecified without hematuria: Secondary | ICD-10-CM | POA: Diagnosis not present

## 2021-01-06 DIAGNOSIS — R300 Dysuria: Secondary | ICD-10-CM | POA: Diagnosis not present

## 2021-01-26 DIAGNOSIS — I5032 Chronic diastolic (congestive) heart failure: Secondary | ICD-10-CM | POA: Diagnosis not present

## 2021-01-26 DIAGNOSIS — R519 Headache, unspecified: Secondary | ICD-10-CM | POA: Diagnosis not present

## 2021-01-26 DIAGNOSIS — N281 Cyst of kidney, acquired: Secondary | ICD-10-CM | POA: Diagnosis not present

## 2021-01-26 DIAGNOSIS — G8929 Other chronic pain: Secondary | ICD-10-CM | POA: Diagnosis not present

## 2021-01-26 DIAGNOSIS — I129 Hypertensive chronic kidney disease with stage 1 through stage 4 chronic kidney disease, or unspecified chronic kidney disease: Secondary | ICD-10-CM | POA: Diagnosis not present

## 2021-01-26 DIAGNOSIS — N1832 Chronic kidney disease, stage 3b: Secondary | ICD-10-CM | POA: Diagnosis not present

## 2021-01-27 DIAGNOSIS — N1832 Chronic kidney disease, stage 3b: Secondary | ICD-10-CM | POA: Diagnosis not present

## 2021-04-03 ENCOUNTER — Other Ambulatory Visit: Payer: Self-pay | Admitting: Cardiovascular Disease

## 2021-05-27 DIAGNOSIS — L570 Actinic keratosis: Secondary | ICD-10-CM | POA: Diagnosis not present

## 2021-07-31 ENCOUNTER — Emergency Department (HOSPITAL_COMMUNITY): Payer: Medicare PPO

## 2021-07-31 ENCOUNTER — Other Ambulatory Visit (HOSPITAL_COMMUNITY): Payer: Self-pay | Admitting: Student

## 2021-07-31 ENCOUNTER — Emergency Department (HOSPITAL_COMMUNITY)
Admission: EM | Admit: 2021-07-31 | Discharge: 2021-07-31 | Disposition: A | Discharge status: Home / Self Care | Payer: Medicare PPO | Attending: Emergency Medicine | Admitting: Emergency Medicine

## 2021-07-31 DIAGNOSIS — S0990XA Unspecified injury of head, initial encounter: Secondary | ICD-10-CM

## 2021-07-31 DIAGNOSIS — G4489 Other headache syndrome: Secondary | ICD-10-CM | POA: Diagnosis not present

## 2021-07-31 DIAGNOSIS — W19XXXA Unspecified fall, initial encounter: Secondary | ICD-10-CM | POA: Diagnosis not present

## 2021-07-31 DIAGNOSIS — W108XXA Fall (on) (from) other stairs and steps, initial encounter: Secondary | ICD-10-CM | POA: Diagnosis not present

## 2021-07-31 DIAGNOSIS — R9431 Abnormal electrocardiogram [ECG] [EKG]: Secondary | ICD-10-CM | POA: Diagnosis not present

## 2021-07-31 DIAGNOSIS — I10 Essential (primary) hypertension: Secondary | ICD-10-CM | POA: Diagnosis not present

## 2021-07-31 DIAGNOSIS — S0003XA Contusion of scalp, initial encounter: Secondary | ICD-10-CM | POA: Diagnosis not present

## 2021-07-31 DIAGNOSIS — Z043 Encounter for examination and observation following other accident: Secondary | ICD-10-CM | POA: Diagnosis not present

## 2021-07-31 DIAGNOSIS — M47812 Spondylosis without myelopathy or radiculopathy, cervical region: Secondary | ICD-10-CM | POA: Diagnosis not present

## 2021-07-31 MED ORDER — ACETAMINOPHEN 500 MG PO TABS
1000.0000 mg | ORAL_TABLET | Freq: Once | ORAL | Status: AC
  Administered 2021-07-31: 1000 mg via ORAL
  Filled 2021-07-31: qty 2

## 2021-07-31 NOTE — Discharge Instructions (Addendum)
Please read and follow all provided instructions.  Your diagnoses today include:  1. Minor head injury, initial encounter     Tests performed today include: Your CT scan did not show any acute traumatic injuries to your brain or neck from your fall today but it did show a lytic lesion in your thoracic spine at T2 level, please call first thing Monday to schedule follow-up appointment with your primary care doctor regarding this.  Sometimes these lytic lesions can be due to to underlying cancer and this will need further evaluation. EKG Vital signs. See below for your results today.   Medications prescribed:  None  Take any prescribed medications only as directed.  Home care instructions:  Follow any educational materials contained in this packet.  BE VERY CAREFUL not to take multiple medicines containing Tylenol (also called acetaminophen). Doing so can lead to an overdose which can damage your liver and cause liver failure and possibly death.   Follow-up instructions: Please follow-up with your primary care provider as needed for further evaluation of your symptoms.   Return instructions:  SEEK IMMEDIATE MEDICAL ATTENTION IF: There is confusion or drowsiness (although children frequently become drowsy after injury).  You cannot awaken the injured person.  You have more than one episode of vomiting.  You notice dizziness or unsteadiness which is getting worse, or inability to walk.  You have convulsions or unconsciousness.  You experience severe, persistent headaches not relieved by Tylenol. You cannot use arms or legs normally.  There are changes in pupil sizes. (This is the black center in the colored part of the eye)  There is clear or bloody discharge from the nose or ears.  You have change in speech, vision, swallowing, or understanding.  Localized weakness, numbness, tingling, or change in bowel or bladder control. You have any other emergent concerns.  Additional  Information: You have had a head injury which does not appear to require admission at this time.  Your vital signs today were: BP (!) 165/67   Pulse 62   Temp (!) 97.5 F (36.4 C) (Oral)   Resp 11   Ht 5' (1.524 m)   Wt 54.4 kg   SpO2 97%   BMI 23.44 kg/m  If your blood pressure (BP) was elevated above 135/85 this visit, please have this repeated by your doctor within one month. --------------

## 2021-07-31 NOTE — ED Triage Notes (Signed)
Pt BIB [facility] from the [facility] after a witnessed fall. Pt is on plavix and hit her head. Pt has a hematoma to the left back side of the head. Pt states the only pain she is having is a headache.

## 2021-07-31 NOTE — Progress Notes (Signed)
Orthopedic Tech Progress Note Patient Details:  RAKSHA WOLFGANG 22-Jul-1936 [id number]  Level 2 trauma  Patient ID: Shelsea Held, female   DOB: December 13, 1935, 85 y.o.   MRN: [id number]  Docia Furl 07/31/2021, 4:34 PM

## 2021-07-31 NOTE — ED Provider Notes (Signed)
[facility] EMERGENCY DEPARTMENT Provider Note   CSN: [id number] Arrival date & time: 07/31/21  1436     History Chief Complaint  Patient presents with   [name] is a 85 y.o. female.  Patient presents to the emergency department for evaluation of head injury.  She has a history of stroke, fully recovered without residual symptoms, on Plavix.  She was at the [facility] and was walking down some stairs when she stumbled.  She did not feel lightheaded or dizzy prior to falling.  She did not have any chest pain or shortness of breath.  She states that she tripped over a couple of stairs and fell hitting the back of her head.  No loss of consciousness.  No subsequent vomiting.  She complains of a mild headache and no neck pain.  States that she was doing well prior to falling.  No recent illnesses, vomiting, diarrhea.  Eating and drinking well.  Onset of symptoms acute.  Course is constant.  Transported by EMS.  Nothing make symptoms better or worse.      No past medical history on file.  There are no problems to display for this patient.   The histories are not reviewed yet. Please review them in the "History" navigator section and refresh this SmartLink.   OB History   No obstetric history on file.     No family history on file.     Home Medications Prior to Admission medications   Not on File    Allergies    Patient has no allergy information on record.  Review of Systems   Review of Systems  Constitutional:  Negative for fatigue.  HENT:  Negative for tinnitus.   Eyes:  Negative for photophobia, pain and visual disturbance.  Respiratory:  Negative for shortness of breath.   Cardiovascular:  Negative for chest pain.  Gastrointestinal:  Negative for nausea and vomiting.  Musculoskeletal:  Negative for back pain, gait problem and neck pain.  Skin:  Negative for wound.  Neurological:  Positive for headaches. Negative for dizziness,  weakness, light-headedness and numbness.  Psychiatric/Behavioral:  Negative for confusion and decreased concentration.    Physical Exam Updated Vital Signs BP (!) 170/84 (BP Location: Right Arm)   Physical Exam Vitals and nursing note reviewed.  Constitutional:      Appearance: She is well-developed.  HENT:     Head: Normocephalic. No raccoon eyes or Battle's sign.     Comments: Soreness without significant hematoma to the occipital area, on the left.  No laceration or bleeding.    Right Ear: Tympanic membrane, ear canal and external ear normal. No hemotympanum.     Left Ear: Tympanic membrane, ear canal and external ear normal. No hemotympanum.     Nose: Nose normal.     Mouth/Throat:     Pharynx: Uvula midline.  Eyes:     General: Lids are normal.     Extraocular Movements:     Right eye: No nystagmus.     Left eye: No nystagmus.     Conjunctiva/sclera: Conjunctivae normal.     Pupils: Pupils are equal, round, and reactive to light.     Comments: No visible hyphema noted  Cardiovascular:     Rate and Rhythm: Normal rate and regular rhythm.  Pulmonary:     Effort: Pulmonary effort is normal.     Breath sounds: Normal breath sounds.  Abdominal:     Palpations: Abdomen is  soft.     Tenderness: There is no abdominal tenderness.  Musculoskeletal:     Cervical back: Normal range of motion and neck supple. No tenderness or bony tenderness.     Thoracic back: No tenderness or bony tenderness.     Lumbar back: No tenderness or bony tenderness.  Skin:    General: Skin is warm and dry.  Neurological:     Mental Status: She is alert and oriented to person, place, and time.     GCS: GCS eye subscore is 4. GCS verbal subscore is 5. GCS motor subscore is 6.     Cranial Nerves: No cranial nerve deficit.     Sensory: No sensory deficit.     Coordination: Coordination normal.     Deep Tendon Reflexes: Reflexes are normal and symmetric.    ED Results / Procedures / Treatments    Labs (all labs ordered are listed, but only abnormal results are displayed) Labs Reviewed - No data to display  EKG None  Radiology No results found.  Procedures Procedures   Medications Ordered in ED Medications - No data to display  ED Course  I have reviewed the triage vital signs and the nursing notes.  Pertinent labs & imaging results that were available during my care of the patient were reviewed by me and considered in my medical decision making (see chart for details).  Patient seen and examined. Work-up initiated.  She looks well.  Currently only complains of a mild headache.  No decompensation during EMS transport.  I do not feel strongly that she requires labs at this time.  Will obtain CT head and cervical spine.  If negative, will have her ambulate.  Vital signs reviewed and are as follows: BP (!) 170/84 (BP Location: Right Arm)   Signout to oncoming provider at shift change.   MDM Rules/Calculators/A&P                               Final Clinical Impression(s) / ED Diagnoses Final diagnoses:  Minor head injury, initial encounter    Rx / DC Orders ED Discharge Orders     None        Renne Crigler, PA-C 07/31/21 1532    Wynetta Fines, MD 08/01/21 [phone]

## 2021-07-31 NOTE — ED Provider Notes (Addendum)
Care assumed from PA [facility] at shift change, please see his note for full details, but in brief patient had a mechanical fall and hit her head, patient is on Plavix.  Complaining of mild headache but no significant signs of trauma and no neurologic deficits.  CT of the head and cervical spine pending at shift change, if negative and patient is ambulatory anticipate discharge home.   BP (!) 165/67   Pulse 62   Temp (!) 97.5 F (36.4 C) (Oral)   Resp 11   Ht 5' (1.524 m)   Wt 54.4 kg   SpO2 97%   BMI 23.44 kg/m    ED Course/Procedures  CT HEAD WO CONTRAST ( )  Result Date: 07/31/2021 CLINICAL DATA:  Larey Seat.  Hit head. EXAM: CT HEAD WITHOUT CONTRAST CT CERVICAL SPINE WITHOUT CONTRAST TECHNIQUE: Multidetector CT imaging of the head and cervical spine was performed following the standard protocol without intravenous contrast. Multiplanar CT image reconstructions of the cervical spine were also generated. COMPARISON:  Head CT 12/15/2016 FINDINGS: CT HEAD FINDINGS Brain: Progressive age related cerebral atrophy, ventriculomegaly and periventricular white matter disease. No extra-axial fluid collections are identified. No CT findings for acute hemispheric infarction or intracranial hemorrhage. No mass lesions. The brainstem and cerebellum are normal. Vascular: Stable vascular calcifications. No aneurysm hyperdense vessels. Skull: No acute skull fracture or bone lesion. Sinuses/Orbits: The paranasal sinuses and mastoid air cells are clear. The globes are intact. Other: There is a E scalp hematoma noted at the left posterior vertex. No underlying skull fracture. CT CERVICAL SPINE FINDINGS Alignment: Stable overall alignment of the cervical vertebral bodies. There is degenerative anterior subluxation C3, C4 and C5 due to severe facet disease. Skull base and vertebrae: No acute cervical spine fracture is identified. There is a new mixed lytic and sclerotic process involving the T2 vertebral body with  suspected pathologic fracture of the superior endplate and minimal retropulsion. MRI thoracic spine without and with contrast may be helpful for further evaluation. Soft tissues and spinal canal: No prevertebral fluid or swelling. No visible canal hematoma. Disc levels: The spinal canal is quite generous. No large disc protrusions, spinal or significant foraminal stenosis. Upper chest: Apical pleural and parenchymal scarring changes. No apical mass. Other: Surgical changes from prior right thyroid lobe resection. Stable left-sided thyroid goiter. IMPRESSION: 1. Progressive age related cerebral atrophy, ventriculomegaly and periventricular white matter disease. 2. No acute intracranial findings or skull fracture. 3. Left posterior vertex scalp hematoma without underlying skull fracture. 4. Degenerative cervical spondylosis with multilevel disc disease and facet disease but no acute cervical spine fracture. 5. New mixed lytic and sclerotic process involving the T2 vertebral body with suspected pathologic fracture of the superior endplate and minimal retropulsion. MRI thoracic spine without and with contrast may be helpful for further evaluation. Electronically Signed   By: Rudie Meyer M.D.   On: 07/31/2021 15:24   CT Cervical Spine Wo Contrast  Result Date: 07/31/2021 CLINICAL DATA:  Larey Seat.  Hit head. EXAM: CT HEAD WITHOUT CONTRAST CT CERVICAL SPINE WITHOUT CONTRAST TECHNIQUE: Multidetector CT imaging of the head and cervical spine was performed following the standard protocol without intravenous contrast. Multiplanar CT image reconstructions of the cervical spine were also generated. COMPARISON:  Head CT 12/15/2016 FINDINGS: CT HEAD FINDINGS Brain: Progressive age related cerebral atrophy, ventriculomegaly and periventricular white matter disease. No extra-axial fluid collections are identified. No CT findings for acute hemispheric infarction or intracranial hemorrhage. No mass lesions. The brainstem and  cerebellum are normal.  Vascular: Stable vascular calcifications. No aneurysm hyperdense vessels. Skull: No acute skull fracture or bone lesion. Sinuses/Orbits: The paranasal sinuses and mastoid air cells are clear. The globes are intact. Other: There is a E scalp hematoma noted at the left posterior vertex. No underlying skull fracture. CT CERVICAL SPINE FINDINGS Alignment: Stable overall alignment of the cervical vertebral bodies. There is degenerative anterior subluxation C3, C4 and C5 due to severe facet disease. Skull base and vertebrae: No acute cervical spine fracture is identified. There is a new mixed lytic and sclerotic process involving the T2 vertebral body with suspected pathologic fracture of the superior endplate and minimal retropulsion. MRI thoracic spine without and with contrast may be helpful for further evaluation. Soft tissues and spinal canal: No prevertebral fluid or swelling. No visible canal hematoma. Disc levels: The spinal canal is quite generous. No large disc protrusions, spinal or significant foraminal stenosis. Upper chest: Apical pleural and parenchymal scarring changes. No apical mass. Other: Surgical changes from prior right thyroid lobe resection. Stable left-sided thyroid goiter. IMPRESSION: 1. Progressive age related cerebral atrophy, ventriculomegaly and periventricular white matter disease. 2. No acute intracranial findings or skull fracture. 3. Left posterior vertex scalp hematoma without underlying skull fracture. 4. Degenerative cervical spondylosis with multilevel disc disease and facet disease but no acute cervical spine fracture. 5. New mixed lytic and sclerotic process involving the T2 vertebral body with suspected pathologic fracture of the superior endplate and minimal retropulsion. MRI thoracic spine without and with contrast may be helpful for further evaluation. Electronically Signed   By: Rudie Meyer M.D.   On: 07/31/2021 15:24     Procedures  MDM   CT of  the head with no acute intracranial abnormalities or signs of skull fracture.  She does have a left scalp hematoma.  No acute fracture or traumatic injury to the cervical spine, incidental finding of a new mixed lytic and sclerotic lesion within the T2 vertebral body with suspected pathologic fracture of the superior endplate and very minimal retropulsion.  Patient does not have pain in this area does not have any focal neurologic deficits so feel this is appropriate for outpatient follow-up.  Patient has no known cancer history.  Placed oncology but asked patient to call and discuss this with her PCP Monday morning.  I discussed this incidental finding with patient and husband at bedside.  They expressed understanding.     Dartha Lodge, PA-C 07/31/21 2045    Milagros Loll, MD 08/01/21 [phone]

## 2021-07-31 NOTE — ED Notes (Signed)
Ambulated patient around nurses station, patient felt confident with ambulation. Patient stated her head just hurts

## 2021-08-02 ENCOUNTER — Telehealth: Payer: Self-pay | Admitting: Physician Assistant

## 2021-08-02 NOTE — Telephone Encounter (Signed)
Scheduled appt per 10/28 referral. Called pt, no answer. Left msg with appt date and time and also asked pt to call me back directly to confirm the appt. Scheduled with approval from RN Tomi.

## 2021-08-03 ENCOUNTER — Telehealth: Payer: Self-pay | Admitting: Oncology

## 2021-08-03 DIAGNOSIS — M542 Cervicalgia: Secondary | ICD-10-CM | POA: Diagnosis not present

## 2021-08-03 DIAGNOSIS — N390 Urinary tract infection, site not specified: Secondary | ICD-10-CM | POA: Diagnosis not present

## 2021-08-03 DIAGNOSIS — Z8673 Personal history of transient ischemic attack (TIA), and cerebral infarction without residual deficits: Secondary | ICD-10-CM | POA: Diagnosis not present

## 2021-08-03 DIAGNOSIS — Z8719 Personal history of other diseases of the digestive system: Secondary | ICD-10-CM | POA: Diagnosis not present

## 2021-08-03 DIAGNOSIS — R8290 Unspecified abnormal findings in urine: Secondary | ICD-10-CM | POA: Diagnosis not present

## 2021-08-03 DIAGNOSIS — M899 Disorder of bone, unspecified: Secondary | ICD-10-CM | POA: Diagnosis not present

## 2021-08-03 DIAGNOSIS — S0990XA Unspecified injury of head, initial encounter: Secondary | ICD-10-CM | POA: Diagnosis not present

## 2021-08-03 NOTE — Telephone Encounter (Signed)
[facility] Scheduling Phone Note  I contacted [name] regarding a referral from Jodi Geralds, PA-C (Emergency Medicine) for purpose of evaluation and work up of Lytic Bone Lesions.  [name] was aware of her referral and reason.  Information on reason for referral was not necessary.  Patient was informed about the Diagnostic Clinic and the intent being to complete a diagnostic/prognostic work-up for her suspicious findings.  She is aware her appointment is with our Physician Assistant to identify a diagnostic plan of care and to arrange further oncologist or specialist care as indicated.  I confirmed with [name] she has transportation to her Diagnostic Clinic appointment.  Patient is aware to bring a list of medications, as well as insurance cards.  Visitor policy reviewed with patient as well, including what to expect on arrival to the [facility].  Thank you for the referral, and we look forward to meeting [name] and completing her diagnostic work-up and arranging her subsequent appointment with our oncologist team.  Diagnostic Clinic Specific Info:  Best contact/way to contact patient:  [phone] [facility] updated to reflect?:  not applicable Is there a HC POA?:  No  Location of Diagnostic Clinic Appointment and Contact Info Provided to Patient: [facility] [address] [place], [place] [address] [phone]   Clinical referral information:  ED eval s/p fall.  D/c to home with f/u with PCP and oncology.   CT Head/CT C-Spine IMPRESSION: 1. Progressive age related cerebral atrophy, ventriculomegaly and periventricular white matter disease. 2. No acute intracranial findings or skull fracture. 3. Left posterior vertex scalp hematoma without underlying skull fracture. 4. Degenerative cervical spondylosis with multilevel disc disease and facet disease but no acute cervical spine  fracture. 5. New mixed lytic and sclerotic process involving the T2 vertebral body with suspected pathologic fracture of the superior endplate and minimal retropulsion. MRI thoracic spine without and with contrast may be helpful for further evaluation.

## 2021-08-09 ENCOUNTER — Encounter: Payer: Self-pay | Admitting: Physician Assistant

## 2021-08-09 ENCOUNTER — Inpatient Hospital Stay: Payer: Medicare PPO

## 2021-08-09 ENCOUNTER — Other Ambulatory Visit: Payer: Self-pay

## 2021-08-09 ENCOUNTER — Inpatient Hospital Stay: Payer: Medicare PPO | Attending: Physician Assistant | Admitting: Physician Assistant

## 2021-08-09 VITALS — BP 136/52 | HR 68 | Temp 98.5°F | Resp 16 | Ht 60.0 in | Wt 126.3 lb

## 2021-08-09 DIAGNOSIS — G9389 Other specified disorders of brain: Secondary | ICD-10-CM | POA: Diagnosis not present

## 2021-08-09 DIAGNOSIS — E079 Disorder of thyroid, unspecified: Secondary | ICD-10-CM | POA: Diagnosis not present

## 2021-08-09 DIAGNOSIS — N183 Chronic kidney disease, stage 3 unspecified: Secondary | ICD-10-CM | POA: Diagnosis not present

## 2021-08-09 DIAGNOSIS — M899 Disorder of bone, unspecified: Secondary | ICD-10-CM

## 2021-08-09 DIAGNOSIS — R5382 Chronic fatigue, unspecified: Secondary | ICD-10-CM | POA: Diagnosis not present

## 2021-08-09 DIAGNOSIS — Z8673 Personal history of transient ischemic attack (TIA), and cerebral infarction without residual deficits: Secondary | ICD-10-CM | POA: Diagnosis not present

## 2021-08-09 LAB — CMP (CANCER CENTER ONLY)
ALT: 8 U/L (ref 0–44)
AST: 14 U/L — ABNORMAL LOW (ref 15–41)
Albumin: 3.7 g/dL (ref 3.5–5.0)
Alkaline Phosphatase: 111 U/L (ref 38–126)
Anion gap: 8 (ref 5–15)
BUN: 22 mg/dL (ref 8–23)
CO2: 22 mmol/L (ref 22–32)
Calcium: 9.8 mg/dL (ref 8.9–10.3)
Chloride: 110 mmol/L (ref 98–111)
Creatinine: 1.6 mg/dL — ABNORMAL HIGH (ref 0.44–1.00)
GFR, Estimated: 32 mL/min — ABNORMAL LOW (ref >60)
Glucose, Bld: 90 mg/dL (ref 70–99)
Potassium: 4.4 mmol/L (ref 3.5–5.1)
Sodium: 140 mmol/L (ref 135–145)
Total Bilirubin: 0.4 mg/dL (ref 0.3–1.2)
Total Protein: 7.3 g/dL (ref 6.5–8.1)

## 2021-08-09 LAB — CBC WITH DIFFERENTIAL (CANCER CENTER ONLY)
Abs Immature Granulocytes: 0.01 10*3/uL (ref 0.00–0.07)
Basophils Absolute: 0.1 10*3/uL (ref 0.0–0.1)
Basophils Relative: 1 %
Eosinophils Absolute: 0.4 10*3/uL (ref 0.0–0.5)
Eosinophils Relative: 6 %
HCT: 39.5 % (ref 36.0–46.0)
Hemoglobin: 12.7 g/dL (ref 12.0–15.0)
Immature Granulocytes: 0 %
Lymphocytes Relative: 18 %
Lymphs Abs: 1.1 10*3/uL (ref 0.7–4.0)
MCH: 30.5 pg (ref 26.0–34.0)
MCHC: 32.2 g/dL (ref 30.0–36.0)
MCV: 95 fL (ref 80.0–100.0)
Monocytes Absolute: 0.7 10*3/uL (ref 0.1–1.0)
Monocytes Relative: 11 %
Neutro Abs: 3.9 10*3/uL (ref 1.7–7.7)
Neutrophils Relative %: 64 %
Platelet Count: 240 10*3/uL (ref 150–400)
RBC: 4.16 MIL/uL (ref 3.87–5.11)
RDW: 13.7 % (ref 11.5–15.5)
WBC Count: 6.1 10*3/uL (ref 4.0–10.5)
nRBC: 0 % (ref 0.0–0.2)

## 2021-08-09 LAB — LACTATE DEHYDROGENASE: LDH: 233 U/L — ABNORMAL HIGH (ref 98–192)

## 2021-08-09 NOTE — Progress Notes (Signed)
[place] [phone]   [phone]  INITIAL CONSULTATION:  Patient Care Team: Maurice Small, MD (Inactive) as PCP - General (Family Medicine)  CHIEF COMPLAINTS/PURPOSE OF CONSULTATION:  New mixed lytic and sclerotic lesion involving the T2 vertebral body  HISTORY OF PRESENTING ILLNESS:  [name] 85 y.o. female with medical history significant for chronic kidney disease stage III, hypertension, history of stroke, and hearing loss.  Patient is accompanied by her husband for this visit.  On review of the previous records, [name] presented to the emergency room on 07/31/2021 following a fall and trauma to the head.  CT imaging of the head and cervical spine was obtained that showed new mixed lytic and sclerotic process involving the T2 vertebral body with suspected pathologic fracture of the superior endplate and minimal retropulsion.  On exam today, [name] reports having chronic fatigue that she thought was due to her age.  She does stay in bed for several hours of the day.  Her appetite and weight have been stable.  She denies any nausea, vomiting or abdominal pain.  His occasional episodes of constipation but generally has a bowel movement every 2 to 3 days.  He denies easy bruising or signs of bleeding except for occasional nosebleeds.  She is currently on Plavix due to history of a stroke and easy bruising.  Patient has shortness of breath with exertion but none at rest.  She reports intermittent episodes of dizziness with no recent syncopal episode.  She reports chronic night sweats that have been present for several years.  She reports lower neck pain that radiates to both her shoulders. She notes the pain is constant and rates as 3-4 out of 10 on a pain scale. She takes tylenol that gives her mild improvement of pain.  She denies any fevers, chills, chest pain, cough, rash, peripheral edema, neuropathy.She has no  other complaints. Rest of the 10 point ROS is below.   MEDICAL HISTORY:  Past Medical History:  Diagnosis Date   Blood transfusion without reported diagnosis    CKD (chronic kidney disease), stage III ([place])    Hearing loss    pt wears hearing aid in both ears   Hypertension    PONV (postoperative nausea and vomiting)    nausea   Stroke ([place]) 06/2016   Thyroid disease     SURGICAL HISTORY: Past Surgical History:  Procedure Laterality Date   ABDOMINAL HYSTERECTOMY     CHOLECYSTECTOMY N/A 08/13/2015   Procedure: LAPAROSCOPIC CHOLECYSTECTOMY WITH INTRAOPERATIVE CHOLANGIOGRAM;  Surgeon: Jackolyn Confer, MD;  Location: [place];  Service: General;  Laterality: N/A;   ESOPHAGOGASTRODUODENOSCOPY N/A 06/14/2016   Procedure: ESOPHAGOGASTRODUODENOSCOPY (EGD);  Surgeon: Carol Ada, MD;  Location[facility] ENDOSCOPY;  Service: Endoscopy;  Laterality: N/A;   ESOPHAGOGASTRODUODENOSCOPY (EGD) WITH PROPOFOL N/A 01/24/2020   Procedure: ESOPHAGOGASTRODUODENOSCOPY (EGD) WITH PROPOFOL;  Surgeon: Carol Ada, MD;  Location: [facility] ENDOSCOPY;  Service: Endoscopy;  Laterality: N/A;   TEE WITHOUT CARDIOVERSION N/A 06/21/2016   Procedure: TRANSESOPHAGEAL ECHOCARDIOGRAM (TEE);  Surgeon: Lelon Perla, MD;  Location: [facility] ENDOSCOPY;  Service: Cardiovascular;  Laterality: N/A;   THYROIDECTOMY, PARTIAL      SOCIAL HISTORY: Social History   Socioeconomic History   Marital status: Married    Spouse name: Not on file   Number of children: Not on file   Years of education: Not on file   Highest education level: Not on file  Occupational History   Not on file  Tobacco Use   Smoking status: Never   Smokeless tobacco: Never  Substance and Sexual Activity   Alcohol use: No   Drug use: No   Sexual activity: Not on file  Other Topics Concern   Not on file  Social History Narrative   Not on file   Social Determinants of Health   Financial Resource Strain: Not on file  Food Insecurity: Not on file   Transportation Needs: Not on file  Physical Activity: Not on file  Stress: Not on file  Social Connections: Not on file  Intimate Partner Violence: Not on file    FAMILY HISTORY: Family History  Problem Relation Age of Onset   Stroke Mother    Hypertension Mother    Heart attack Father    Other Daughter        Still born    ALLERGIES:  is allergic to amoxicillin and meloxicam.  MEDICATIONS:  Current Outpatient Medications  Medication Sig Dispense Refill   busPIRone (BUSPAR) 5 MG tablet Take 5 mg by mouth 2 (two) times daily.     cephALEXin (KEFLEX) 500 MG capsule Take 500 mg by mouth 2 (two) times daily.     clopidogrel (PLAVIX) 75 MG tablet Restart Plavix 01/28/2020 30 tablet 1   estradiol (ESTRACE) 0.1 MG/GM vaginal cream Place vaginally.     loratadine (CLARITIN) 10 MG tablet Take by mouth.     LORazepam (ATIVAN) 0.5 MG tablet Take 0.5 mg by mouth 2 (two) times daily as needed for anxiety.     metoprolol tartrate (LOPRESSOR) 25 MG tablet Take 0.5 tablets (12.5 mg total) by mouth 2 (two) times daily. 90 tablet 3   ondansetron (ZOFRAN) 4 MG tablet Take 1 tablet (4 mg total) by mouth every 6 (six) hours as needed for nausea. 20 tablet 0   sertraline (ZOLOFT) 25 MG tablet Take 25 mg by mouth daily.     simvastatin (ZOCOR) 20 MG tablet TAKE 1 TABLET EVERY DAY AT BEDTIME (Patient taking differently: Take 20 mg by mouth daily at 6 PM.) 90 tablet 0   valsartan (DIOVAN) 160 MG tablet Take 1 tablet (160 mg total) by mouth daily. NEEDS APPOINTMENT FOR FUTURE REFILLS (Patient taking differently: Take 160 mg by mouth daily.) 90 tablet 0   venlafaxine XR (EFFEXOR-XR) 150 MG 24 hr capsule Take 150 mg by mouth daily.     amLODipine (NORVASC) 2.5 MG tablet TAKE 1 TABLET (2.5 MG TOTAL) BY MOUTH AT BEDTIME. 90 tablet 3   gabapentin (NEURONTIN) 100 MG capsule Take 1 capsule (100 mg total) by mouth 2 (two) times daily. (Patient not taking: Reported on 08/09/2021) 60 capsule 1   pantoprazole  (PROTONIX) 40 MG tablet Take 1 tablet (40 mg total) by mouth daily. 30 tablet 1   No current facility-administered medications for this visit.    REVIEW OF SYSTEMS:   Constitutional: ( - ) fevers, ( - )  chills , ( + ) night sweats Eyes: ( - ) blurriness of vision, ( - ) double vision, ( - ) watery eyes Ears, nose, mouth, throat, and face: ( - ) mucositis, ( - ) sore throat Respiratory: ( - ) cough, ( - ) dyspnea, ( - ) wheezes Cardiovascular: ( - ) palpitation, ( - ) chest discomfort, ( - ) lower extremity swelling Gastrointestinal:  ( - ) nausea, ( - ) heartburn, ( - ) change in bowel habits Skin: ( - ) abnormal skin rashes Lymphatics: ( - ) new lymphadenopathy, ( + )  easy bruising Neurological: ( - ) numbness, ( - ) tingling, ( - ) new weaknesses Behavioral/Psych: ( - ) mood change, ( - ) new changes  All other systems were reviewed with the patient and are negative.  PHYSICAL EXAMINATION: ECOG PERFORMANCE STATUS: 1 - Symptomatic but completely ambulatory  Vitals:   08/09/21 0947  BP: (!) 136/52  Pulse: 68  Resp: 16  Temp: 98.5 F (36.9 C)  SpO2: 96%   Filed Weights   08/09/21 0947  Weight: 126 lb 4.8 oz (57.3 kg)    GENERAL: well appearing female in NAD  SKIN: skin color, texture, turgor are normal, no rashes or significant lesions EYES: conjunctiva are pink and non-injected, sclera clear OROPHARYNX: no exudate, no erythema; lips, buccal mucosa, and tongue normal  NECK: supple, non-tender LYMPH:  no palpable lymphadenopathy in the cervical, axillary or supraclavicular lymph nodes.  LUNGS: clear to auscultation and percussion with normal breathing effort HEART: regular rate & rhythm and no murmurs and no lower extremity edema ABDOMEN: soft, non-tender, non-distended, normal bowel sounds Musculoskeletal: no cyanosis of digits and no clubbing  PSYCH: alert & oriented x 3, fluent speech NEURO: no focal motor/sensory deficits  LABORATORY DATA:  I have reviewed the data  as listed CBC Latest Ref Rng & Units 08/09/2021 01/25/2020 01/24/2020  WBC 4.0 - 10.5 K/uL 6.1 10.3 9.6  Hemoglobin 12.0 - 15.0 g/dL 12.7 9.5(L) 9.6(L)  Hematocrit 36.0 - 46.0 % 39.5 29.9(L) 30.1(L)  Platelets 150 - 400 K/uL 240 202 220    CMP Latest Ref Rng & Units 08/09/2021 01/25/2020 01/24/2020  Glucose 70 - 99 mg/dL 90 99 87  BUN 8 - 23 mg/dL 22 26(H) 48(H)  Creatinine 0.44 - 1.00 mg/dL 1.60(H) 0.90 1.11(H)  Sodium 135 - 145 mmol/L 140 142 146(H)  Potassium 3.5 - 5.1 mmol/L 4.4 3.6 3.9  Chloride 98 - 111 mmol/L 110 116(H) 119(H)  CO2 22 - 32 mmol/L 22 21(L) 20(L)  Calcium 8.9 - 10.3 mg/dL 9.8 8.5(L) 8.7(L)  Total Protein 6.5 - 8.1 g/dL 7.3 5.5(L) 5.4(L)  Total Bilirubin 0.3 - 1.2 mg/dL 0.4 0.7 0.3  Alkaline Phos 38 - 126 U/L 111 52 44  AST 15 - 41 U/L 14(L) 13(L) 13(L)  ALT 0 - 44 U/L 8 11 10      RADIOGRAPHIC STUDIES: I have personally reviewed the radiological images as listed and agreed with the findings in the report. CT HEAD WO CONTRAST (5MM)  Result Date: 07/31/2021 CLINICAL DATA:  [place].  Hit head. EXAM: CT HEAD WITHOUT CONTRAST CT CERVICAL SPINE WITHOUT CONTRAST TECHNIQUE: Multidetector CT imaging of the head and cervical spine was performed following the standard protocol without intravenous contrast. Multiplanar CT image reconstructions of the cervical spine were also generated. COMPARISON:  Head CT 12/15/2016 FINDINGS: CT HEAD FINDINGS Brain: Progressive age related cerebral atrophy, ventriculomegaly and periventricular white matter disease. No extra-axial fluid collections are identified. No CT findings for acute hemispheric infarction or intracranial hemorrhage. No mass lesions. The brainstem and cerebellum are normal. Vascular: Stable vascular calcifications. No aneurysm hyperdense vessels. Skull: No acute skull fracture or bone lesion. Sinuses/Orbits: The paranasal sinuses and mastoid air cells are clear. The globes are intact. Other: There is a E scalp hematoma noted at the  left posterior vertex. No underlying skull fracture. CT CERVICAL SPINE FINDINGS Alignment: Stable overall alignment of the cervical vertebral bodies. There is degenerative anterior subluxation C3, C4 and C5 due to severe facet disease. Skull base and vertebrae: No acute cervical spine  fracture is identified. There is a new mixed lytic and sclerotic process involving the T2 vertebral body with suspected pathologic fracture of the superior endplate and minimal retropulsion. MRI thoracic spine without and with contrast may be helpful for further evaluation. Soft tissues and spinal canal: No prevertebral fluid or swelling. No visible canal hematoma. Disc levels: The spinal canal is quite generous. No large disc protrusions, spinal or significant foraminal stenosis. Upper chest: Apical pleural and parenchymal scarring changes. No apical mass. Other: Surgical changes from prior right thyroid lobe resection. Stable left-sided thyroid goiter. IMPRESSION: 1. Progressive age related cerebral atrophy, ventriculomegaly and periventricular white matter disease. 2. No acute intracranial findings or skull fracture. 3. Left posterior vertex scalp hematoma without underlying skull fracture. 4. Degenerative cervical spondylosis with multilevel disc disease and facet disease but no acute cervical spine fracture. 5. New mixed lytic and sclerotic process involving the T2 vertebral body with suspected pathologic fracture of the superior endplate and minimal retropulsion. MRI thoracic spine without and with contrast may be helpful for further evaluation. Electronically Signed   By: Marijo Sanes M.D.   On: 07/31/2021 15:24   CT Cervical Spine Wo Contrast  Result Date: 07/31/2021 CLINICAL DATA:  [place].  Hit head. EXAM: CT HEAD WITHOUT CONTRAST CT CERVICAL SPINE WITHOUT CONTRAST TECHNIQUE: Multidetector CT imaging of the head and cervical spine was performed following the standard protocol without intravenous contrast. Multiplanar CT  image reconstructions of the cervical spine were also generated. COMPARISON:  Head CT 12/15/2016 FINDINGS: CT HEAD FINDINGS Brain: Progressive age related cerebral atrophy, ventriculomegaly and periventricular white matter disease. No extra-axial fluid collections are identified. No CT findings for acute hemispheric infarction or intracranial hemorrhage. No mass lesions. The brainstem and cerebellum are normal. Vascular: Stable vascular calcifications. No aneurysm hyperdense vessels. Skull: No acute skull fracture or bone lesion. Sinuses/Orbits: The paranasal sinuses and mastoid air cells are clear. The globes are intact. Other: There is a E scalp hematoma noted at the left posterior vertex. No underlying skull fracture. CT CERVICAL SPINE FINDINGS Alignment: Stable overall alignment of the cervical vertebral bodies. There is degenerative anterior subluxation C3, C4 and C5 due to severe facet disease. Skull base and vertebrae: No acute cervical spine fracture is identified. There is a new mixed lytic and sclerotic process involving the T2 vertebral body with suspected pathologic fracture of the superior endplate and minimal retropulsion. MRI thoracic spine without and with contrast may be helpful for further evaluation. Soft tissues and spinal canal: No prevertebral fluid or swelling. No visible canal hematoma. Disc levels: The spinal canal is quite generous. No large disc protrusions, spinal or significant foraminal stenosis. Upper chest: Apical pleural and parenchymal scarring changes. No apical mass. Other: Surgical changes from prior right thyroid lobe resection. Stable left-sided thyroid goiter. IMPRESSION: 1. Progressive age related cerebral atrophy, ventriculomegaly and periventricular white matter disease. 2. No acute intracranial findings or skull fracture. 3. Left posterior vertex scalp hematoma without underlying skull fracture. 4. Degenerative cervical spondylosis with multilevel disc disease and facet  disease but no acute cervical spine fracture. 5. New mixed lytic and sclerotic process involving the T2 vertebral body with suspected pathologic fracture of the superior endplate and minimal retropulsion. MRI thoracic spine without and with contrast may be helpful for further evaluation. Electronically Signed   By: Marijo Sanes M.D.   On: 07/31/2021 15:24    ASSESSMENT & PLAN SYMPHANI ECKSTROM is a female who presents to the clinic for evaluation for mixed lytic and sclerotic  lesion of the T2 vertebrae. I reviewed differentials including benign versus malignant processes. Patient will proceed with laboratory evaluation today and check CBC, CMP, SPEP with IFE, serum free light chains and LDH levels. If laboratory evaluation is unremarkable, we will pursue additional imaging with MRI of the thoracic spine and bone biopsy.   #Mixed lytic and sclerotic lesion of T2 vertebral body: --Labs today to check CBC, CMP, LDH, SPEP with IFE and sFLC --Need to consider MRI thoracic spine and biopsy if above workup is unremarkable --Plan to call patient with results and when to schedule a follow up.  Orders Placed This Encounter  Procedures   CBC with Differential ([place] Only)    Standing Status:   Future    Number of Occurrences:   1    Standing Expiration Date:   08/09/2022   CMP ([place] only)    Standing Status:   Future    Number of Occurrences:   1    Standing Expiration Date:   08/09/2022   Lactate dehydrogenase (LDH)    Standing Status:   Future    Number of Occurrences:   1    Standing Expiration Date:   08/09/2022   Multiple Myeloma Panel (SPEP&IFE w/QIG)    Standing Status:   Future    Number of Occurrences:   1    Standing Expiration Date:   08/09/2022   Kappa/lambda light chains    Standing Status:   Future    Number of Occurrences:   1    Standing Expiration Date:   08/09/2022    All questions were answered. The patient knows to call the clinic with any problems, questions or  concerns.  I have spent a total of 60 minutes minutes of face-to-face and non-face-to-face time, preparing to see the patient, obtaining and/or reviewing separately obtained history, performing a medically appropriate examination, counseling and educating the patient, ordering tests, documenting clinical information in the electronic health record,  and care coordination.   Dede Query, PA-C Department of Hematology/Oncology [place] at [facility] Phone: [phone]  Patient was seen with Dr. Lorenso Courier.   I have read the above note and personally examined the patient. I agree with the assessment and plan as noted above.  Briefly Mrs. Tait is a very pleasant 85 year old female with medical history significant for a lytic lesion of the T2 vertebra.  At this time the etiology of this lesion is unclear.  We will begin with serological studies to include SPEP and serum free light chains.  In the event these are unrevealing would recommend pursuing an MRI of the T spine to better characterize this lesion with consideration of a biopsy.  The patient voiced understanding of this plan moving forward.   Ledell Peoples, MD Department of Hematology/Oncology [place] at [facility] Phone: [phone] Pager: [phone] Email: [email or web address]

## 2021-08-10 LAB — KAPPA/LAMBDA LIGHT CHAINS
Kappa free light chain: 41.4 mg/L — ABNORMAL HIGH (ref 3.3–19.4)
Kappa, lambda light chain ratio: 1.68 — ABNORMAL HIGH (ref 0.26–1.65)
Lambda free light chains: 24.7 mg/L (ref 5.7–26.3)

## 2021-08-12 LAB — MULTIPLE MYELOMA PANEL, SERUM
Albumin SerPl Elph-Mcnc: 3.6 g/dL (ref 2.9–4.4)
Albumin/Glob SerPl: 1.2 (ref 0.7–1.7)
Alpha 1: 0.3 g/dL (ref 0.0–0.4)
Alpha2 Glob SerPl Elph-Mcnc: 0.9 g/dL (ref 0.4–1.0)
B-Globulin SerPl Elph-Mcnc: 1 g/dL (ref 0.7–1.3)
Gamma Glob SerPl Elph-Mcnc: 1 g/dL (ref 0.4–1.8)
Globulin, Total: 3.1 g/dL (ref 2.2–3.9)
IgA: 94 mg/dL (ref 64–422)
IgG (Immunoglobin G), Serum: 1014 mg/dL (ref 586–1602)
IgM (Immunoglobulin M), Srm: 74 mg/dL (ref 26–217)
Total Protein ELP: 6.7 g/dL (ref 6.0–8.5)

## 2021-08-13 ENCOUNTER — Telehealth: Payer: Self-pay | Admitting: Physician Assistant

## 2021-08-13 DIAGNOSIS — M899 Disorder of bone, unspecified: Secondary | ICD-10-CM

## 2021-08-13 NOTE — Telephone Encounter (Signed)
I spoke to patient's husband, Mr. [name] to review the lab results from 08/09/2021.  There was no evidence of monoclonal protein.  There is mild elevation of serum free light chain ratio which can be seen in the setting of chronic kidney disease.  CBC was unremarkable and CMP requires no intervention.  I discussed neck steps for diagnostic evaluation including obtaining an MRI of the thoracic spine to further characterize the T2 vertebral lesion.  Based on review of the MRI scan, we will determine if a bone biopsy is needed.

## 2021-08-16 ENCOUNTER — Ambulatory Visit (HOSPITAL_COMMUNITY)
Admission: RE | Admit: 2021-08-16 | Discharge: 2021-08-16 | Disposition: A | Discharge status: Home / Self Care | Payer: Medicare PPO | Source: Ambulatory Visit | Attending: Physician Assistant | Admitting: Physician Assistant

## 2021-08-16 ENCOUNTER — Telehealth: Payer: Self-pay

## 2021-08-16 DIAGNOSIS — D1800 Hemangioma unspecified site: Secondary | ICD-10-CM | POA: Diagnosis not present

## 2021-08-16 DIAGNOSIS — M899 Disorder of bone, unspecified: Secondary | ICD-10-CM

## 2021-08-16 DIAGNOSIS — S22020A Wedge compression fracture of second thoracic vertebra, initial encounter for closed fracture: Secondary | ICD-10-CM | POA: Diagnosis not present

## 2021-08-16 DIAGNOSIS — N281 Cyst of kidney, acquired: Secondary | ICD-10-CM | POA: Diagnosis not present

## 2021-08-16 DIAGNOSIS — M5124 Other intervertebral disc displacement, thoracic region: Secondary | ICD-10-CM | POA: Diagnosis not present

## 2021-08-16 MED ORDER — GADOBUTROL 1 MMOL/ML IV SOLN
6.5000 mL | Freq: Once | INTRAVENOUS | Status: AC | PRN
  Administered 2021-08-16: 6.5 mL via INTRAVENOUS

## 2021-08-16 NOTE — Telephone Encounter (Signed)
STAT MRI ordered today for pt. I have called Centralized Scheduling and first available is this evening at 9pm. I've called and spoken with the pt and she accepted this appt and knows to arrive at 8:45pm to [facility] Main entrance to check-in.

## 2021-08-18 ENCOUNTER — Telehealth: Payer: Self-pay | Admitting: Physician Assistant

## 2021-08-18 DIAGNOSIS — M899 Disorder of bone, unspecified: Secondary | ICD-10-CM

## 2021-08-18 NOTE — Telephone Encounter (Signed)
I spoke to patient's husband, Mr. [name], to review the MRI results. There is evidence of heterogeneous marrow signal intensity within the T2 vertebral body with associated fracture.  Additionally there is a 1 cm T1 hypointense enhancing lesion involving the T12 vertebral body.  Discussed results with Dr. Leonides Schanz who recommended proceeding with a bone biopsy since serologic evaluation has been unremarkable.   I will request a consultation with interventional radiology.  Patient and her husband would like to meet with IR and review the process for the bone biopsy before making a decision.  If patient decides not to pursue a bone biopsy, we will monitor closely and repeat imaging in 3 months.

## 2021-08-23 ENCOUNTER — Other Ambulatory Visit: Payer: Self-pay | Admitting: Physician Assistant

## 2021-08-23 DIAGNOSIS — M899 Disorder of bone, unspecified: Secondary | ICD-10-CM

## 2021-08-31 ENCOUNTER — Telehealth (HOSPITAL_COMMUNITY): Payer: Self-pay

## 2021-08-31 NOTE — Telephone Encounter (Signed)
-----   Message from Baldemar Lenis, MD sent at 08/31/2021 11:44 AM EST ----- Regarding: RE: Bone biopsy Hi Ash,   Can do but needs to hold plavix for at least 5 days.  Thanks, Georgiann Hahn  ----- Message ----- From: Anderson Malta Sent: 08/31/2021  11:25 AM EST To: Baldemar Lenis, MD Subject: Bone biopsy                                    Kat,   New order for a bone biopsy. Please review. T2 vertebral body bone lesion with pathologic fracture. She is on plavix. MR thoracic is in epic.   Thanks,  Fara Boros

## 2021-09-06 ENCOUNTER — Other Ambulatory Visit: Payer: Self-pay | Admitting: Radiology

## 2021-09-07 ENCOUNTER — Other Ambulatory Visit: Payer: Self-pay | Admitting: Physician Assistant

## 2021-09-07 ENCOUNTER — Other Ambulatory Visit: Payer: Self-pay

## 2021-09-07 ENCOUNTER — Ambulatory Visit (HOSPITAL_COMMUNITY)
Admission: RE | Admit: 2021-09-07 | Discharge: 2021-09-07 | Disposition: A | Discharge status: Home / Self Care | Payer: Medicare PPO | Source: Ambulatory Visit | Attending: Physician Assistant | Admitting: Physician Assistant

## 2021-09-07 ENCOUNTER — Encounter (HOSPITAL_COMMUNITY): Payer: Self-pay

## 2021-09-07 DIAGNOSIS — M899 Disorder of bone, unspecified: Secondary | ICD-10-CM | POA: Diagnosis not present

## 2021-09-07 DIAGNOSIS — N183 Chronic kidney disease, stage 3 unspecified: Secondary | ICD-10-CM | POA: Diagnosis not present

## 2021-09-07 DIAGNOSIS — I129 Hypertensive chronic kidney disease with stage 1 through stage 4 chronic kidney disease, or unspecified chronic kidney disease: Secondary | ICD-10-CM | POA: Insufficient documentation

## 2021-09-07 DIAGNOSIS — Z8673 Personal history of transient ischemic attack (TIA), and cerebral infarction without residual deficits: Secondary | ICD-10-CM | POA: Diagnosis not present

## 2021-09-07 DIAGNOSIS — M898X8 Other specified disorders of bone, other site: Secondary | ICD-10-CM | POA: Diagnosis not present

## 2021-09-07 DIAGNOSIS — M8448XA Pathological fracture, other site, initial encounter for fracture: Secondary | ICD-10-CM | POA: Diagnosis not present

## 2021-09-07 HISTORY — PX: IR FLUORO GUIDED NEEDLE PLC ASPIRATION/INJECTION LOC: IMG2395

## 2021-09-07 LAB — CBC
HCT: 42.1 % (ref 36.0–46.0)
Hemoglobin: 13.7 g/dL (ref 12.0–15.0)
MCH: 30.9 pg (ref 26.0–34.0)
MCHC: 32.5 g/dL (ref 30.0–36.0)
MCV: 94.8 fL (ref 80.0–100.0)
Platelets: 248 10*3/uL (ref 150–400)
RBC: 4.44 MIL/uL (ref 3.87–5.11)
RDW: 13 % (ref 11.5–15.5)
WBC: 5.2 10*3/uL (ref 4.0–10.5)
nRBC: 0 % (ref 0.0–0.2)

## 2021-09-07 LAB — PROTIME-INR
INR: 0.9 (ref 0.8–1.2)
Prothrombin Time: 12 seconds (ref 11.4–15.2)

## 2021-09-07 MED ORDER — VANCOMYCIN HCL IN DEXTROSE 1-5 GM/200ML-% IV SOLN
INTRAVENOUS | Status: AC
  Filled 2021-09-07: qty 200

## 2021-09-07 MED ORDER — BUPIVACAINE HCL (PF) 0.5 % IJ SOLN
INTRAMUSCULAR | Status: AC
  Filled 2021-09-07: qty 30

## 2021-09-07 MED ORDER — LIDOCAINE HCL 1 % IJ SOLN
INTRAMUSCULAR | Status: AC
  Filled 2021-09-07: qty 20

## 2021-09-07 MED ORDER — GELATIN ABSORBABLE 12-7 MM EX MISC
CUTANEOUS | Status: AC
  Filled 2021-09-07: qty 1

## 2021-09-07 MED ORDER — FENTANYL CITRATE (PF) 100 MCG/2ML IJ SOLN
INTRAMUSCULAR | Status: AC | PRN
  Administered 2021-09-07 (×3): 25 ug via INTRAVENOUS

## 2021-09-07 MED ORDER — FENTANYL CITRATE (PF) 100 MCG/2ML IJ SOLN
INTRAMUSCULAR | Status: AC
  Filled 2021-09-07: qty 4

## 2021-09-07 MED ORDER — LIDOCAINE HCL (PF) 1 % IJ SOLN
INTRAMUSCULAR | Status: AC | PRN
  Administered 2021-09-07: 10 mL

## 2021-09-07 MED ORDER — MIDAZOLAM HCL 2 MG/2ML IJ SOLN
INTRAMUSCULAR | Status: AC
  Filled 2021-09-07: qty 4

## 2021-09-07 MED ORDER — BUPIVACAINE HCL (PF) 0.5 % IJ SOLN
INTRAMUSCULAR | Status: AC | PRN
  Administered 2021-09-07: 10 mL

## 2021-09-07 MED ORDER — VANCOMYCIN HCL IN DEXTROSE 1-5 GM/200ML-% IV SOLN
INTRAVENOUS | Status: AC | PRN
  Administered 2021-09-07: 1000 mg via INTRAVENOUS

## 2021-09-07 MED ORDER — MIDAZOLAM HCL 2 MG/2ML IJ SOLN
INTRAMUSCULAR | Status: AC | PRN
  Administered 2021-09-07 (×3): .5 mg via INTRAVENOUS

## 2021-09-07 MED ORDER — SODIUM CHLORIDE 0.9 % IV SOLN: INTRAVENOUS | Status: DC

## 2021-09-07 MED ORDER — ACETAMINOPHEN 325 MG PO TABS: 650.0000 mg | ORAL_TABLET | Freq: Four times a day (QID) | ORAL | Status: DC | PRN

## 2021-09-07 NOTE — Progress Notes (Signed)
Patient and husband was given discharge instructions. Both verbalized understanding. 

## 2021-09-07 NOTE — H&P (Signed)
Chief Complaint: Patient was seen in consultation today for image guided T2 vertebral body bone biopsy at the request of Thayil,Irene T  Referring Physician(s): Briant Cedar  Supervising Physician: Baldemar Lenis  Patient Status: [facility] - Out-pt  History of Present Illness: [name] is a 85 y.o. female with PMHs of HTN, CVA, CKD stage III and who presented to ED on 07/31/21 due to fall, CT C spine showed incidental finding of mixed lytic and sclerotic process involving the T2 vertebral body with suspected pathologic fracture of the superior endplate and minimal retropulsion.  Subsequent MRI of T spine on 08/17/21 showed:  1. Heterogeneous marrow signal intensity within the T2 vertebral body with superimposed fracture. Associated height loss of up to 40% without bony retropulsion. Finding demonstrates indeterminate features by MRI, but is concerning for possible pathologic fracture given the overall heterogeneous appearance. Correlation with laboratory values and history recommended. Additionally, a short interval follow-up scan may be helpful to evaluate for evolutionary changes as warranted. 2. Additional 1 cm T1 hypointense enhancing lesion involving the T12 vertebral body as above, also indeterminate. Attention at follow-up recommended. 3. Probable additional acute to subacute compression fracture at the superior endplate of T1 with associated mild 10-20% height loss without bony retropulsion. This is benign/mechanical in appearance. 4. Mild chronic compression deformities of T3 and T4 without retropulsion. 5. Small central disc protrusion at T4-5 without stenosis or impingement.  Patient was referred to [facility] NIR for T2 vertebral body bone bx.  Case was reviewed and approved by Dr. Tommie Sams, patient presents to [facility] IR today for the procedure.   Patient laying in bed, not in acute distress.  Denise headache, fever, chills, shortness of breath,  cough, chest pain, abdominal pain, nausea ,vomiting, and bleeding.    Past Medical History:  Diagnosis Date   Blood transfusion without reported diagnosis    CKD (chronic kidney disease), stage III (HCC)    Hearing loss    pt wears hearing aid in both ears   Hypertension    PONV (postoperative nausea and vomiting)    nausea   Stroke (HCC) 06/2016   Thyroid disease     Past Surgical History:  Procedure Laterality Date   ABDOMINAL HYSTERECTOMY     CHOLECYSTECTOMY N/A 08/13/2015   Procedure: LAPAROSCOPIC CHOLECYSTECTOMY WITH INTRAOPERATIVE CHOLANGIOGRAM;  Surgeon: Avel Peace, MD;  Location: [facility] OR;  Service: General;  Laterality: N/A;   ESOPHAGOGASTRODUODENOSCOPY N/A 06/14/2016   Procedure: ESOPHAGOGASTRODUODENOSCOPY (EGD);  Surgeon: Jeani Hawking, MD;  Location: [facility] ENDOSCOPY;  Service: Endoscopy;  Laterality: N/A;   ESOPHAGOGASTRODUODENOSCOPY (EGD) WITH PROPOFOL N/A 01/24/2020   Procedure: ESOPHAGOGASTRODUODENOSCOPY (EGD) WITH PROPOFOL;  Surgeon: Jeani Hawking, MD;  Location: [facility] ENDOSCOPY;  Service: Endoscopy;  Laterality: N/A;   TEE WITHOUT CARDIOVERSION N/A 06/21/2016   Procedure: TRANSESOPHAGEAL ECHOCARDIOGRAM (TEE);  Surgeon: Lewayne Bunting, MD;  Location: [facility] ENDOSCOPY;  Service: Cardiovascular;  Laterality: N/A;   THYROIDECTOMY, PARTIAL      Allergies: Amoxicillin and Nsaids  Medications: Prior to Admission medications   Medication Sig Start Date End Date Taking? Authorizing Provider  amLODipine (NORVASC) 2.5 MG tablet TAKE 1 TABLET (2.5 MG TOTAL) BY MOUTH AT BEDTIME. 04/07/21 09/02/21 Yes Lennette Bihari, MD  Carboxymethylcellul-Glycerin (LUBRICATING EYE DROPS OP) Place 1 drop into both eyes daily as needed (dry eyes).   Yes [provider]  metoprolol tartrate (LOPRESSOR) 25 MG tablet Take 0.5 tablets (12.5 mg total) by mouth 2 (two) times daily. Patient taking differently: Take 25  mg by mouth 2 (two) times daily. 01/13/17  Yes Troy Sine, MD  OVER THE  COUNTER MEDICATION Take 2 capsules by mouth at bedtime as needed (sleep). relaxium   Yes [provider]  pantoprazole (PROTONIX) 40 MG tablet Take 1 tablet (40 mg total) by mouth daily. 01/25/20 09/07/21 Yes Georgette Shell, MD  simvastatin (ZOCOR) 20 MG tablet TAKE 1 TABLET EVERY DAY AT BEDTIME Patient taking differently: Take 20 mg by mouth daily at 6 PM. 09/15/17  Yes Rosalin Hawking, MD  valsartan (DIOVAN) 160 MG tablet Take 1 tablet (160 mg total) by mouth daily. NEEDS APPOINTMENT FOR FUTURE REFILLS Patient taking differently: Take 160 mg by mouth daily. 04/01/19  Yes Troy Sine, MD  venlafaxine XR (EFFEXOR-XR) 150 MG 24 hr capsule Take 150 mg by mouth daily. 01/05/20  Yes [provider]  cetirizine (ZYRTEC) 10 MG tablet Take 10 mg by mouth daily.    [provider]  clopidogrel (PLAVIX) 75 MG tablet Restart Plavix 01/28/2020 01/25/20   Georgette Shell, MD  diphenhydrAMINE (BENADRYL) 25 MG tablet Take 25 mg by mouth at bedtime as needed for allergies.    [provider]     Family History  Problem Relation Age of Onset   Stroke Mother    Hypertension Mother    Heart attack Father    Other Daughter        Still born    Social History   Socioeconomic History   Marital status: Married    Spouse name: Not on file   Number of children: Not on file   Years of education: Not on file   Highest education level: Not on file  Occupational History   Not on file  Tobacco Use   Smoking status: Never   Smokeless tobacco: Never  Substance and Sexual Activity   Alcohol use: No   Drug use: No   Sexual activity: Not on file  Other Topics Concern   Not on file  Social History Narrative   Not on file   Social Determinants of Health   Financial Resource Strain: Not on file  Food Insecurity: Not on file  Transportation Needs: Not on file  Physical Activity: Not on file  Stress: Not on file  Social Connections: Not on file     Review of  Systems: A 12 point ROS discussed and pertinent positives are indicated in the HPI above.  All other systems are negative.  Vital Signs: BP (!) 181/77   Pulse (!) 57   Temp 98.2 F (36.8 C) (Oral)   Resp 15   Ht 5' (1.524 m)   Wt 120 lb (54.4 kg)   LMP  (LMP Unknown)   SpO2 98%   BMI 23.44 kg/m    Physical Exam Vitals and nursing note reviewed.  Constitutional:      General: Patient is not in acute distress.    Appearance: Normal appearance. Patient is not ill-appearing.  HENT:     Head: Normocephalic and atraumatic.     Mouth/Throat:     Mouth: Mucous membranes are moist.     Pharynx: Oropharynx is clear.  Cardiovascular:     Rate and Rhythm: Normal rate and regular rhythm.     Pulses: Normal pulses.     Heart sounds: Normal heart sounds.  Pulmonary:     Effort: Pulmonary effort is normal.     Breath sounds: Normal breath sounds.  Abdominal:     General: Abdomen is flat.  Bowel sounds are normal.     Palpations: Abdomen is soft.  Musculoskeletal:     Cervical back: Neck supple.  Skin:    General: Skin is warm and dry.     Coloration: Skin is not jaundiced or pale.  Neurological:     Mental Status: Patient is alert and oriented to person, place, and time.  Psychiatric:        Mood and Affect: Mood normal.        Behavior: Behavior normal.        Judgment: Judgment normal.    MD Evaluation Airway: WNL Heart: WNL Abdomen: WNL Chest/ Lungs: WNL ASA  Classification: 2 Mallampati/Airway Score: Two  Imaging: MR THORACIC SPINE W WO CONTRAST  Result Date: 08/17/2021 CLINICAL DATA:  Initial evaluation for mixed sclerotic lesion involving the T2 vertebral body. EXAM: MRI THORACIC WITHOUT AND WITH CONTRAST TECHNIQUE: Multiplanar and multiecho pulse sequences of the thoracic spine were obtained without and with intravenous contrast. CONTRAST:  6.42mL GADAVIST GADOBUTROL 1 MMOL/ML IV SOLN COMPARISON:  Prior CT from 07/31/2021. FINDINGS: Alignment: Physiologic with  preservation of the normal thoracic kyphosis. No significant listhesis. Vertebrae: Heterogeneous T1/T2 hypointense, with STIR hyperintense signal abnormality seen within the T2 vertebral body. Associated irregular heterogeneous postcontrast enhancement. There is a superimposed compression fracture with associated height loss measuring up to 40% without bony retropulsion. Finding demonstrates indeterminate features by MRI, but is concerning for an evolving pathologic fracture given overall heterogeneous appearance. No abnormal extra osseous soft tissue density or enhancement. Mild height loss with associated enhancement involving the adjacent superior endplate of T1 also suspicious for a possible evolving acute to subacute compression fracture (series 17, image 7). No more than mild 20% height loss without bony retropulsion. Additional mild chronic compression deformities noted involving the superior endplates of T3 and T4. Vertebral body height otherwise maintained. Underlying bone marrow signal intensity is diffusely heterogeneous. Few scattered benign hemangiomata noted, most prominent of which measures 14 mm at the L1 vertebral body. There is an additional 1 cm T1/T2 hypointense, stir hyperintense enhancing lesion within the posterior aspect of T12, indeterminate (series 23, image 5). No other definite worrisome osseous lesions by MRI. Cord: Normal signal and morphology. No abnormal enhancement. No epidural or intradural tumor. Paraspinal and other soft tissues: Paraspinous soft tissues demonstrate no acute finding. Right thyroid lobe appears to be absent. 1 cm left thyroid nodule noted (series 19, image 5), of doubtful significance given size and patient age, no follow-up imaging recommended (ref: J Am Coll Radiol. 2015 Feb;12(2): 143-50).11 mm benign appearing exophytic cyst partially visualized extending from the posterior right kidney. Disc levels: T4-5: Small central disc protrusion mildly indents the  ventral thecal sac (series 19, image 14). No significant spinal stenosis or cord deformity. Foramina remain patent. Otherwise, relatively mild for age disc desiccation with minor noncompressive disc bulging elsewhere within the thoracic spine. No other significant focal disc protrusion. No significant spinal stenosis. Foramina remain patent. IMPRESSION: 1. Heterogeneous marrow signal intensity within the T2 vertebral body with superimposed fracture. Associated height loss of up to 40% without bony retropulsion. Finding demonstrates indeterminate features by MRI, but is concerning for possible pathologic fracture given the overall heterogeneous appearance. Correlation with laboratory values and history recommended. Additionally, a short interval follow-up scan may be helpful to evaluate for evolutionary changes as warranted. 2. Additional 1 cm T1 hypointense enhancing lesion involving the T12 vertebral body as above, also indeterminate. Attention at follow-up recommended. 3. Probable additional acute to subacute compression fracture  at the superior endplate of T1 with associated mild 10-20% height loss without bony retropulsion. This is benign/mechanical in appearance. 4. Mild chronic compression deformities of T3 and T4 without retropulsion. 5. Small central disc protrusion at T4-5 without stenosis or impingement. Electronically Signed   By: Jeannine Boga M.D.   On: 08/17/2021 00:19    Labs:  CBC: Recent Labs    08/09/21 1100 09/07/21 0716  WBC 6.1 5.2  HGB 12.7 13.7  HCT 39.5 42.1  PLT 240 248    COAGS: Recent Labs    09/07/21 0716  INR 0.9    BMP: Recent Labs    08/09/21 1100  NA 140  K 4.4  CL 110  CO2 22  GLUCOSE 90  BUN 22  CALCIUM 9.8  CREATININE 1.60*  GFRNONAA 32*    LIVER FUNCTION TESTS: Recent Labs    08/09/21 1100  BILITOT 0.4  AST 14*  ALT 8  ALKPHOS 111  PROT 7.3  ALBUMIN 3.7    TUMOR MARKERS: No results for input(s): AFPTM, CEA, CA199, CHROMGRNA  in the last 8760 hours.  Assessment and Plan: 85 y.o. female with recent finding of T2 vertebral body lytic lesion with possible pathologic fx, who was referred to [place] for T2 vertebral body bx.   Case was reviewed and approved by Dr. Karenann Cai, patient presents to [facility] IR today for the procedure.  NPO since MN VS hypertensive 181/77 - pt on amlodipine and metoprolol, pt took her medicine this morning - informed Dr. Derrel Nip, ok to proceed  CBC all w/in normal limit  INR 0.9  On Plavix, last taken on Thursday 09/02/21  Risks and benefits of T2 vertebral body bone bx was discussed with the patient and/or patient's family including, but not limited to bleeding, infection, damage to adjacent structures or low yield requiring additional tests.  All of the questions were answered and there is agreement to proceed.  Consent signed and in chart.   Thank you for this interesting consult.  I greatly enjoyed meeting [name] and look forward to participating in their care.  A copy of this report was sent to the requesting provider on this date.  Electronically Signed: Tera Mater, PA-C 09/07/2021, 8:27 AM   I spent a total of  40 Minutes   in face to face in clinical consultation, greater than 50% of which was counseling/coordinating care for T2 vertebral body bone bx   This chart was dictated using voice recognition software.  Despite best efforts to proofread,  errors can occur which can change the documentation meaning.

## 2021-09-10 LAB — SURGICAL PATHOLOGY

## 2021-09-13 ENCOUNTER — Telehealth: Payer: Self-pay | Admitting: *Deleted

## 2021-09-13 ENCOUNTER — Telehealth: Payer: Self-pay | Admitting: Physician Assistant

## 2021-09-13 NOTE — Telephone Encounter (Signed)
I called [name] to review the results of the T2 vertebral body biopsy from 09/07/2021. Findings indicated benign bone and marrow tissue. No lesion was identified.   We recommend to follow up with PCP to manage symptoms of the pain associated with the fracture. Additionally, there was recommendation to monitor with serial imaging a 1 cm enhancing lesion involving the T12 vertebral body. Future appts in our clinic will be pending.   Patient expressed understanding and satisfaction with the plan provided.

## 2021-09-13 NOTE — Telephone Encounter (Signed)
Last office note, phone call , MRI and path report fax'd to Dr. [name]

## 2021-11-03 DIAGNOSIS — G479 Sleep disorder, unspecified: Secondary | ICD-10-CM | POA: Diagnosis not present

## 2021-11-03 DIAGNOSIS — F331 Major depressive disorder, recurrent, moderate: Secondary | ICD-10-CM | POA: Diagnosis not present

## 2022-03-27 ENCOUNTER — Other Ambulatory Visit: Payer: Self-pay | Admitting: Cardiovascular Disease

## 2022-04-06 DIAGNOSIS — N1831 Chronic kidney disease, stage 3a: Secondary | ICD-10-CM | POA: Diagnosis not present

## 2022-04-06 DIAGNOSIS — F331 Major depressive disorder, recurrent, moderate: Secondary | ICD-10-CM | POA: Diagnosis not present

## 2022-04-06 DIAGNOSIS — I10 Essential (primary) hypertension: Secondary | ICD-10-CM | POA: Diagnosis not present

## 2022-04-06 DIAGNOSIS — Z8673 Personal history of transient ischemic attack (TIA), and cerebral infarction without residual deficits: Secondary | ICD-10-CM | POA: Diagnosis not present

## 2022-04-06 DIAGNOSIS — E782 Mixed hyperlipidemia: Secondary | ICD-10-CM | POA: Diagnosis not present

## 2022-04-19 DIAGNOSIS — N390 Urinary tract infection, site not specified: Secondary | ICD-10-CM | POA: Diagnosis not present

## 2022-04-21 DIAGNOSIS — H903 Sensorineural hearing loss, bilateral: Secondary | ICD-10-CM | POA: Diagnosis not present

## 2022-05-16 DIAGNOSIS — H903 Sensorineural hearing loss, bilateral: Secondary | ICD-10-CM | POA: Diagnosis not present

## 2022-05-31 ENCOUNTER — Other Ambulatory Visit: Payer: Self-pay | Admitting: Nephrology

## 2022-05-31 DIAGNOSIS — G8929 Other chronic pain: Secondary | ICD-10-CM

## 2022-05-31 DIAGNOSIS — I129 Hypertensive chronic kidney disease with stage 1 through stage 4 chronic kidney disease, or unspecified chronic kidney disease: Secondary | ICD-10-CM | POA: Diagnosis not present

## 2022-05-31 DIAGNOSIS — N281 Cyst of kidney, acquired: Secondary | ICD-10-CM

## 2022-05-31 DIAGNOSIS — N1832 Chronic kidney disease, stage 3b: Secondary | ICD-10-CM

## 2022-05-31 DIAGNOSIS — R519 Headache, unspecified: Secondary | ICD-10-CM | POA: Diagnosis not present

## 2022-05-31 DIAGNOSIS — I5032 Chronic diastolic (congestive) heart failure: Secondary | ICD-10-CM | POA: Diagnosis not present

## 2022-06-01 ENCOUNTER — Ambulatory Visit
Admission: RE | Admit: 2022-06-01 | Discharge: 2022-06-01 | Disposition: A | Discharge status: Home / Self Care | Payer: Medicare PPO | Source: Ambulatory Visit | Attending: Nephrology | Admitting: Nephrology

## 2022-06-01 DIAGNOSIS — N281 Cyst of kidney, acquired: Secondary | ICD-10-CM

## 2022-06-01 DIAGNOSIS — R519 Headache, unspecified: Secondary | ICD-10-CM

## 2022-06-01 DIAGNOSIS — N1832 Chronic kidney disease, stage 3b: Secondary | ICD-10-CM

## 2022-06-01 DIAGNOSIS — I5032 Chronic diastolic (congestive) heart failure: Secondary | ICD-10-CM

## 2022-06-01 DIAGNOSIS — N189 Chronic kidney disease, unspecified: Secondary | ICD-10-CM | POA: Diagnosis not present

## 2022-07-29 DIAGNOSIS — C4441 Basal cell carcinoma of skin of scalp and neck: Secondary | ICD-10-CM | POA: Diagnosis not present

## 2022-07-29 DIAGNOSIS — L570 Actinic keratosis: Secondary | ICD-10-CM | POA: Diagnosis not present

## 2022-07-29 DIAGNOSIS — L578 Other skin changes due to chronic exposure to nonionizing radiation: Secondary | ICD-10-CM | POA: Diagnosis not present

## 2022-07-29 DIAGNOSIS — D0462 Carcinoma in situ of skin of left upper limb, including shoulder: Secondary | ICD-10-CM | POA: Diagnosis not present

## 2022-07-29 DIAGNOSIS — D485 Neoplasm of uncertain behavior of skin: Secondary | ICD-10-CM | POA: Diagnosis not present

## 2022-08-08 ENCOUNTER — Emergency Department (HOSPITAL_BASED_OUTPATIENT_CLINIC_OR_DEPARTMENT_OTHER)
Admission: EM | Admit: 2022-08-08 | Discharge: 2022-08-08 | Disposition: A | Discharge status: Home / Self Care | Payer: Medicare PPO | Attending: Emergency Medicine | Admitting: Emergency Medicine

## 2022-08-08 ENCOUNTER — Encounter (HOSPITAL_BASED_OUTPATIENT_CLINIC_OR_DEPARTMENT_OTHER): Payer: Self-pay

## 2022-08-08 ENCOUNTER — Emergency Department (HOSPITAL_BASED_OUTPATIENT_CLINIC_OR_DEPARTMENT_OTHER): Payer: Medicare PPO

## 2022-08-08 ENCOUNTER — Other Ambulatory Visit: Payer: Self-pay

## 2022-08-08 DIAGNOSIS — W0110XA Fall on same level from slipping, tripping and stumbling with subsequent striking against unspecified object, initial encounter: Secondary | ICD-10-CM | POA: Diagnosis not present

## 2022-08-08 DIAGNOSIS — Y92512 Supermarket, store or market as the place of occurrence of the external cause: Secondary | ICD-10-CM | POA: Insufficient documentation

## 2022-08-08 DIAGNOSIS — W19XXXA Unspecified fall, initial encounter: Secondary | ICD-10-CM

## 2022-08-08 DIAGNOSIS — Z7902 Long term (current) use of antithrombotics/antiplatelets: Secondary | ICD-10-CM | POA: Diagnosis not present

## 2022-08-08 DIAGNOSIS — S0990XA Unspecified injury of head, initial encounter: Secondary | ICD-10-CM | POA: Diagnosis not present

## 2022-08-08 DIAGNOSIS — S0003XA Contusion of scalp, initial encounter: Secondary | ICD-10-CM | POA: Insufficient documentation

## 2022-08-08 NOTE — ED Triage Notes (Signed)
Pt fell approx 2pm, tripped and fell at grocery store hitting her head.  Pt is on Plavix Pt does have a "knot on top of head" ice applied

## 2022-08-08 NOTE — ED Provider Notes (Signed)
[facility] [facility] EMERGENCY DEPT Provider Note   CSN: [id number] Arrival date & time: 08/08/22  1444     History Chief Complaint  Patient presents with   Fall    HPI [name] is a 86 y.o. female presenting for ground-level fall.  Patient was at the grocery store in the parking lot when she lost her balance and fell backwards.  She has a hematoma on her right posterior scalp.  She denies fevers or chills, nausea vomiting, syncope shortness of breath.  Patient takes Plavix.  No loss of consciousness.  Has been approximately 6 hours since the fall..   Patient's recorded medical, surgical, social, medication list and allergies were reviewed in the Snapshot window as part of the initial history.   Review of Systems   Review of Systems  Constitutional:  Negative for chills and fever.  HENT:  Negative for ear pain and sore throat.   Eyes:  Negative for pain and visual disturbance.  Respiratory:  Negative for cough and shortness of breath.   Cardiovascular:  Negative for chest pain and palpitations.  Gastrointestinal:  Negative for abdominal pain and vomiting.  Genitourinary:  Negative for dysuria and hematuria.  Musculoskeletal:  Negative for arthralgias and back pain.  Skin:  Negative for color change and rash.  Neurological:  Negative for seizures and syncope.  All other systems reviewed and are negative.   Physical Exam Updated Vital Signs BP (!) 164/93   Pulse 69   Temp 97.7 F (36.5 C)   Resp 17   Ht 5' (1.524 m)   Wt 54.4 kg   LMP  (LMP Unknown)   SpO2 100%   BMI 23.44 kg/m  Physical Exam Vitals and nursing note reviewed.  Constitutional:      General: She is not in acute distress.    Appearance: She is well-developed.  HENT:     Head: Normocephalic and atraumatic.  Eyes:     Conjunctiva/sclera: Conjunctivae normal.  Cardiovascular:     Rate and Rhythm: Normal rate and regular rhythm.     Heart sounds: No murmur heard. Pulmonary:     Effort:  Pulmonary effort is normal. No respiratory distress.     Breath sounds: Normal breath sounds.  Abdominal:     Palpations: Abdomen is soft.     Tenderness: There is no abdominal tenderness.  Musculoskeletal:        General: Signs of injury (Bruising to the posterior scalp.  No other injuries appreciated.) present. No swelling.     Cervical back: Neck supple.  Skin:    General: Skin is warm and dry.     Capillary Refill: Capillary refill takes less than 2 seconds.  Neurological:     Mental Status: She is alert.  Psychiatric:        Mood and Affect: Mood normal.      ED Course/ Medical Decision Making/ A&P    Procedures Procedures   Medications Ordered in ED Medications - No data to display Medical Decision Making:    [name] is a 86 y.o. female who presented to the ED today with a moderate mechanisma trauma, detailed above.    Patient's presentation is complicated by their history of blood thinner use.  Patient placed on continuous vitals and telemetry monitoring while in ED which was reviewed periodically.   Given this mechanism of trauma, a full physical exam was performed. Notably, patient was hemodynamically stable in no acute distress.   Reviewed and confirmed nursing documentation  for past medical history, family history, social history.    Initial Assessment/Plan:   This is a patient presenting with a moderate mechanism trauma.  As such, I have considered intracranial injuries including intracranial hemorrhage, intrathoracic injuries including blunt myocardial or blunt lung injury, blunt abdominal injuries including aortic dissection, bladder injury, spleen injury, liver injury and I have considered orthopedic injuries including extremity or spinal injury.    With the patient's presentation of moderate mechanism trauma but an otherwise reassuring exam, patient warrants targeted evaluation for potential traumatic injuries. Will proceed with targeted evaluation for  potential injuries. Will proceed with CTH. Objective evaluation resulted with NAA.   Final Reassessment and Plan:   Patient's history present on his physical exam findings are grossly reassuring.  No evidence of intracranial pathology on CT scan which was reviewed.  Given prolonged observation of 5 hours in the emergency department and resolution of symptoms, as there is no acute indication forFurther intervention in the emergency department.  Patient stable for outpatient care management with no acute indication for intervention. Given her use of Plavix, I discussed risk of delayed bleeding and patient expressed understanding of return precautions.  Disposition:  I have considered need for hospitalization, however, considering all of the above, I believe this patient is stable for discharge at this time.  Patient/family educated about specific return precautions for given chief complaint and symptoms.  Patient/family educated about follow-up with PCP.     Patient/family expressed understanding of return precautions and need for follow-up. Patient spoken to regarding all imaging and laboratory results and appropriate follow up for these results. All education provided in verbal form with additional information in written form. Time was allowed for answering of patient questions. Patient discharged.    Emergency Department Medication Summary:   Medications - No data to display        Clinical Impression: No diagnosis found.   Data Unavailable   Final Clinical Impression(s) / ED Diagnoses Final diagnoses:  None    Rx / DC Orders ED Discharge Orders     None         Amanda Sciara, MD 08/08/22 1946

## 2022-08-08 NOTE — ED Notes (Signed)
Charge RN notified that pt is fall on Plavix with hit to head.

## 2022-08-17 DIAGNOSIS — J40 Bronchitis, not specified as acute or chronic: Secondary | ICD-10-CM | POA: Diagnosis not present

## 2022-08-17 DIAGNOSIS — J329 Chronic sinusitis, unspecified: Secondary | ICD-10-CM | POA: Diagnosis not present

## 2022-08-17 DIAGNOSIS — R059 Cough, unspecified: Secondary | ICD-10-CM | POA: Diagnosis not present

## 2022-10-25 DIAGNOSIS — C4441 Basal cell carcinoma of skin of scalp and neck: Secondary | ICD-10-CM | POA: Diagnosis not present

## 2022-12-01 DIAGNOSIS — M545 Low back pain, unspecified: Secondary | ICD-10-CM | POA: Diagnosis not present

## 2022-12-03 DIAGNOSIS — M5416 Radiculopathy, lumbar region: Secondary | ICD-10-CM | POA: Diagnosis not present

## 2022-12-06 DIAGNOSIS — M5451 Vertebrogenic low back pain: Secondary | ICD-10-CM | POA: Diagnosis not present

## 2022-12-20 DIAGNOSIS — M5451 Vertebrogenic low back pain: Secondary | ICD-10-CM | POA: Diagnosis not present

## 2023-01-10 DIAGNOSIS — M5451 Vertebrogenic low back pain: Secondary | ICD-10-CM | POA: Diagnosis not present

## 2023-03-22 DIAGNOSIS — B353 Tinea pedis: Secondary | ICD-10-CM | POA: Diagnosis not present

## 2023-03-22 DIAGNOSIS — D0462 Carcinoma in situ of skin of left upper limb, including shoulder: Secondary | ICD-10-CM | POA: Diagnosis not present

## 2023-05-16 DIAGNOSIS — R8290 Unspecified abnormal findings in urine: Secondary | ICD-10-CM | POA: Diagnosis not present

## 2023-05-16 DIAGNOSIS — N1831 Chronic kidney disease, stage 3a: Secondary | ICD-10-CM | POA: Diagnosis not present

## 2023-05-16 DIAGNOSIS — F331 Major depressive disorder, recurrent, moderate: Secondary | ICD-10-CM | POA: Diagnosis not present

## 2023-05-16 DIAGNOSIS — E538 Deficiency of other specified B group vitamins: Secondary | ICD-10-CM | POA: Diagnosis not present

## 2023-05-16 DIAGNOSIS — E782 Mixed hyperlipidemia: Secondary | ICD-10-CM | POA: Diagnosis not present

## 2023-05-16 DIAGNOSIS — Z0000 Encounter for general adult medical examination without abnormal findings: Secondary | ICD-10-CM | POA: Diagnosis not present

## 2023-05-16 DIAGNOSIS — E559 Vitamin D deficiency, unspecified: Secondary | ICD-10-CM | POA: Diagnosis not present

## 2023-05-16 DIAGNOSIS — I10 Essential (primary) hypertension: Secondary | ICD-10-CM | POA: Diagnosis not present

## 2023-05-16 DIAGNOSIS — R55 Syncope and collapse: Secondary | ICD-10-CM | POA: Diagnosis not present

## 2023-07-10 DIAGNOSIS — N1832 Chronic kidney disease, stage 3b: Secondary | ICD-10-CM | POA: Diagnosis not present

## 2023-07-11 DIAGNOSIS — R519 Headache, unspecified: Secondary | ICD-10-CM | POA: Diagnosis not present

## 2023-07-11 DIAGNOSIS — N1832 Chronic kidney disease, stage 3b: Secondary | ICD-10-CM | POA: Diagnosis not present

## 2023-07-11 DIAGNOSIS — R001 Bradycardia, unspecified: Secondary | ICD-10-CM | POA: Diagnosis not present

## 2023-07-11 DIAGNOSIS — G8929 Other chronic pain: Secondary | ICD-10-CM | POA: Diagnosis not present

## 2023-07-11 DIAGNOSIS — N281 Cyst of kidney, acquired: Secondary | ICD-10-CM | POA: Diagnosis not present

## 2023-07-11 DIAGNOSIS — R809 Proteinuria, unspecified: Secondary | ICD-10-CM | POA: Diagnosis not present

## 2023-07-11 DIAGNOSIS — I130 Hypertensive heart and chronic kidney disease with heart failure and stage 1 through stage 4 chronic kidney disease, or unspecified chronic kidney disease: Secondary | ICD-10-CM | POA: Diagnosis not present

## 2023-07-11 DIAGNOSIS — I5032 Chronic diastolic (congestive) heart failure: Secondary | ICD-10-CM | POA: Diagnosis not present

## 2023-08-11 DIAGNOSIS — H353112 Nonexudative age-related macular degeneration, right eye, intermediate dry stage: Secondary | ICD-10-CM | POA: Diagnosis not present

## 2024-07-03 ENCOUNTER — Encounter: Payer: Self-pay | Admitting: Oncology
